# Patient Record
Sex: Female | Born: 1935 | Race: Black or African American | Hispanic: No | Marital: Married | Smoking: Former smoker
Health system: Southern US, Community
[De-identification: ages and names within clinical notes are randomized; demographics above are authoritative.]

## PROBLEM LIST (undated history)

## (undated) DIAGNOSIS — D649 Anemia, unspecified: Secondary | ICD-10-CM

## (undated) DIAGNOSIS — I10 Essential (primary) hypertension: Secondary | ICD-10-CM

## (undated) DIAGNOSIS — E119 Type 2 diabetes mellitus without complications: Secondary | ICD-10-CM

## (undated) DIAGNOSIS — E039 Hypothyroidism, unspecified: Secondary | ICD-10-CM

## (undated) DIAGNOSIS — R002 Palpitations: Secondary | ICD-10-CM

## (undated) DIAGNOSIS — K219 Gastro-esophageal reflux disease without esophagitis: Secondary | ICD-10-CM

## (undated) DIAGNOSIS — K279 Peptic ulcer, site unspecified, unspecified as acute or chronic, without hemorrhage or perforation: Secondary | ICD-10-CM

## (undated) DIAGNOSIS — M109 Gout, unspecified: Secondary | ICD-10-CM

## (undated) HISTORY — PX: BUNIONECTOMY: SHX129

## (undated) HISTORY — PX: THROAT SURGERY: SHX803

## (undated) HISTORY — PX: THYROIDECTOMY: SHX17

## (undated) HISTORY — DX: Palpitations: R00.2

## (undated) HISTORY — DX: Anemia, unspecified: D64.9

## (undated) HISTORY — PX: OTHER SURGICAL HISTORY: SHX169

## (undated) HISTORY — DX: Gastro-esophageal reflux disease without esophagitis: K21.9

## (undated) HISTORY — PX: HEMORRHOID SURGERY: SHX153

## (undated) HISTORY — DX: Gout, unspecified: M10.9

## (undated) HISTORY — DX: Type 2 diabetes mellitus without complications: E11.9

## (undated) HISTORY — PX: UPPER GASTROINTESTINAL ENDOSCOPY: SHX188

## (undated) HISTORY — DX: Peptic ulcer, site unspecified, unspecified as acute or chronic, without hemorrhage or perforation: K27.9

## (undated) HISTORY — DX: Hypothyroidism, unspecified: E03.9

## (undated) HISTORY — PX: OVARIAN CYST REMOVAL: SHX89

---

## 1999-03-17 ENCOUNTER — Emergency Department (HOSPITAL_COMMUNITY): Admission: EM | Admit: 1999-03-17 | Discharge: 1999-03-17 | Payer: Self-pay | Admitting: Emergency Medicine

## 1999-06-15 ENCOUNTER — Emergency Department (HOSPITAL_COMMUNITY): Admission: EM | Admit: 1999-06-15 | Discharge: 1999-06-15 | Payer: Self-pay | Admitting: Emergency Medicine

## 1999-12-26 ENCOUNTER — Other Ambulatory Visit: Admission: RE | Admit: 1999-12-26 | Discharge: 1999-12-26 | Payer: Self-pay | Admitting: *Deleted

## 2002-01-08 ENCOUNTER — Encounter: Payer: Self-pay | Admitting: Orthopedic Surgery

## 2002-01-08 ENCOUNTER — Encounter: Admission: RE | Admit: 2002-01-08 | Discharge: 2002-01-08 | Payer: Self-pay | Admitting: Orthopedic Surgery

## 2004-02-23 ENCOUNTER — Inpatient Hospital Stay (HOSPITAL_COMMUNITY): Admission: EM | Admit: 2004-02-23 | Discharge: 2004-02-27 | Payer: Self-pay | Admitting: Emergency Medicine

## 2005-05-22 ENCOUNTER — Encounter: Admission: RE | Admit: 2005-05-22 | Discharge: 2005-05-22 | Payer: Self-pay | Admitting: Internal Medicine

## 2005-06-21 ENCOUNTER — Encounter
Admission: RE | Admit: 2005-06-21 | Discharge: 2005-07-24 | Payer: Self-pay | Admitting: Physical Medicine and Rehabilitation

## 2006-04-10 ENCOUNTER — Emergency Department (HOSPITAL_COMMUNITY): Admission: EM | Admit: 2006-04-10 | Discharge: 2006-04-10 | Payer: Self-pay | Admitting: Emergency Medicine

## 2007-02-07 ENCOUNTER — Emergency Department (HOSPITAL_COMMUNITY): Admission: EM | Admit: 2007-02-07 | Discharge: 2007-02-07 | Payer: Self-pay | Admitting: *Deleted

## 2007-04-14 ENCOUNTER — Emergency Department (HOSPITAL_COMMUNITY): Admission: EM | Admit: 2007-04-14 | Discharge: 2007-04-14 | Payer: Self-pay | Admitting: Emergency Medicine

## 2007-06-16 ENCOUNTER — Encounter: Admission: RE | Admit: 2007-06-16 | Discharge: 2007-06-16 | Payer: Self-pay | Admitting: Internal Medicine

## 2009-03-25 ENCOUNTER — Observation Stay (HOSPITAL_COMMUNITY): Admission: EM | Admit: 2009-03-25 | Discharge: 2009-03-26 | Payer: Self-pay | Admitting: Emergency Medicine

## 2009-05-10 ENCOUNTER — Encounter: Admission: RE | Admit: 2009-05-10 | Discharge: 2009-06-16 | Payer: Self-pay | Admitting: Internal Medicine

## 2009-11-15 ENCOUNTER — Encounter: Admission: RE | Admit: 2009-11-15 | Discharge: 2009-11-15 | Payer: Self-pay | Admitting: Internal Medicine

## 2009-11-22 ENCOUNTER — Ambulatory Visit: Payer: Self-pay | Admitting: Gynecology

## 2009-11-22 ENCOUNTER — Other Ambulatory Visit: Admission: RE | Admit: 2009-11-22 | Discharge: 2009-11-22 | Payer: Self-pay | Admitting: Gynecology

## 2009-11-29 ENCOUNTER — Ambulatory Visit: Payer: Self-pay | Admitting: Gynecology

## 2010-05-10 ENCOUNTER — Emergency Department (HOSPITAL_COMMUNITY): Admission: EM | Admit: 2010-05-10 | Discharge: 2010-05-11 | Payer: Self-pay | Admitting: Emergency Medicine

## 2010-06-29 ENCOUNTER — Emergency Department (HOSPITAL_COMMUNITY): Admission: EM | Admit: 2010-06-29 | Discharge: 2010-06-29 | Payer: Self-pay | Admitting: Emergency Medicine

## 2011-01-15 LAB — DIFFERENTIAL
Basophils Absolute: 0 10*3/uL (ref 0.0–0.1)
Basophils Relative: 0 % (ref 0–1)
Eosinophils Absolute: 0.1 10*3/uL (ref 0.0–0.7)
Eosinophils Relative: 1 % (ref 0–5)
Lymphocytes Relative: 8 % — ABNORMAL LOW (ref 12–46)
Monocytes Absolute: 0.1 10*3/uL (ref 0.1–1.0)

## 2011-01-15 LAB — CBC
HCT: 41.3 % (ref 36.0–46.0)
Hemoglobin: 14.2 g/dL (ref 12.0–15.0)
MCHC: 34.3 g/dL (ref 30.0–36.0)
MCV: 88.2 fL (ref 78.0–100.0)
Platelets: 220 10*3/uL (ref 150–400)
RDW: 14.5 % (ref 11.5–15.5)

## 2011-01-15 LAB — BASIC METABOLIC PANEL
BUN: 17 mg/dL (ref 6–23)
CO2: 23 mEq/L (ref 19–32)
Glucose, Bld: 100 mg/dL — ABNORMAL HIGH (ref 70–99)
Potassium: 4.3 mEq/L (ref 3.5–5.1)
Sodium: 143 mEq/L (ref 135–145)

## 2011-02-20 NOTE — Discharge Summary (Signed)
NAME:  Julie [name], Julie [name]                 ACCOUNT NO.:  [id number]   MEDICAL RECORD NO.:  [id number]          PATIENT TYPE:  OBV   LOCATION:  1312                         FACILITY:  [facility]   PHYSICIAN:  Massie Maroon, MD        DATE OF BIRTH:  24-Aug-1935   DATE OF ADMISSION:  03/25/2009  DATE OF DISCHARGE:  03/26/2009                               DISCHARGE SUMMARY   DISCHARGE DIAGNOSES:  1. 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  2. Hypertension.  3. Hypothyroidism.  4. Gout/hyperuricemia.  5. Vitamin D deficiency.  6. Asthma.  7. Peripheral edema.  8. Osteoarthritis.  9. Degenerative joint disease.  10.History of abnormal liver function tests likely secondary to Amrix.   ALLERGIES:  ACE INHIBITORS CAUSE TONGUE SWELLING, ANGIOEDEMA.  PROCARDIA  CAUSES NERVOUSNESS.  FISH CAUSES CONGESTION AND SWELLING.  AMRIX CAUSES  CHANGE IN URINE COLOR, LIVER FUNCTION ABNORMALITIES.  ADVAIR CAUSES  BLURRED VISION.   DISCHARGE MEDICATIONS:  1. Hydrochlorothiazide 12.5 mg p.o. daily.  2. Amlodipine 5 mg p.o. daily.  3. Vitamin D 50,000 international units p.o. q.week.  4. Tramadol 50 mg p.o. b.i.d. p.r.n. pain.  5. Indomethacin 50 mg p.o. t.i.d. p.r.n. pain.  6. Ventolin HFA 2 puffs q.6 hours p.r.n. wheezing or shortness of      breath.   HOSPITAL COURSE:  A 76 year old female presented with chief complaint of  tongue swelling starting 30 minutes prior to coming to the ED.  She  notes that she had been taking amlodipine, benazepril starting about 5  days ago.  She had previously been on Exforge.  On recollection she  recalled that her former PCP had told her not to take lisinopril.  However she did not recall this until her tongue started swelling.  She  denied any shellfish or seafood or strawberry eating.  She can not  attribute her tongue swelling to any other medications.  The patient  noticed some slight shortness of breath initially but did not  show  obvious signs of stridor.  She was started on Solu-Medrol.  This was  later increased to 80 mg IV q.6 and Benadryl was also used to help  reduce the tongue swelling.  This morning she appears stable.  Her  tongue swelling is almost receded to normal.  The patient will be placed  on prednisone taper.  She will be discharged home.  She will be  discharged on hydrochlorothiazide as well as amlodipine to control her  blood pressure and she is to check her blood pressure daily.   Past medical history, past surgical history, family history, social  history:  Please see HPI on March 25, 2009.   PHYSICAL EXAMINATION:  Temperature 98, pulse 114, repeat pulse 90, blood  pressure 158/98, repeat blood pressure manually 145/90, pulse ox 96% on  room air.  HEENT:  Anicteric, EOMI, no nystagmus, pupils 1.5-mm and symmetric,  direct __________ reflex intact.  Mucous membranes moist, the swelling  almost down to normal, much reduced in size.  NECK:  No JVD, no stridor,  no bruit, no thyromegaly, no adenopathy.  HEART:  Regular rate and rhythm, S1 - S2.  No murmurs, gallops or rubs.  LUNGS:  Clear to auscultation bilaterally.  ABDOMEN:  Soft, obese, nontender, nondistended, positive bowel sounds.  EXTREMITIES:  No cyanosis, clubbing or edema.  NEURO:  Nonfocal.  SKIN:  No rashes.  LYMPH NODES:  No adenopathy.  PSYCH:  Alert and oriented x3.   LABS:  Sodium 143, potassium 4.3, chloride 113, bicarb 23, BUN 17,  creatinine 1.12.   WBC 8.6, hemoglobin 14.2, platelet count 220.   ASSESSMENT:  1. Angioedema secondary to angiotensin converting enzyme inhibitors.  2. Hypertension.  3. Hypothyroidism.  4. Hyperuricemia.   PLAN:  The patient will be placed on prednisone taper 60 mg p.o. daily  x2 days, then 50 mg p.o. daily x2 days, and then 40 mg p.o. daily x2  days, then 30 mg p.o. daily x2 days, and then 20 mg p.o. daily x2 days,  and then 10 mg p.o. daily x2 days.  No more ACE inhibitors.  The  patient  can be placed on Norvasc 5 mg p.o. daily along with hydrochlorothiazide  12.5 mg p.o. daily for blood pressure control.  The patient should check  her blood pressure daily and she will contact her PCP if it is not  controlled.  She will follow up with her PCP in 2 - 4 weeks.      Massie Maroon, MD  Electronically Signed     JYK/MEDQ  D:  03/26/2009  T:  03/26/2009  Job:  [id number]   cc:   Primary Care Diarra Ceja

## 2011-02-20 NOTE — H&P (Signed)
NAME:  Julie [name], Julie [name] NO.:  [id number]   MEDICAL RECORD NO.:  [id number]          PATIENT TYPE:  EMS   LOCATION:  ED                           FACILITY:  [facility]   PHYSICIAN:  Massie Maroon, MD        DATE OF BIRTH:  1935-02-23   DATE OF ADMISSION:  03/25/2009  DATE OF DISCHARGE:                              HISTORY & PHYSICAL   PRIMARY CARE PHYSICIAN:  Massie Maroon, MD.   CHIEF COMPLAINT:  Tongue swelling.   HISTORY OF PRESENT ILLNESS:  A 76 year old female with a history of  hypertension, hypothyroidism, complains of tongue swelling starting 30  minutes prior to coming to the emergency room.  The patient notes that  she started amlodipine/benazepril earlier this week due to cost  considerations.  She was previously on Exforge.  The patient denies any  shellfish or seafood or strawberry-eating.  She cannot attribute any  other medication changes to her tongue swelling.  The patient noted some  slight shortness of breath but does not show any obvious signs of  stridor.  The patient will be admitted for observation due to tongue  swelling/angioedema.   PAST MEDICAL HISTORY:  1. Hypertension.  2. Hypothyroidism.  3. Vertigo.  4. Gout.  5. Degenerative with disease.  6. Osteoarthritis.  7. Peripheral edema.  8. Asthma.  9. Hyperuricemia.  10.Vitamin D deficiency.  11.Abnormal liver function tests.   PAST SURGICAL HISTORY:  In 1997 left total knee replacement.  Thyroidectomy for cold thyroid nodule. Late 1980s bilateral foot surgery  for bunion removal and hammer toes, left wrist ganglion cyst removal. I  and D of infected for furuncle on her sacrum in the 1990s by Dr. Earlene Plater.  Right ovarian cyst removal at the age of 32.   FAMILY HISTORY:  Father died at age 58 of unknown causes.  Mother died  at age 35 and had diabetes, heart trouble, hypertension and asthma.  Siblings:  She has 1 brother in good health.  One sister with diabetes,  hypertension, and kidney  disease.  The patient is married and has no  children.  Maternal great aunt had diabetes and a cousin with breast  cancer.  Several other family members had heart disease.   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  The patient does not smoke or drink.   ALLERGIES:  PROCARDIA causes nervousness.  FISH causes congestion and  swelling. __________ causes change in urine color ? liver function  abnormalities. ADVAIR causes blurred vision, and BENAZEPRIL/AMLODIPINE  causes tongue swelling.   MEDICATIONS:  Amlodipine/benazepril 5/20 mg 1 p.o. daily, tramadol 50 mg  p.o. b.i.d. for arthritis pain, indomethacin 50 mg p.o. t.i.d. p.r.n.  gout. Vitamin D 50,000 international units weekly. Synthroid 50 mcg p.o.  daily. Omega-3 fish oil 1-3 daily. Celery seed 2-3 cups daily. Benadryl  25 mg 1-2 daily p.r.n. (The patient was previously previous on Exforge  10/160 mg p.o. daily with good blood pressure control.)   PHYSICAL EXAMINATION:  VITAL SIGNS:  Temperature 98, pulse 75, blood  pressure 148/87, pulse ox 96% on room air.  HEENT: Anicteric, marked tongue swelling.  Mucous membranes moist.  Tongue midline.  NECK:  No JVD, no bruit, no thyromegaly, no stridor. Evidence of prior  thyroidectomy scar.  HEART:  Regular rate and rhythm.  S1-S2. No murmurs, gallops or rubs.  LUNGS: Clear to auscultation bilaterally.  ABDOMEN: Soft, nontender, nondistended.  Positive bowel sounds.  EXTREMITIES: No cyanosis, clubbing or edema.  NEUROLOGICAL:  Nonfocal, cranial nerves II-XII intact, reflexes 2+,  symmetric, diffuse with downgoing toes bilaterally. Motor strength 5/5  in all 4 extremities, pinprick intact.   LABS:  WBC 8.6, hemoglobin 14.2, platelet count 220.  Sodium 143,  potassium 4.3, chloride 113, bicarb 23, BUN 17, creatinine 1.12.   ASSESSMENT:  1. Angioedema:  We will use Solu-Medrol 80 mg IV q.8 h. and then      convert over to prednisone  taper.  She does not appear to have any      evidence of stridor or airway compromise.  We will admit her to the      medical floor.  2. Hypertension:  We will discontinue amlodipine/benazepril.  We have      placed into her chart a warning that __________ cause angioedema.      For blood pressure control we will continue with and amlodipine and      consider readmission of Diovan at a later time.  3. Hypothyroidism:  The patient will continue on Synthroid 50 mcg p.o.      daily.  4. Hyperuricemia/osteoarthritis:  We will use tramadol 50 mg p.o.      b.i.d. p.r.n. for pain control.  5. Deep vein thrombosis prophylaxis:  SCDs plus TEDs.      Massie Maroon, MD  Electronically Signed     JYK/MEDQ  D:  03/25/2009  T:  03/25/2009  Job:  [id number]   cc:   Massie Maroon, MD  Fax: [phone]

## 2011-02-23 NOTE — Discharge Summary (Signed)
NAME:  Julie [name], Julie [name] NO.:  [id number]   MEDICAL RECORD NO.:  [id number]                   PATIENT TYPE:  INP   LOCATION:  0346                                 FACILITY:  [facility]   PHYSICIAN:  Elliot Cousin, M.D.                 DATE OF BIRTH:  [birth year]   DATE OF ADMISSION:  02/23/2004  DATE OF DISCHARGE:                                 DISCHARGE SUMMARY   DISCHARGE DIAGNOSES:  1. Gastrointestinal bleed.     a. Esophagogastroduodenoscopy on Feb 24, 2004 per Dr. Virginia Rochester was        unremarkable.     b. Colonoscopy on Feb 24, 2004 per Dr. Virginia Rochester revealed blood in the colon,        mostly in the sigmoid colon, with some small blood clots.  No active        bleeding site seen.  The etiology of the patient's bleeding is really        undetermined at this time.  2. Normocytic anemia.  Hemoglobin on admission 13.1; hemoglobin at the time     of discharge 11.7.   SECONDARY DISCHARGE DIAGNOSES:  1. Hypertension.  2. Hypothyroidism.  3. Gout.  4. Degenerative joint disease primarily of the lower back, right-greater-     than-left hip, and right-greater-than-left shoulder.  5. Status post left total knee replacement in 1997.  6. Status post right ovarian cyst removal at the age of 76 years old.  7. Status post thyroidectomy in the late 1980s.  8. Status post bilateral foot surgery for bunion removal and hammertoes.  9. Status post left wrist cyst excision several years ago.  10.      Status post incision and drainage of an infected furuncle on her     sacrum in the 1990s per Dr. Earlene Plater.  11.      Mild peripheral edema.   DISCHARGE MEDICATIONS:  1. Anusol HC suppository one at bedtime.  2. Niferex 150 mg daily.  3. Norvasc 10 mg daily.  4. Diovan 80 mg daily.  5. Darvocet-N 100 one to two tablets q.6h. as needed for pain.  6. Allopurinol 100 mg b.i.d.  7. Furosemide 20 mg daily.  8. Synthroid 50 mcg daily.  9. AVOID ALL NONSTEROIDAL ANTIINFLAMMATORY  DRUGS.   DISCHARGE DISPOSITION:  The patient was discharged to home on Feb 27, 2004  in improved and stable condition.  She was advised to follow up with her  primary care physician, Dr. Lendell Caprice, in 3-5 days.  Will advise a repeat of  her hemoglobin and hematocrit at the hospital follow-up appointment.  She  will follow up with Dr. Virginia Rochester as needed and per Dr. Pincus Sanes discretion.   PROCEDURES PERFORMED:  EGD and colonoscopy on Feb 24, 2004 per Dr. Virginia Rochester.  The  results as above.   CONSULTATIONS:  Sabino Gasser, M.D.   HISTORY OF PRESENT ILLNESS:  [name] is a 76 year old lady with a past  medical history significant for hypertension and degenerative joint disease  as well as hypothyroidism who presented to the emergency department on Feb 23, 2004 with an episode of rectal bleeding.  The patient takes Aleve  several times a day for arthritic pain in her back and shoulders.  She  denied any abdominal pain, nausea, vomiting, or hematemesis.  When she was  examined by the emergency department physician, Dr. Freida Busman, he noted that the  patient had gross maroon-colored and bright-red-colored stool on his  examining glove.  The patient was therefore admitted for further evaluation  and management.   HOSPITAL COURSE:  GASTROINTESTINAL BLEED.  The patient's hemoglobin on  admission was 13.1, hematocrit was 40.2, and MCV was 85.9.  The patient was  hemodynamically stable with a blood pressure on admission of 142/92.  She  was oxygenating 97% on room air.  The initial management included gentle  volume repletion with normal saline at 50 mL/hour, Protonix 40 mg daily, and  discontinuing all NSAIDs including Aleve.  The patient's PT was within  normal limits at 12.4 and the PTT was within normal limits at 26.  The  patient was typed and screened for 1 unit of packed red blood cells.  The  patient's hemoglobin and hematocrit were followed daily.  Dr. Virginia Rochester, the  gastroenterologist, was consulted for  further evaluation and management.  Dr. Virginia Rochester subsequently performed an EGD and colonoscopy on Feb 24, 2004.  The  EGD was unremarkable.  However, the colonoscopy revealed blood in the colon,  mostly in the sigmoid colon, with some blood clots but no active bleeding  site was seen.  The etiology of the patient's bleeding was indeterminate.  It may or may not have been internal hemorrhoids per Dr. Virginia Rochester.  Following the  colonoscopy, a bleeding scan was considered if the patient's rectal bleeding  worsened or did not improve.  Over the course of the hospitalization the  patient's hemoglobin fell from 13.1 to 12.2 the following day, to 11.5 the  following day, to 10.5 on yesterday.  The patient continued to have small  bowel movements but only scant amount of frank blood or maroon-colored blood  that was visible in her stool.  On today, her hemoglobin is 11.7.  She had  two small bowel movements this morning with only a scant amount of maroon-  colored blood.  The patient currently is hemodynamically stable with a blood  pressure of 143/83.  Given that the bleeding is substantially slowing down  and given that her hemoglobin has remained stable over the past 24 hours,  the patient will be discharged to home.   The patient was advised to avoid all NSAIDs.  She was advised to take  Darvocet as needed for arthritic pain.  She was advised to follow up with  Dr. Lendell Caprice in 3-5 days to repeat an assessment of the hemoglobin and  hematocrit.  She was advised to come back to the emergency department if an  increase in rectal bleeding occurred.  She will follow up with Dr. Virginia Rochester as  needed.                                               Elliot Cousin, M.D.    DF/MEDQ  D:  02/27/2004  T:  02/28/2004  Job:  [id number]   cc:   Janae Bridgeman. Eloise Harman., M.D.  [address]  [place]  [place] [address]  Fax: [phone]   Georgiana Spinner, M.D.  [address] [place]  [place] [address]   Fax: [phone]

## 2011-02-23 NOTE — Op Note (Signed)
NAME:  MODEST, [name] NO.:  [id number]   MEDICAL RECORD NO.:  [id number]                   PATIENT TYPE:  INP   LOCATION:  0346                                 FACILITY:  [facility]   PHYSICIAN:  Georgiana Spinner, M.D.                 DATE OF BIRTH:  [birth year]   DATE OF PROCEDURE:  DATE OF DISCHARGE:                                 OPERATIVE REPORT   PROCEDURE:  Colonoscopy.   ENDOSCOPIST:  Georgiana Spinner, M.D.   INDICATIONS:  Rectal bleeding.   ANESTHESIA:  Demerol 20 mg, Versed 2 mg.   PROCEDURE:  With the patient mildly sedated in the left lateral decubitus  position, the Olympus videoscopic colonoscope was inserted in the rectum and  passed under direct vision to the cecum.  It was noted that in the sigmoid  colon area, there was dilute red blood that ended at approximately 5 cm from  the anal verge and the proximal portion of the colon had absolutely no blood  or blood-staining seen.  The cecum was identified by the ileocecal valve and  appendiceal orifice, both of which were photographed.  From this point, the  colonoscope was slowly withdrawn, taking circumferential views of the  colonic mucosa, stopping at approximately 50 and then subsequently distal to  approximately 20 cm from the anal verge as we washed and suctioned blood  from this area, looking for an active bleeding site, none of which was seen.  There were no diverticula appreciated on this examination.  There were no  AVMs, certainly no polyps or other mass lesions.  The endoscope was then  withdrawn back to the rectum, which appeared normal on direct and showed  hemorrhoids on retroflexed view which were not bleeding.  The endoscope was  straightened and pulled back through the anal canal; external hemorrhoids  were noted, again no active bleeding seen here.   IMPRESSION:  Blood in the colon, mostly in the sigmoid colon, with some  small clots, no active bleeding site seen.  The  etiology of the patient's  bleeding is really undetermined at this time, but would follow clinically.  Consider bleeding scan if the patient should have repeat clinical bleeding.      ___________________________________________                                            Georgiana Spinner, M.D.   GMO/MEDQ  D:  02/24/2004  T:  02/25/2004  Job:  [id number]

## 2011-02-23 NOTE — H&P (Signed)
NAME:  Julie [name], SACCO NO.:  [id number]   MEDICAL RECORD NO.:  [id number]                   PATIENT TYPE:  EMS   LOCATION:  ED                                   FACILITY:  [facility]   PHYSICIAN:  Elliot Cousin, M.D.                 DATE OF BIRTH:  01/30/1935   DATE OF ADMISSION:  02/23/2004  DATE OF DISCHARGE:                                HISTORY & PHYSICAL   PRIMARY CARE PHYSICIAN:  Dr. Dimas Alexandria.   CHIEF COMPLAINT:  Rectal bleeding.   HISTORY OF PRESENT ILLNESS:  Julie [name] is a 76 year old lady with a past  medical history significant for hypertension, hypothyroidism, and  degenerative joint disease who presented to the emergency department this  morning with an episode of rectal bleeding.  The patient states that she got  up this morning at approximately 3 a.m. to use the bathroom.  When she wiped  her rectum she saw blood on the toilet paper.  She then looked into the  toilet water and saw a bowl full of blood mixed in with water.  There was a  scant amount of stool.  The patient had a bowel movement earlier in the day  without any evidence of bright red blood per rectum or maroon-colored  stools.  The patient says this is the first time that she has had rectal  bleeding.  She has not had any recent constipation, abdominal pain, or  diarrhea.  Occasionally she will have some burping, heartburn, and nausea.  She has not had any vomiting.  She has not had any painful urination.  She  has not had any blood in her urine or abnormal uterine bleeding.  She denies  any alcohol use; however, she has been taking more Aleve lately,  approximately three to four pills a day for the past few weeks, primarily  for arthritic pain in her back and shoulders.   When the patient was evaluated by emergency department physician Dr. Freida Busman,  per his rectal exam, the patient had gross maroon-colored stool on his  examining glove.  Her hemoglobin in the emergency  department was 13.1.  The  patient will be admitted for further evaluation and management.   PAST MEDICAL HISTORY:  1. Hypertension.  2. Hypothyroidism.  3. Gout.  4. Degenerative joint disease, primarily of the lower back, right-greater-     than-left hip, and right-greater-than-left shoulder.  5. Status post left total knee replacement in 1997.  6. Status post right ovarian cyst removal at the age of 76 years old.  7. Status post thyroidectomy in the late 1980s.  8. Status post bilateral foot surgery for bunion removal and hammertoes.  9. Status post left wrist cyst excision several years ago.  10.      Status post I&D of an infected furuncle on her sacrum in the 1990s     per Dr.  Davis.  11.      Mild peripheral edema.   MEDICATIONS:  1. Norvasc 10 mg daily.  2. Diovan 80 mg daily.  3. Darvocet one to two tablets q.6h. p.r.n. pain.  4. Aleve one to two tablets q.4-6h. as needed for pain.  5. Allopurinol 100 mg b.i.d.  6. Furosemide 20 mg daily.  7. Synthroid 50 mcg daily.   ALLERGIES:  No known drug allergies.   SOCIAL HISTORY:  The patient is married and lives in [place].  She has no  children.  She is retired.  She does not smoke, drink alcohol, or use  illicit drugs.  She can read and write.   FAMILY HISTORY:  Her father died at 29 years of age, cause unknown.  Her  mother died at 65 years of age, cause unknown; however, her mother did have  diabetes mellitus.   REVIEW OF SYSTEMS:  As above in the history of present illness.  In  addition, the patient denies any headache, weight loss, blackout, visual  changes, difficulty swallowing, chest pain, shortness of breath, abdominal  pain.   PHYSICAL EXAMINATION:  VITAL SIGNS:  Temperature 97.5, blood pressure  146/92, pulse 87, respiratory rate 18, oxygen saturation 97%.  GENERAL:  The patient is a pleasant 76 year old obese African-American woman  who is sitting up in bed in no acute distress.  HEENT:  Head is  normocephalic, nontraumatic.  Pupils are equal, round,  reactive to light.  Extraocular movements are intact.  Conjunctivae are  clear, sclerae are white.  Tympanic membranes are clear bilaterally.  Nasal  mucosa is moist, no drainage.  Oropharynx reveals good dentition.  Moist  mucous membranes.  No posterior exudates or erythema.  NECK:  Supple, mildly obese, no thyromegaly, no bruit, no JVD.  There is a  well-healed horizontal scar.  LUNGS:  Clear to auscultation bilaterally.  HEART:  S1, S2 with no murmurs, rubs, or gallops.  ABDOMEN:  Obese, positive bowel sounds, soft, nontender, nondistended, no  hepatosplenomegaly, no masses palpated.  RECTAL:  Per the emergency department physician, Dr. Freida Busman, the patient had  gross maroon-colored blood on the examining glove.  GENITOURINARY:  Deferred.  EXTREMITIES:  No pretibial edema.  No pedal edema.  Pedal pulses bilaterally  are 2+.  The patient has a good range of motion of all of her extremities.  MUSCULOSKELETAL:  Mild tenderness of the lumbosacral muscles bilaterally.  Mild tenderness over the right greater than the left shoulder joints.  Well-  healed scar on the left knee.   ADMISSION LABORATORY DATA:  WBC 8.0, hemoglobin 13.1, hematocrit 40.2, MCV  85.9, platelets 259.  Urinalysis negative.  PT 12.4, INR 0.9, PTT 26.  Sodium 141, potassium 3.9, chloride 109, CO2 24, glucose 107, BUN 19,  creatinine 1.4, calcium 8.9, total protein 7.2, albumin 3.8, AST 20, ALT 12,  alkaline phosphatase 116, total bilirubin 0.5.   ASSESSMENT:  Rectal bleeding.  The patient has had only one episode of  rectal bleeding.  She is currently hemodynamically stable and her hemoglobin  and hematocrit are within normal limits.  The patient will be admitted for  observation for further rectal bleeding.  If rectal bleeding recurs and/or  if she becomes hemodynamically unstable will obtain a GI consult.  The patient has been using more Aleve/NSAIDs lately.   This could be the possible  reason for the rectal bleeding.  Will also need to rule out colon cancer  given the patient's age.   PLAN:  1. The patient has been typed and screened x1 unit.  2. The patient will be admitted for observation.  3. Will monitor bowel movements for further rectal bleeding.  4. Will treat with gentle IV fluids.  Continue antihypertensive for now but     will hold if blood pressure begins to drop.  5. Protonix 40 mg daily.  6. Discontinue the Aleve for now.  7. Will check a baseline EKG.                                               Elliot Cousin, M.D.    DF/MEDQ  D:  02/23/2004  T:  02/23/2004  Job:  [id number]   cc:   Janae Bridgeman. Eloise Harman., M.D.  [address]  [place]  [place] [address]  Fax: [phone]

## 2011-02-23 NOTE — Op Note (Signed)
NAME:  Julie [name], Julie [name] NO.:  [id number]   MEDICAL RECORD NO.:  [id number]                   PATIENT TYPE:  INP   LOCATION:  0346                                 FACILITY:  [facility]   PHYSICIAN:  Georgiana Spinner, M.D.                 DATE OF BIRTH:  Sep 23, 1935   DATE OF PROCEDURE:  DATE OF DISCHARGE:                                 OPERATIVE REPORT   PROCEDURE:  Upper endoscopy.   ENDOSCOPIST:  Georgiana Spinner, M.D.   INDICATIONS:  Rectal bleeding.   ANESTHESIA:  Demerol 40, Versed 4 mg.   DESCRIPTION OF PROCEDURE:  With the patient mildly sedated in the left  lateral decubitus position, the Olympus videoscopic endoscope was inserted  into the mouth and passed under direct vision through the esophagus which  appeared normal.  There was a hiatal hernia noted.  We entered into the  stomach, fundus, body antrum, duodenal bulb, and second portion of the  duodenum were unremarkable.   From this point, the endoscope was slowly withdrawn taking circumferential  views of the duodenal mucosa; visualized until the endoscope was then pulled  back into the stomach and placed in retroflexion to view the stomach from  below.  The endoscope was then straightened and withdrawn taking  circumferential views of the remaining gastric and esophageal mucosa.  The  patient's vital signs and pulse oximetry remained stable.  The patient  tolerated the procedure well without apparent complications.   FINDINGS:  Unremarkable examination.   PLAN:  Proceed to colonoscopy.      ___________________________________________                                            Georgiana Spinner, M.D.   GMO/MEDQ  D:  02/24/2004  T:  02/25/2004  Job:  [id number]

## 2011-09-11 ENCOUNTER — Emergency Department (HOSPITAL_COMMUNITY)
Admission: EM | Admit: 2011-09-11 | Discharge: 2011-09-11 | Disposition: A | Discharge status: Home / Self Care | Payer: Medicare Other | Attending: Emergency Medicine | Admitting: Emergency Medicine

## 2011-09-11 ENCOUNTER — Emergency Department (HOSPITAL_COMMUNITY): Payer: Medicare Other

## 2011-09-11 ENCOUNTER — Encounter: Payer: Self-pay | Admitting: Emergency Medicine

## 2011-09-11 DIAGNOSIS — R059 Cough, unspecified: Secondary | ICD-10-CM | POA: Insufficient documentation

## 2011-09-11 DIAGNOSIS — R0602 Shortness of breath: Secondary | ICD-10-CM | POA: Insufficient documentation

## 2011-09-11 DIAGNOSIS — H1130 Conjunctival hemorrhage, unspecified eye: Secondary | ICD-10-CM | POA: Insufficient documentation

## 2011-09-11 DIAGNOSIS — J40 Bronchitis, not specified as acute or chronic: Secondary | ICD-10-CM

## 2011-09-11 DIAGNOSIS — R05 Cough: Secondary | ICD-10-CM

## 2011-09-11 DIAGNOSIS — R42 Dizziness and giddiness: Secondary | ICD-10-CM | POA: Insufficient documentation

## 2011-09-11 HISTORY — DX: Essential (primary) hypertension: I10

## 2011-09-11 LAB — URINALYSIS, ROUTINE W REFLEX MICROSCOPIC
Bilirubin Urine: NEGATIVE
Glucose, UA: NEGATIVE mg/dL
Hgb urine dipstick: NEGATIVE
Ketones, ur: NEGATIVE mg/dL
Leukocytes, UA: NEGATIVE
Nitrite: NEGATIVE
Protein, ur: NEGATIVE mg/dL
Specific Gravity, Urine: 1.014 (ref 1.005–1.030)
Urobilinogen, UA: 0.2 mg/dL (ref 0.0–1.0)
pH: 5.5 (ref 5.0–8.0)

## 2011-09-11 LAB — POCT I-STAT, CHEM 8
Calcium, Ion: 1.15 mmol/L (ref 1.12–1.32)
Chloride: 109 mEq/L (ref 96–112)
Glucose, Bld: 92 mg/dL (ref 70–99)
HCT: 35 % — ABNORMAL LOW (ref 36.0–46.0)
TCO2: 26 mmol/L (ref 0–100)

## 2011-09-11 MED ORDER — SODIUM CHLORIDE 0.9 % IV BOLUS (SEPSIS): 500.0000 mL | Freq: Once | INTRAVENOUS | Status: DC

## 2011-09-11 MED ORDER — AMOXICILLIN 250 MG PO CAPS: 250.0000 mg | ORAL_CAPSULE | Freq: Two times a day (BID) | ORAL | Status: AC

## 2011-09-11 NOTE — ED Provider Notes (Signed)
History     CSN: [id number] Arrival date & time: 09/11/2011  6:38 AM   First MD Initiated Contact with Patient 09/11/11 [phone]      Chief Complaint  Patient presents with  . Dizziness  . URI    (Consider location/radiation/quality/duration/timing/severity/associated sxs/prior treatment) HPI Pt presented to the ER with c/o cold and dizziness, vertigo, states started on Tuesday after grandkids visit, both kids were coughing in her face, pt started to fee sick since then. Pt denies chills, fever, but states she is coughing a lot.  Patient denies chest pain.  Patient denies headache.  Has had a long history of episodes of vertigo.  Past Medical History  Diagnosis Date  . Hypertension   . Asthma     Past Surgical History  Procedure Date  . Ovarina cyst removal     ovarian cyst removal  . Foot surgery     History reviewed. No pertinent family history.  History  Substance Use Topics  . Smoking status: Former Games developer  . Smokeless tobacco: Not on file  . Alcohol Use: No    OB History    Grav Para Term Preterm Abortions TAB SAB Ect Mult Living                  Review of Systems Of systems negative except as noted in history of present illness Allergies  Aspirin  Home Medications  No current outpatient prescriptions on file.  BP 135/92  Pulse 91  Temp 98.9 F (37.2 C)  Resp 20  SpO2 94%  Physical Exam  Nursing note and vitals reviewed. Constitutional: She is oriented to person, place, and time. She appears well-developed and well-nourished. No distress.  HENT:  Head: Normocephalic and atraumatic.  Eyes: Pupils are equal, round, and reactive to light. Right conjunctiva has a hemorrhage.    Neck: Normal range of motion.  Cardiovascular: Normal rate and intact distal pulses.   Pulmonary/Chest: Effort normal. No respiratory distress. She has no wheezes. She has no rales.  Abdominal: Normal appearance. She exhibits no distension.  Musculoskeletal: Normal range of  motion.  Neurological: She is alert and oriented to person, place, and time. No cranial nerve deficit.  Skin: Skin is warm and dry. No rash noted.  Psychiatric: She has a normal mood and affect. Her behavior is normal.    ED Course  Procedures (including critical care time)   Labs Reviewed  I-STAT, CHEM 8   Dg Chest 2 View  09/11/2011  *RADIOLOGY REPORT*  Clinical Data: Cough, vertigo, shortness of breath  CHEST - 2 VIEW  Comparison: Chest x-ray of 04/10/2006  Findings: The lungs are clear.  Mediastinal contours appear normal. The heart is within upper limits of normal.  No bony abnormality is seen with degenerative change throughout the thoracic spine. Surgical clips are present in the lower neck possibly due to prior thyroid surgery.  IMPRESSION:  Stable chest x-ray.  No active lung disease.  Heart upper normal.  Original Report Authenticated By: Juline Patch, M.D.     1. Cough   2. Bronchitis       MDM          Nelia Shi, MD 09/11/11 [phone]

## 2011-09-11 NOTE — ED Notes (Signed)
Pt in radiology 

## 2011-09-11 NOTE — ED Notes (Signed)
Pt presented to the ER with c/o cold and dizziness, vertigo, states started on Tuesday after grandkids visit, both kids were coughing in her face, pt started to fee sick since then.  Pt denies chills, fever, but states she is coughing a lot, (right eye with few burst blood vessels)

## 2012-04-15 ENCOUNTER — Encounter (HOSPITAL_COMMUNITY): Payer: Self-pay | Admitting: Emergency Medicine

## 2012-04-15 ENCOUNTER — Emergency Department (HOSPITAL_COMMUNITY)
Admission: EM | Admit: 2012-04-15 | Discharge: 2012-04-15 | Disposition: A | Discharge status: Home / Self Care | Payer: Medicare Other | Attending: Emergency Medicine | Admitting: Emergency Medicine

## 2012-04-15 DIAGNOSIS — J45909 Unspecified asthma, uncomplicated: Secondary | ICD-10-CM | POA: Insufficient documentation

## 2012-04-15 DIAGNOSIS — I10 Essential (primary) hypertension: Secondary | ICD-10-CM | POA: Insufficient documentation

## 2012-04-15 LAB — POCT I-STAT, CHEM 8
BUN: 20 mg/dL (ref 6–23)
Calcium, Ion: 1.18 mmol/L (ref 1.13–1.30)
Creatinine, Ser: 1.3 mg/dL — ABNORMAL HIGH (ref 0.50–1.10)
Hemoglobin: 13.3 g/dL (ref 12.0–15.0)
TCO2: 25 mmol/L (ref 0–100)

## 2012-04-15 NOTE — ED Provider Notes (Signed)
History     CSN: [id number]  Arrival date & time 04/15/12  1105   First MD Initiated Contact with Patient 04/15/12 1113      Chief Complaint  Patient presents with  . Hypertension    (Consider location/radiation/quality/duration/timing/severity/associated sxs/prior treatment) Patient is a 77 y.o. female presenting with hypertension. The history is provided by the patient.  Hypertension   patient here after having a hypertensive episode just prior to arrival which she treated with by taking an extra dose of her Norvasc. No associated chest pain, dyspnea, diaphoresis. No vomiting or confusion. She feels back to her baseline at this time. She has had trouble with blood pressure control recently  Past Medical History  Diagnosis Date  . Hypertension   . Asthma     Past Surgical History  Procedure Date  . Ovarina cyst removal     ovarian cyst removal  . Foot surgery     No family history on file.  History  Substance Use Topics  . Smoking status: Former Games developer  . Smokeless tobacco: Not on file  . Alcohol Use: No    OB History    Grav Para Term Preterm Abortions TAB SAB Ect Mult Living                  Review of Systems  All other systems reviewed and are negative.    Allergies  Aspirin  Home Medications   Current Outpatient Rx  Name Route Sig Dispense Refill  . AMLODIPINE BESYLATE 10 MG PO TABS Oral Take 10 mg by mouth daily.    . ASPIRIN 81 MG PO TABS Oral Take 81 mg by mouth daily.    Marland Kitchen COLCRYS PO Oral Take 0.6 mg by mouth daily.      Marland Kitchen HYDROCHLOROTHIAZIDE 12.5 MG PO TABS Oral Take 12.5 mg by mouth every morning.      . INDOMETHACIN 50 MG PO CAPS Oral Take 50 mg by mouth daily. gout     . LEVOTHYROXINE SODIUM 50 MCG PO TABS Oral Take 50 mcg by mouth every evening.     Marland Kitchen MECLIZINE HCL 25 MG PO TABS Oral Take 25 mg by mouth every 6 (six) hours as needed. vertigo    . TRAMADOL HCL 50 MG PO TABS Oral Take 50 mg by mouth 2 (two) times daily as needed. Maximum  dose= 8 tablets per day pain       BP 130/73  Pulse 89  Temp 99.1 F (37.3 C) (Oral)  Resp 14  SpO2 97%  Physical Exam  Nursing note and vitals reviewed. Constitutional: She is oriented to person, place, and time. She appears well-developed and well-nourished.  Non-toxic appearance. No distress.  HENT:  Head: Normocephalic and atraumatic.  Eyes: Conjunctivae, EOM and lids are normal. Pupils are equal, round, and reactive to light.  Neck: Normal range of motion. Neck supple. No tracheal deviation present. No mass present.  Cardiovascular: Normal rate, regular rhythm and normal heart sounds.  Exam reveals no gallop.   No murmur heard. Pulmonary/Chest: Effort normal and breath sounds normal. No stridor. No respiratory distress. She has no decreased breath sounds. She has no wheezes. She has no rhonchi. She has no rales.  Abdominal: Soft. Normal appearance and bowel sounds are normal. She exhibits no distension. There is no tenderness. There is no rebound and no CVA tenderness.  Musculoskeletal: Normal range of motion. She exhibits no edema and no tenderness.  Neurological: She is alert and oriented to person, place,  and time. She has normal strength. No cranial nerve deficit or sensory deficit. GCS eye subscore is 4. GCS verbal subscore is 5. GCS motor subscore is 6.  Skin: Skin is warm and dry. No abrasion and no rash noted.  Psychiatric: She has a normal mood and affect. Her speech is normal and behavior is normal.    ED Course  Procedures (including critical care time)  Labs Reviewed - No data to display No results found.   No diagnosis found.    MDM  Blood pressure stable here. She will followup with her Dr.        Toy Baker, MD 04/15/12 1257

## 2012-04-15 NOTE — ED Notes (Signed)
Pt here for c/o HTN bp 154/91 this am denies chest pain sob sweating and n/v took amlodipine 10 mg @1000  some dizziness

## 2013-03-18 ENCOUNTER — Encounter (HOSPITAL_COMMUNITY): Payer: Self-pay

## 2013-03-18 ENCOUNTER — Emergency Department (HOSPITAL_COMMUNITY)
Admission: EM | Admit: 2013-03-18 | Discharge: 2013-03-18 | Disposition: A | Discharge status: Home / Self Care | Payer: Medicare Other | Attending: Emergency Medicine | Admitting: Emergency Medicine

## 2013-03-18 DIAGNOSIS — J45909 Unspecified asthma, uncomplicated: Secondary | ICD-10-CM | POA: Insufficient documentation

## 2013-03-18 DIAGNOSIS — I10 Essential (primary) hypertension: Secondary | ICD-10-CM | POA: Insufficient documentation

## 2013-03-18 DIAGNOSIS — Z7982 Long term (current) use of aspirin: Secondary | ICD-10-CM | POA: Insufficient documentation

## 2013-03-18 DIAGNOSIS — Z79899 Other long term (current) drug therapy: Secondary | ICD-10-CM | POA: Insufficient documentation

## 2013-03-18 DIAGNOSIS — R42 Dizziness and giddiness: Secondary | ICD-10-CM | POA: Insufficient documentation

## 2013-03-18 DIAGNOSIS — Z87891 Personal history of nicotine dependence: Secondary | ICD-10-CM | POA: Insufficient documentation

## 2013-03-18 MED ORDER — LORAZEPAM 1 MG PO TABS
ORAL_TABLET | ORAL | Status: AC
  Administered 2013-03-18: 1 mg
  Filled 2013-03-18: qty 1

## 2013-03-18 MED ORDER — ONDANSETRON 8 MG PO TBDP
ORAL_TABLET | ORAL | Status: AC
  Administered 2013-03-18: 8 mg
  Filled 2013-03-18: qty 1

## 2013-03-18 NOTE — ED Notes (Addendum)
Per EMS, pt with vertigo starting 1 hour ago.  Nausea no vomiting. Pt states she took her med for it (meclizine) when occurred.  Marland Kitchen  Pt from home.  Vitals 154/79, ht 90, resp 18, 99%, CBG 120.

## 2013-03-18 NOTE — ED Notes (Signed)
[id number]<AV> Expected date:<BR> Expected time:<BR> Means of arrival:<BR> Comments:<BR> EMS/78 yo female with vertigo

## 2013-03-18 NOTE — ED Notes (Signed)
Patient stated that she is unable to get in a gown right now due to her feeling sick on the stomach. Will re-check

## 2013-03-30 NOTE — ED Provider Notes (Signed)
   History    CSN: [id number] Arrival date & time 03/18/13  0012  None    Chief Complaint  Patient presents with  . Dizziness   (Consider location/radiation/quality/duration/timing/severity/associated sxs/prior Treatment) HPI Comments: Down time chart  Past Medical History  Diagnosis Date  . Hypertension   . Asthma    Past Surgical History  Procedure Laterality Date  . Ovarina cyst removal      ovarian cyst removal  . Foot surgery     History reviewed. No pertinent family history. History  Substance Use Topics  . Smoking status: Former Games developer  . Smokeless tobacco: Not on file  . Alcohol Use: No   OB History   Grav Para Term Preterm Abortions TAB SAB Ect Mult Living                 Review of Systems  Allergies  Aspirin  Home Medications   Current Outpatient Rx  Name  Route  Sig  Dispense  Refill  . amLODipine (NORVASC) 10 MG tablet   Oral   Take 10 mg by mouth daily.         Marland Kitchen aspirin 81 MG tablet   Oral   Take 81 mg by mouth daily.         . indomethacin (INDOCIN) 50 MG capsule   Oral   Take 50 mg by mouth daily. gout          . levothyroxine (SYNTHROID, LEVOTHROID) 50 MCG tablet   Oral   Take 50 mcg by mouth every evening.          . meclizine (ANTIVERT) 25 MG tablet   Oral   Take 25 mg by mouth every 6 (six) hours as needed. vertigo         . traMADol (ULTRAM) 50 MG tablet   Oral   Take 50 mg by mouth 2 (two) times daily as needed. Maximum dose= 8 tablets per day pain           BP 122/61  Pulse 86  Temp(Src) 97.3 F (36.3 C) (Oral)  Resp 18  SpO2 94% Physical Exam  ED Course  Procedures (including critical care time) Labs Reviewed - No data to display No results found. No diagnosis found.  MDM  Down time chart  Arman Filter, NP 03/30/13 2015

## 2013-04-01 NOTE — ED Provider Notes (Signed)
Medical screening examination/treatment/procedure(s) were performed by non-physician practitioner and as supervising physician I was immediately available for consultation/collaboration.   Sudais Banghart, MD 04/01/13 2340 

## 2015-02-24 DIAGNOSIS — I10 Essential (primary) hypertension: Secondary | ICD-10-CM | POA: Diagnosis not present

## 2015-02-24 DIAGNOSIS — E119 Type 2 diabetes mellitus without complications: Secondary | ICD-10-CM | POA: Diagnosis not present

## 2015-03-03 DIAGNOSIS — M5136 Other intervertebral disc degeneration, lumbar region: Secondary | ICD-10-CM | POA: Diagnosis not present

## 2015-03-03 DIAGNOSIS — M25551 Pain in right hip: Secondary | ICD-10-CM | POA: Diagnosis not present

## 2015-03-03 DIAGNOSIS — E559 Vitamin D deficiency, unspecified: Secondary | ICD-10-CM | POA: Diagnosis not present

## 2015-03-03 DIAGNOSIS — I10 Essential (primary) hypertension: Secondary | ICD-10-CM | POA: Diagnosis not present

## 2015-03-03 DIAGNOSIS — Z0000 Encounter for general adult medical examination without abnormal findings: Secondary | ICD-10-CM | POA: Diagnosis not present

## 2015-03-03 DIAGNOSIS — M1611 Unilateral primary osteoarthritis, right hip: Secondary | ICD-10-CM | POA: Diagnosis not present

## 2015-03-03 DIAGNOSIS — E039 Hypothyroidism, unspecified: Secondary | ICD-10-CM | POA: Diagnosis not present

## 2015-03-29 DIAGNOSIS — Z79899 Other long term (current) drug therapy: Secondary | ICD-10-CM | POA: Diagnosis not present

## 2015-03-29 DIAGNOSIS — M545 Low back pain: Secondary | ICD-10-CM | POA: Diagnosis not present

## 2015-03-29 DIAGNOSIS — M488X6 Other specified spondylopathies, lumbar region: Secondary | ICD-10-CM | POA: Diagnosis not present

## 2015-03-29 DIAGNOSIS — G894 Chronic pain syndrome: Secondary | ICD-10-CM | POA: Diagnosis not present

## 2015-03-29 DIAGNOSIS — M47817 Spondylosis without myelopathy or radiculopathy, lumbosacral region: Secondary | ICD-10-CM | POA: Diagnosis not present

## 2015-03-29 DIAGNOSIS — E669 Obesity, unspecified: Secondary | ICD-10-CM | POA: Diagnosis not present

## 2015-03-29 DIAGNOSIS — M5137 Other intervertebral disc degeneration, lumbosacral region: Secondary | ICD-10-CM | POA: Diagnosis not present

## 2015-04-27 DIAGNOSIS — M1990 Unspecified osteoarthritis, unspecified site: Secondary | ICD-10-CM | POA: Diagnosis not present

## 2015-04-28 DIAGNOSIS — M79609 Pain in unspecified limb: Secondary | ICD-10-CM | POA: Diagnosis not present

## 2015-04-28 DIAGNOSIS — M545 Low back pain: Secondary | ICD-10-CM | POA: Diagnosis not present

## 2015-05-02 DIAGNOSIS — M5137 Other intervertebral disc degeneration, lumbosacral region: Secondary | ICD-10-CM | POA: Diagnosis not present

## 2015-05-30 DIAGNOSIS — M5137 Other intervertebral disc degeneration, lumbosacral region: Secondary | ICD-10-CM | POA: Diagnosis not present

## 2015-06-29 DIAGNOSIS — G894 Chronic pain syndrome: Secondary | ICD-10-CM | POA: Diagnosis not present

## 2015-06-29 DIAGNOSIS — E669 Obesity, unspecified: Secondary | ICD-10-CM | POA: Diagnosis not present

## 2015-06-29 DIAGNOSIS — M488X6 Other specified spondylopathies, lumbar region: Secondary | ICD-10-CM | POA: Diagnosis not present

## 2015-06-29 DIAGNOSIS — M47817 Spondylosis without myelopathy or radiculopathy, lumbosacral region: Secondary | ICD-10-CM | POA: Diagnosis not present

## 2015-06-29 DIAGNOSIS — M5137 Other intervertebral disc degeneration, lumbosacral region: Secondary | ICD-10-CM | POA: Diagnosis not present

## 2015-08-23 DIAGNOSIS — E039 Hypothyroidism, unspecified: Secondary | ICD-10-CM | POA: Diagnosis not present

## 2015-08-23 DIAGNOSIS — M109 Gout, unspecified: Secondary | ICD-10-CM | POA: Diagnosis not present

## 2015-08-23 DIAGNOSIS — I10 Essential (primary) hypertension: Secondary | ICD-10-CM | POA: Diagnosis not present

## 2015-08-23 DIAGNOSIS — E559 Vitamin D deficiency, unspecified: Secondary | ICD-10-CM | POA: Diagnosis not present

## 2015-08-30 DIAGNOSIS — E119 Type 2 diabetes mellitus without complications: Secondary | ICD-10-CM | POA: Diagnosis not present

## 2015-08-30 DIAGNOSIS — E559 Vitamin D deficiency, unspecified: Secondary | ICD-10-CM | POA: Diagnosis not present

## 2015-08-30 DIAGNOSIS — Z23 Encounter for immunization: Secondary | ICD-10-CM | POA: Diagnosis not present

## 2015-08-30 DIAGNOSIS — I10 Essential (primary) hypertension: Secondary | ICD-10-CM | POA: Diagnosis not present

## 2015-08-30 DIAGNOSIS — E039 Hypothyroidism, unspecified: Secondary | ICD-10-CM | POA: Diagnosis not present

## 2015-10-13 DIAGNOSIS — M5137 Other intervertebral disc degeneration, lumbosacral region: Secondary | ICD-10-CM | POA: Diagnosis not present

## 2015-11-02 DIAGNOSIS — H43819 Vitreous degeneration, unspecified eye: Secondary | ICD-10-CM | POA: Diagnosis not present

## 2015-11-02 DIAGNOSIS — E119 Type 2 diabetes mellitus without complications: Secondary | ICD-10-CM | POA: Diagnosis not present

## 2015-11-02 DIAGNOSIS — H2513 Age-related nuclear cataract, bilateral: Secondary | ICD-10-CM | POA: Diagnosis not present

## 2015-11-23 ENCOUNTER — Other Ambulatory Visit: Payer: Self-pay | Admitting: Pain Medicine

## 2015-11-23 DIAGNOSIS — M5137 Other intervertebral disc degeneration, lumbosacral region: Secondary | ICD-10-CM | POA: Diagnosis not present

## 2015-11-23 DIAGNOSIS — L259 Unspecified contact dermatitis, unspecified cause: Secondary | ICD-10-CM | POA: Diagnosis not present

## 2015-11-23 DIAGNOSIS — M545 Low back pain: Secondary | ICD-10-CM

## 2015-12-02 ENCOUNTER — Ambulatory Visit
Admission: RE | Admit: 2015-12-02 | Discharge: 2015-12-02 | Disposition: A | Discharge status: Home / Self Care | Payer: Self-pay | Source: Ambulatory Visit | Attending: Pain Medicine | Admitting: Pain Medicine

## 2015-12-02 ENCOUNTER — Other Ambulatory Visit (INDEPENDENT_AMBULATORY_CARE_PROVIDER_SITE_OTHER): Payer: Self-pay | Admitting: Internal Medicine

## 2015-12-02 DIAGNOSIS — M4806 Spinal stenosis, lumbar region: Secondary | ICD-10-CM | POA: Diagnosis not present

## 2015-12-02 DIAGNOSIS — M545 Low back pain: Secondary | ICD-10-CM

## 2015-12-21 DIAGNOSIS — M5137 Other intervertebral disc degeneration, lumbosacral region: Secondary | ICD-10-CM | POA: Diagnosis not present

## 2016-01-11 DIAGNOSIS — R6889 Other general symptoms and signs: Secondary | ICD-10-CM | POA: Diagnosis not present

## 2016-02-02 DIAGNOSIS — M25551 Pain in right hip: Secondary | ICD-10-CM | POA: Diagnosis not present

## 2016-02-02 DIAGNOSIS — M1611 Unilateral primary osteoarthritis, right hip: Secondary | ICD-10-CM | POA: Diagnosis not present

## 2016-02-02 DIAGNOSIS — G894 Chronic pain syndrome: Secondary | ICD-10-CM | POA: Diagnosis not present

## 2016-02-10 DIAGNOSIS — M1611 Unilateral primary osteoarthritis, right hip: Secondary | ICD-10-CM | POA: Diagnosis not present

## 2016-02-23 DIAGNOSIS — E559 Vitamin D deficiency, unspecified: Secondary | ICD-10-CM | POA: Diagnosis not present

## 2016-02-23 DIAGNOSIS — E119 Type 2 diabetes mellitus without complications: Secondary | ICD-10-CM | POA: Diagnosis not present

## 2016-02-23 DIAGNOSIS — E039 Hypothyroidism, unspecified: Secondary | ICD-10-CM | POA: Diagnosis not present

## 2016-02-23 DIAGNOSIS — I10 Essential (primary) hypertension: Secondary | ICD-10-CM | POA: Diagnosis not present

## 2016-02-23 DIAGNOSIS — M109 Gout, unspecified: Secondary | ICD-10-CM | POA: Diagnosis not present

## 2016-03-01 DIAGNOSIS — I10 Essential (primary) hypertension: Secondary | ICD-10-CM | POA: Diagnosis not present

## 2016-03-01 DIAGNOSIS — H6121 Impacted cerumen, right ear: Secondary | ICD-10-CM | POA: Diagnosis not present

## 2016-03-01 DIAGNOSIS — E039 Hypothyroidism, unspecified: Secondary | ICD-10-CM | POA: Diagnosis not present

## 2016-03-01 DIAGNOSIS — E119 Type 2 diabetes mellitus without complications: Secondary | ICD-10-CM | POA: Diagnosis not present

## 2016-03-06 DIAGNOSIS — M1611 Unilateral primary osteoarthritis, right hip: Secondary | ICD-10-CM | POA: Diagnosis not present

## 2016-04-13 DIAGNOSIS — M1611 Unilateral primary osteoarthritis, right hip: Secondary | ICD-10-CM | POA: Diagnosis not present

## 2016-04-17 DIAGNOSIS — M25561 Pain in right knee: Secondary | ICD-10-CM | POA: Diagnosis not present

## 2016-04-17 DIAGNOSIS — M25551 Pain in right hip: Secondary | ICD-10-CM | POA: Diagnosis not present

## 2016-04-24 DIAGNOSIS — M79651 Pain in right thigh: Secondary | ICD-10-CM | POA: Diagnosis not present

## 2016-05-03 DIAGNOSIS — M25551 Pain in right hip: Secondary | ICD-10-CM | POA: Diagnosis not present

## 2016-05-30 DIAGNOSIS — M79604 Pain in right leg: Secondary | ICD-10-CM | POA: Diagnosis not present

## 2016-06-05 DIAGNOSIS — M25551 Pain in right hip: Secondary | ICD-10-CM | POA: Diagnosis not present

## 2016-06-13 DIAGNOSIS — M25551 Pain in right hip: Secondary | ICD-10-CM | POA: Diagnosis not present

## 2016-07-05 DIAGNOSIS — M25551 Pain in right hip: Secondary | ICD-10-CM | POA: Diagnosis not present

## 2016-08-13 ENCOUNTER — Emergency Department (HOSPITAL_COMMUNITY)
Admission: EM | Admit: 2016-08-13 | Discharge: 2016-08-13 | Disposition: A | Discharge status: Home / Self Care | Payer: Medicare Other | Attending: Emergency Medicine | Admitting: Emergency Medicine

## 2016-08-13 ENCOUNTER — Encounter (HOSPITAL_COMMUNITY): Payer: Self-pay

## 2016-08-13 DIAGNOSIS — J45909 Unspecified asthma, uncomplicated: Secondary | ICD-10-CM | POA: Insufficient documentation

## 2016-08-13 DIAGNOSIS — Z87891 Personal history of nicotine dependence: Secondary | ICD-10-CM | POA: Diagnosis not present

## 2016-08-13 DIAGNOSIS — R42 Dizziness and giddiness: Secondary | ICD-10-CM

## 2016-08-13 DIAGNOSIS — Z7982 Long term (current) use of aspirin: Secondary | ICD-10-CM | POA: Diagnosis not present

## 2016-08-13 DIAGNOSIS — I10 Essential (primary) hypertension: Secondary | ICD-10-CM | POA: Diagnosis not present

## 2016-08-13 DIAGNOSIS — Z79899 Other long term (current) drug therapy: Secondary | ICD-10-CM | POA: Insufficient documentation

## 2016-08-13 DIAGNOSIS — R531 Weakness: Secondary | ICD-10-CM | POA: Diagnosis not present

## 2016-08-13 DIAGNOSIS — R51 Headache: Secondary | ICD-10-CM | POA: Diagnosis not present

## 2016-08-13 LAB — CBC WITH DIFFERENTIAL/PLATELET
BASOS ABS: 0 10*3/uL (ref 0.0–0.1)
BASOS PCT: 0 %
EOS ABS: 0.1 10*3/uL (ref 0.0–0.7)
EOS PCT: 1 %
HCT: 36.2 % (ref 36.0–46.0)
Hemoglobin: 12.1 g/dL (ref 12.0–15.0)
Lymphocytes Relative: 12 %
Lymphs Abs: 0.9 10*3/uL (ref 0.7–4.0)
MCH: 28.5 pg (ref 26.0–34.0)
MCHC: 33.4 g/dL (ref 30.0–36.0)
MCV: 85.4 fL (ref 78.0–100.0)
MONO ABS: 0.3 10*3/uL (ref 0.1–1.0)
Monocytes Relative: 5 %
Neutro Abs: 5.9 10*3/uL (ref 1.7–7.7)
Neutrophils Relative %: 82 %
PLATELETS: 244 10*3/uL (ref 150–400)
RBC: 4.24 MIL/uL (ref 3.87–5.11)
RDW: 15.9 % — AB (ref 11.5–15.5)
WBC: 7.3 10*3/uL (ref 4.0–10.5)

## 2016-08-13 LAB — URINALYSIS, ROUTINE W REFLEX MICROSCOPIC
Bilirubin Urine: NEGATIVE
Glucose, UA: NEGATIVE mg/dL
Hgb urine dipstick: NEGATIVE
KETONES UR: NEGATIVE mg/dL
LEUKOCYTES UA: NEGATIVE
NITRITE: NEGATIVE
PH: 7.5 (ref 5.0–8.0)
Protein, ur: NEGATIVE mg/dL
Specific Gravity, Urine: 1.011 (ref 1.005–1.030)

## 2016-08-13 LAB — COMPREHENSIVE METABOLIC PANEL
ALBUMIN: 4.1 g/dL (ref 3.5–5.0)
ALT: 13 U/L — ABNORMAL LOW (ref 14–54)
AST: 28 U/L (ref 15–41)
Alkaline Phosphatase: 93 U/L (ref 38–126)
Anion gap: 7 (ref 5–15)
BUN: 19 mg/dL (ref 6–20)
CHLORIDE: 110 mmol/L (ref 101–111)
CO2: 23 mmol/L (ref 22–32)
Calcium: 9.2 mg/dL (ref 8.9–10.3)
Creatinine, Ser: 1.17 mg/dL — ABNORMAL HIGH (ref 0.44–1.00)
GFR calc Af Amer: 50 mL/min — ABNORMAL LOW (ref >60)
GFR, EST NON AFRICAN AMERICAN: 43 mL/min — AB (ref >60)
GLUCOSE: 118 mg/dL — AB (ref 65–99)
POTASSIUM: 4 mmol/L (ref 3.5–5.1)
SODIUM: 140 mmol/L (ref 135–145)
Total Bilirubin: 0.7 mg/dL (ref 0.3–1.2)
Total Protein: 7.6 g/dL (ref 6.5–8.1)

## 2016-08-13 LAB — I-STAT TROPONIN, ED: TROPONIN I, POC: 0 ng/mL (ref 0.00–0.08)

## 2016-08-13 MED ORDER — SODIUM CHLORIDE 0.9 % IV BOLUS (SEPSIS)
1000.0000 mL | Freq: Once | INTRAVENOUS | Status: AC
  Administered 2016-08-13: 1000 mL via INTRAVENOUS

## 2016-08-13 MED ORDER — MECLIZINE HCL 25 MG PO TABS
50.0000 mg | ORAL_TABLET | Freq: Once | ORAL | Status: AC
  Administered 2016-08-13: 50 mg via ORAL
  Filled 2016-08-13: qty 2

## 2016-08-13 MED ORDER — MECLIZINE HCL 25 MG PO TABS: 25.0000 mg | ORAL_TABLET | Freq: Three times a day (TID) | ORAL | 0 refills | Status: DC | PRN

## 2016-08-13 MED ORDER — DIAZEPAM 5 MG/ML IJ SOLN
2.5000 mg | Freq: Once | INTRAMUSCULAR | Status: AC
  Administered 2016-08-13: 2.5 mg via INTRAVENOUS
  Filled 2016-08-13: qty 2

## 2016-08-13 NOTE — ED Triage Notes (Signed)
Per EMS, pt from home.  Pt woke up at 2:30 this morning. Pt has vertigo chronic.  Pt takes ativan for symptoms. Took 1mg  this morning. Pt took another dose about an hour ago, 2mg .  N/V.  Vitals: 140/70, hr 86, resp 18, 98% ra, cbg 110

## 2016-08-13 NOTE — ED Notes (Signed)
Nurse is going to start an IV and collect the labs

## 2016-08-13 NOTE — ED Notes (Signed)
PA at bedside.

## 2016-08-13 NOTE — Progress Notes (Addendum)
Pt confirms pcp is [facility] updated  Pt assisted to get siderail down to go to restroom Ambulated with walker with assist of female family member  Pt seen by ED PA/NP after returning to stretcher

## 2016-08-13 NOTE — ED Provider Notes (Signed)
[facility]-EMERGENCY DEPT Provider Note   CSN: [id number] Arrival date & time: 08/13/16  1111     History   Chief Complaint Chief Complaint  Patient presents with  . Dizziness    HPI Julie [name] is a 81 y.o. female.  HPI Julie [name] is a 81 y.o. female with history of hypertension, asthma, vertigo, presents to emergency department with complaint of worsening vertigo symptoms. Patient states that she takes Ativan 1-3 tablets daily for vertigo prevention. She states she does not have vertigo every day. She states that she last had really bad vertigo approximately 3 years ago. She states this time symptoms started in the mild to night when she woke up to go to the bathroom. She reports sensation of everything spinning around. She denies any earache or ringing in her ears. She denies any headache. She denies any numbness or weakness in extremities otherwise. She states symptoms are very similar to prior vertigo. She took one Ativan at 2 AM and again 2 this morning. She states no relief of vertigo with Ativan. Denies any nausea or vomiting. She states she is very hungry. She states dizziness is worse when she moves. Unable to walk.  Past Medical History:  Diagnosis Date  . Asthma   . Hypertension     There are no active problems to display for this patient.   Past Surgical History:  Procedure Laterality Date  . foot surgery    . ovarina cyst removal     ovarian cyst removal    OB History    No data available       Home Medications    Prior to Admission medications   Medication Sig Start Date End Date Taking? Authorizing Provider  amLODipine (NORVASC) 10 MG tablet Take 10 mg by mouth daily.    Historical Provider, MD  aspirin 81 MG tablet Take 81 mg by mouth daily.    Historical Provider, MD  indomethacin (INDOCIN) 50 MG capsule Take 50 mg by mouth daily. gout     Historical Provider, MD  levothyroxine (SYNTHROID, LEVOTHROID) 50 MCG tablet Take 50 mcg by mouth every evening.      Historical Provider, MD  meclizine (ANTIVERT) 25 MG tablet Take 25 mg by mouth every 6 (six) hours as needed. vertigo    Historical Provider, MD  traMADol (ULTRAM) 50 MG tablet Take 50 mg by mouth 2 (two) times daily as needed. Maximum dose= 8 tablets per day pain     Historical Provider, MD    Family History History reviewed. No pertinent family history.  Social History Social History  Substance Use Topics  . Smoking status: Former Games developer  . Smokeless tobacco: Never Used  . Alcohol use No     Allergies   Aspirin   Review of Systems Review of Systems  Constitutional: Negative for chills and fever.  Respiratory: Negative for cough, chest tightness and shortness of breath.   Cardiovascular: Negative for chest pain, palpitations and leg swelling.  Gastrointestinal: Negative for abdominal pain, diarrhea, nausea and vomiting.  Genitourinary: Negative for dysuria, flank pain and pelvic pain.  Musculoskeletal: Negative for arthralgias, myalgias, neck pain and neck stiffness.  Skin: Negative for rash.  Neurological: Positive for dizziness and light-headedness. Negative for weakness and headaches.  All other systems reviewed and are negative.    Physical Exam Updated Vital Signs BP 130/65   Pulse 81   Temp 98.1 F (36.7 C)   Resp 16   SpO2 96%   Physical Exam  Constitutional: She is oriented to person, place, and time. She appears well-developed and well-nourished. No distress.  HENT:  Head: Normocephalic.  Eyes: Conjunctivae and EOM are normal. Pupils are equal, round, and reactive to light.  Neck: Neck supple.  Cardiovascular: Normal rate, regular rhythm and normal heart sounds.   Pulmonary/Chest: Effort normal and breath sounds normal. No respiratory distress. She has no wheezes. She has no rales.  Abdominal: Soft. Bowel sounds are normal. She exhibits no distension. There is no tenderness. There is no rebound.  Musculoskeletal: She exhibits no edema.    Neurological: She is alert and oriented to person, place, and time. No cranial nerve deficit. Coordination normal.  5/5 and equal upper and lower extremity strength bilaterally. Equal grip strength bilaterally. Normal finger to nose and heel to shin. No pronator drift.   Skin: Skin is warm and dry.  Psychiatric: She has a normal mood and affect. Her behavior is normal.  Nursing note and vitals reviewed.    ED Treatments / Results  Labs (all labs ordered are listed, but only abnormal results are displayed) Labs Reviewed  CBC WITH DIFFERENTIAL/PLATELET - Abnormal; Notable for the following:       Result Value   RDW 15.9 (*)    All other components within normal limits  COMPREHENSIVE METABOLIC PANEL - Abnormal; Notable for the following:    Glucose, Bld 118 (*)    Creatinine, Ser 1.17 (*)    ALT 13 (*)    GFR calc non Af Amer 43 (*)    GFR calc Af Amer 50 (*)    All other components within normal limits  URINALYSIS, ROUTINE W REFLEX MICROSCOPIC (NOT [facility])  I-STAT TROPOININ, ED    EKG  EKG Interpretation  Date/Time:  Monday August 13 2016 13:15:13 EST Ventricular Rate:  87 PR Interval:    QRS Duration: 83 QT Interval:  362 QTC Calculation: 436 R Axis:   -19 Text Interpretation:  Sinus rhythm Ventricular premature complex Borderline left axis deviation No significant change since last tracing Confirmed by LITTLE MD, RACHEL ([id number]) on 08/13/2016 3:41:11 PM       Radiology No results found.  Procedures Procedures (including critical care time)  Medications Ordered in ED Medications  diazepam (VALIUM) injection 2.5 mg (2.5 mg Intravenous Given 08/13/16 1343)  sodium chloride 0.9 % bolus 1,000 mL (1,000 mLs Intravenous New Bag/Given 08/13/16 1336)     Initial Impression / Assessment and Plan / ED Course  I have reviewed the triage vital signs and the nursing notes.  Pertinent labs & imaging results that were available during my care of the patient were reviewed by  me and considered in my medical decision making (see chart for details).  Clinical Course     Patient seen and examined. Patient with recurrent vertigo. He reports sensation of room spinning. No headache, doubt intracranial bleed. Will try IV Valium for her symptoms. We'll check labs and urinalysis. Patient is very dizzy during my exam, unable to move her head or stand up without vertigo symptoms.  3:48 PM Pt received fluids, meclizine 50mg , valium 2.5mg  IV. She feels much better and was able to ambulate to the bathroom with her walker with no assistance. Discussed with Dr. Clarene DukeLittle who has seen her as well. I do not think she needs any further imagining given improvement of symptoms, and typical to prior symptoms. Will dc home with meclizine and pcp follow up   Vitals:   08/13/16 1126 08/13/16 1359  BP: 130/65  152/80  Pulse: 81 84  Resp: 16 16  Temp: 98.1 F (36.7 C)   SpO2: 96% 96%     Final Clinical Impressions(s) / ED Diagnoses   Final diagnoses:  Vertigo    New Prescriptions Discharge Medication List as of 08/13/2016  3:58 PM    START taking these medications   Details  !! meclizine (ANTIVERT) 25 MG tablet Take 1 tablet (25 mg total) by mouth 3 (three) times daily as needed for dizziness., Starting Mon 08/13/2016, Print     !! - Potential duplicate medications found. Please discuss with provider.       Jaynie Crumbleatyana Quisha Mabie, PA-C 08/14/16 1541    Laurence Spatesachel Morgan Little, MD 08/18/16 [phone]

## 2016-08-13 NOTE — ED Notes (Signed)
Bed: [facility] Expected date:  Expected time:  Means of arrival:  Comments: EMS-vertigo 

## 2016-08-13 NOTE — ED Notes (Signed)
Attempted to draw labs patient gone to TCU for ekg

## 2016-08-13 NOTE — ED Notes (Signed)
Pt was able to ambulate to the restroom without assistance.  

## 2016-08-13 NOTE — Discharge Instructions (Signed)
Take meclizine as prescribed. Follow up with family doctor if not improving. Return if worsening.

## 2016-08-16 DIAGNOSIS — E039 Hypothyroidism, unspecified: Secondary | ICD-10-CM | POA: Diagnosis not present

## 2016-08-16 DIAGNOSIS — E119 Type 2 diabetes mellitus without complications: Secondary | ICD-10-CM | POA: Diagnosis not present

## 2016-08-16 DIAGNOSIS — E559 Vitamin D deficiency, unspecified: Secondary | ICD-10-CM | POA: Diagnosis not present

## 2016-08-16 DIAGNOSIS — I10 Essential (primary) hypertension: Secondary | ICD-10-CM | POA: Diagnosis not present

## 2016-09-05 DIAGNOSIS — E039 Hypothyroidism, unspecified: Secondary | ICD-10-CM | POA: Diagnosis not present

## 2016-09-05 DIAGNOSIS — E119 Type 2 diabetes mellitus without complications: Secondary | ICD-10-CM | POA: Diagnosis not present

## 2016-09-05 DIAGNOSIS — I10 Essential (primary) hypertension: Secondary | ICD-10-CM | POA: Diagnosis not present

## 2016-09-05 DIAGNOSIS — Z23 Encounter for immunization: Secondary | ICD-10-CM | POA: Diagnosis not present

## 2016-09-13 ENCOUNTER — Other Ambulatory Visit: Payer: Self-pay | Admitting: Internal Medicine

## 2016-09-13 DIAGNOSIS — Z0000 Encounter for general adult medical examination without abnormal findings: Secondary | ICD-10-CM | POA: Diagnosis not present

## 2016-09-13 DIAGNOSIS — I10 Essential (primary) hypertension: Secondary | ICD-10-CM | POA: Diagnosis not present

## 2016-09-13 DIAGNOSIS — E559 Vitamin D deficiency, unspecified: Secondary | ICD-10-CM | POA: Diagnosis not present

## 2016-09-13 DIAGNOSIS — E039 Hypothyroidism, unspecified: Secondary | ICD-10-CM | POA: Diagnosis not present

## 2016-09-13 DIAGNOSIS — E119 Type 2 diabetes mellitus without complications: Secondary | ICD-10-CM | POA: Diagnosis not present

## 2016-09-13 DIAGNOSIS — R634 Abnormal weight loss: Secondary | ICD-10-CM

## 2016-09-14 DIAGNOSIS — H2513 Age-related nuclear cataract, bilateral: Secondary | ICD-10-CM | POA: Diagnosis not present

## 2016-09-14 DIAGNOSIS — H43819 Vitreous degeneration, unspecified eye: Secondary | ICD-10-CM | POA: Diagnosis not present

## 2016-09-14 DIAGNOSIS — E119 Type 2 diabetes mellitus without complications: Secondary | ICD-10-CM | POA: Diagnosis not present

## 2016-09-21 ENCOUNTER — Ambulatory Visit
Admission: RE | Admit: 2016-09-21 | Discharge: 2016-09-21 | Disposition: A | Discharge status: Home / Self Care | Payer: Medicare Other | Source: Ambulatory Visit | Attending: Internal Medicine | Admitting: Internal Medicine

## 2016-09-21 ENCOUNTER — Encounter (INDEPENDENT_AMBULATORY_CARE_PROVIDER_SITE_OTHER): Payer: Self-pay

## 2016-09-21 DIAGNOSIS — R634 Abnormal weight loss: Secondary | ICD-10-CM

## 2016-09-21 DIAGNOSIS — K5732 Diverticulitis of large intestine without perforation or abscess without bleeding: Secondary | ICD-10-CM | POA: Diagnosis not present

## 2016-09-21 DIAGNOSIS — R918 Other nonspecific abnormal finding of lung field: Secondary | ICD-10-CM | POA: Diagnosis not present

## 2016-09-21 MED ORDER — IOPAMIDOL (ISOVUE-300) INJECTION 61%
70.0000 mL | Freq: Once | INTRAVENOUS | Status: AC | PRN
  Administered 2016-09-21: 70 mL via INTRAVENOUS

## 2016-10-04 DIAGNOSIS — R197 Diarrhea, unspecified: Secondary | ICD-10-CM | POA: Diagnosis not present

## 2016-10-15 DIAGNOSIS — H25812 Combined forms of age-related cataract, left eye: Secondary | ICD-10-CM | POA: Diagnosis not present

## 2016-10-15 DIAGNOSIS — H25811 Combined forms of age-related cataract, right eye: Secondary | ICD-10-CM | POA: Diagnosis not present

## 2016-11-30 DIAGNOSIS — E119 Type 2 diabetes mellitus without complications: Secondary | ICD-10-CM | POA: Diagnosis not present

## 2016-11-30 DIAGNOSIS — E559 Vitamin D deficiency, unspecified: Secondary | ICD-10-CM | POA: Diagnosis not present

## 2016-11-30 DIAGNOSIS — M109 Gout, unspecified: Secondary | ICD-10-CM | POA: Diagnosis not present

## 2016-11-30 DIAGNOSIS — I10 Essential (primary) hypertension: Secondary | ICD-10-CM | POA: Diagnosis not present

## 2016-11-30 DIAGNOSIS — E039 Hypothyroidism, unspecified: Secondary | ICD-10-CM | POA: Diagnosis not present

## 2016-12-05 DIAGNOSIS — H2512 Age-related nuclear cataract, left eye: Secondary | ICD-10-CM | POA: Diagnosis not present

## 2016-12-05 DIAGNOSIS — H25812 Combined forms of age-related cataract, left eye: Secondary | ICD-10-CM | POA: Diagnosis not present

## 2016-12-12 DIAGNOSIS — M859 Disorder of bone density and structure, unspecified: Secondary | ICD-10-CM | POA: Diagnosis not present

## 2016-12-13 DIAGNOSIS — M25551 Pain in right hip: Secondary | ICD-10-CM | POA: Diagnosis not present

## 2016-12-13 DIAGNOSIS — E118 Type 2 diabetes mellitus with unspecified complications: Secondary | ICD-10-CM | POA: Diagnosis not present

## 2016-12-13 DIAGNOSIS — Z0000 Encounter for general adult medical examination without abnormal findings: Secondary | ICD-10-CM | POA: Diagnosis not present

## 2016-12-13 DIAGNOSIS — I10 Essential (primary) hypertension: Secondary | ICD-10-CM | POA: Diagnosis not present

## 2017-01-01 DIAGNOSIS — M545 Low back pain: Secondary | ICD-10-CM | POA: Diagnosis not present

## 2017-01-01 DIAGNOSIS — M7070 Other bursitis of hip, unspecified hip: Secondary | ICD-10-CM | POA: Diagnosis not present

## 2017-01-01 DIAGNOSIS — M5416 Radiculopathy, lumbar region: Secondary | ICD-10-CM | POA: Diagnosis not present

## 2017-01-01 DIAGNOSIS — M25551 Pain in right hip: Secondary | ICD-10-CM | POA: Diagnosis not present

## 2017-01-09 DIAGNOSIS — Z0000 Encounter for general adult medical examination without abnormal findings: Secondary | ICD-10-CM | POA: Diagnosis not present

## 2017-01-09 DIAGNOSIS — E119 Type 2 diabetes mellitus without complications: Secondary | ICD-10-CM | POA: Diagnosis not present

## 2017-01-09 DIAGNOSIS — M25561 Pain in right knee: Secondary | ICD-10-CM | POA: Diagnosis not present

## 2017-01-09 DIAGNOSIS — I10 Essential (primary) hypertension: Secondary | ICD-10-CM | POA: Diagnosis not present

## 2017-01-09 DIAGNOSIS — G8929 Other chronic pain: Secondary | ICD-10-CM | POA: Diagnosis not present

## 2017-01-24 DIAGNOSIS — E119 Type 2 diabetes mellitus without complications: Secondary | ICD-10-CM | POA: Diagnosis not present

## 2017-01-24 DIAGNOSIS — H538 Other visual disturbances: Secondary | ICD-10-CM | POA: Diagnosis not present

## 2017-01-24 DIAGNOSIS — H269 Unspecified cataract: Secondary | ICD-10-CM | POA: Diagnosis not present

## 2017-02-05 DIAGNOSIS — M25561 Pain in right knee: Secondary | ICD-10-CM | POA: Diagnosis not present

## 2017-02-09 DIAGNOSIS — M545 Low back pain: Secondary | ICD-10-CM | POA: Diagnosis not present

## 2017-02-11 DIAGNOSIS — M25561 Pain in right knee: Secondary | ICD-10-CM | POA: Diagnosis not present

## 2017-03-05 DIAGNOSIS — M4316 Spondylolisthesis, lumbar region: Secondary | ICD-10-CM | POA: Diagnosis not present

## 2017-03-05 DIAGNOSIS — M48062 Spinal stenosis, lumbar region with neurogenic claudication: Secondary | ICD-10-CM | POA: Diagnosis not present

## 2017-03-05 DIAGNOSIS — M4726 Other spondylosis with radiculopathy, lumbar region: Secondary | ICD-10-CM | POA: Diagnosis not present

## 2017-05-09 DIAGNOSIS — E559 Vitamin D deficiency, unspecified: Secondary | ICD-10-CM | POA: Diagnosis not present

## 2017-05-09 DIAGNOSIS — Z5181 Encounter for therapeutic drug level monitoring: Secondary | ICD-10-CM | POA: Diagnosis not present

## 2017-05-09 DIAGNOSIS — I10 Essential (primary) hypertension: Secondary | ICD-10-CM | POA: Diagnosis not present

## 2017-05-09 DIAGNOSIS — E039 Hypothyroidism, unspecified: Secondary | ICD-10-CM | POA: Diagnosis not present

## 2017-05-09 DIAGNOSIS — E119 Type 2 diabetes mellitus without complications: Secondary | ICD-10-CM | POA: Diagnosis not present

## 2017-05-09 DIAGNOSIS — Z79899 Other long term (current) drug therapy: Secondary | ICD-10-CM | POA: Diagnosis not present

## 2017-05-15 DIAGNOSIS — I10 Essential (primary) hypertension: Secondary | ICD-10-CM | POA: Diagnosis not present

## 2017-05-15 DIAGNOSIS — E039 Hypothyroidism, unspecified: Secondary | ICD-10-CM | POA: Diagnosis not present

## 2017-05-15 DIAGNOSIS — E119 Type 2 diabetes mellitus without complications: Secondary | ICD-10-CM | POA: Diagnosis not present

## 2017-05-15 DIAGNOSIS — E559 Vitamin D deficiency, unspecified: Secondary | ICD-10-CM | POA: Diagnosis not present

## 2017-06-03 DIAGNOSIS — M25561 Pain in right knee: Secondary | ICD-10-CM | POA: Diagnosis not present

## 2017-06-27 DIAGNOSIS — M25561 Pain in right knee: Secondary | ICD-10-CM | POA: Diagnosis not present

## 2017-06-27 DIAGNOSIS — M25551 Pain in right hip: Secondary | ICD-10-CM | POA: Diagnosis not present

## 2017-07-22 DIAGNOSIS — M25551 Pain in right hip: Secondary | ICD-10-CM | POA: Diagnosis not present

## 2017-07-29 DIAGNOSIS — M1711 Unilateral primary osteoarthritis, right knee: Secondary | ICD-10-CM | POA: Diagnosis not present

## 2017-08-05 DIAGNOSIS — M1711 Unilateral primary osteoarthritis, right knee: Secondary | ICD-10-CM | POA: Diagnosis not present

## 2017-08-12 DIAGNOSIS — M1711 Unilateral primary osteoarthritis, right knee: Secondary | ICD-10-CM | POA: Diagnosis not present

## 2017-08-28 DIAGNOSIS — M545 Low back pain: Secondary | ICD-10-CM | POA: Diagnosis not present

## 2017-08-28 DIAGNOSIS — M48061 Spinal stenosis, lumbar region without neurogenic claudication: Secondary | ICD-10-CM | POA: Diagnosis not present

## 2017-09-05 DIAGNOSIS — M48061 Spinal stenosis, lumbar region without neurogenic claudication: Secondary | ICD-10-CM | POA: Diagnosis not present

## 2017-09-05 DIAGNOSIS — M545 Low back pain: Secondary | ICD-10-CM | POA: Diagnosis not present

## 2017-10-09 DIAGNOSIS — M5126 Other intervertebral disc displacement, lumbar region: Secondary | ICD-10-CM | POA: Diagnosis not present

## 2017-10-09 DIAGNOSIS — M48061 Spinal stenosis, lumbar region without neurogenic claudication: Secondary | ICD-10-CM | POA: Diagnosis not present

## 2017-10-09 DIAGNOSIS — M545 Low back pain: Secondary | ICD-10-CM | POA: Diagnosis not present

## 2017-10-21 DIAGNOSIS — M545 Low back pain: Secondary | ICD-10-CM | POA: Diagnosis not present

## 2017-10-21 DIAGNOSIS — M48061 Spinal stenosis, lumbar region without neurogenic claudication: Secondary | ICD-10-CM | POA: Diagnosis not present

## 2017-10-21 DIAGNOSIS — M5126 Other intervertebral disc displacement, lumbar region: Secondary | ICD-10-CM | POA: Diagnosis not present

## 2017-11-20 DIAGNOSIS — M545 Low back pain: Secondary | ICD-10-CM | POA: Diagnosis not present

## 2017-11-20 DIAGNOSIS — M5126 Other intervertebral disc displacement, lumbar region: Secondary | ICD-10-CM | POA: Diagnosis not present

## 2017-11-20 DIAGNOSIS — M48061 Spinal stenosis, lumbar region without neurogenic claudication: Secondary | ICD-10-CM | POA: Diagnosis not present

## 2017-11-20 DIAGNOSIS — M1711 Unilateral primary osteoarthritis, right knee: Secondary | ICD-10-CM | POA: Diagnosis not present

## 2017-12-03 DIAGNOSIS — M1711 Unilateral primary osteoarthritis, right knee: Secondary | ICD-10-CM | POA: Diagnosis not present

## 2017-12-07 DIAGNOSIS — M25561 Pain in right knee: Secondary | ICD-10-CM | POA: Diagnosis not present

## 2017-12-10 DIAGNOSIS — I10 Essential (primary) hypertension: Secondary | ICD-10-CM | POA: Diagnosis not present

## 2017-12-12 DIAGNOSIS — M25561 Pain in right knee: Secondary | ICD-10-CM | POA: Diagnosis not present

## 2017-12-16 DIAGNOSIS — Z79899 Other long term (current) drug therapy: Secondary | ICD-10-CM | POA: Diagnosis not present

## 2017-12-16 DIAGNOSIS — Z5181 Encounter for therapeutic drug level monitoring: Secondary | ICD-10-CM | POA: Diagnosis not present

## 2017-12-16 DIAGNOSIS — G8929 Other chronic pain: Secondary | ICD-10-CM | POA: Diagnosis not present

## 2017-12-16 DIAGNOSIS — E119 Type 2 diabetes mellitus without complications: Secondary | ICD-10-CM | POA: Diagnosis not present

## 2017-12-16 DIAGNOSIS — R2681 Unsteadiness on feet: Secondary | ICD-10-CM | POA: Diagnosis not present

## 2017-12-16 DIAGNOSIS — E559 Vitamin D deficiency, unspecified: Secondary | ICD-10-CM | POA: Diagnosis not present

## 2017-12-16 DIAGNOSIS — M25561 Pain in right knee: Secondary | ICD-10-CM | POA: Diagnosis not present

## 2017-12-16 DIAGNOSIS — Z0000 Encounter for general adult medical examination without abnormal findings: Secondary | ICD-10-CM | POA: Diagnosis not present

## 2017-12-20 DIAGNOSIS — E559 Vitamin D deficiency, unspecified: Secondary | ICD-10-CM | POA: Diagnosis not present

## 2017-12-20 DIAGNOSIS — Z7952 Long term (current) use of systemic steroids: Secondary | ICD-10-CM | POA: Diagnosis not present

## 2017-12-20 DIAGNOSIS — Z96652 Presence of left artificial knee joint: Secondary | ICD-10-CM | POA: Diagnosis not present

## 2017-12-20 DIAGNOSIS — Z9181 History of falling: Secondary | ICD-10-CM | POA: Diagnosis not present

## 2017-12-20 DIAGNOSIS — G8929 Other chronic pain: Secondary | ICD-10-CM | POA: Diagnosis not present

## 2017-12-20 DIAGNOSIS — M545 Low back pain: Secondary | ICD-10-CM | POA: Diagnosis not present

## 2017-12-20 DIAGNOSIS — Z79891 Long term (current) use of opiate analgesic: Secondary | ICD-10-CM | POA: Diagnosis not present

## 2017-12-20 DIAGNOSIS — E039 Hypothyroidism, unspecified: Secondary | ICD-10-CM | POA: Diagnosis not present

## 2017-12-20 DIAGNOSIS — J45909 Unspecified asthma, uncomplicated: Secondary | ICD-10-CM | POA: Diagnosis not present

## 2017-12-20 DIAGNOSIS — Z7984 Long term (current) use of oral hypoglycemic drugs: Secondary | ICD-10-CM | POA: Diagnosis not present

## 2017-12-20 DIAGNOSIS — M150 Primary generalized (osteo)arthritis: Secondary | ICD-10-CM | POA: Diagnosis not present

## 2017-12-20 DIAGNOSIS — E119 Type 2 diabetes mellitus without complications: Secondary | ICD-10-CM | POA: Diagnosis not present

## 2017-12-20 DIAGNOSIS — I10 Essential (primary) hypertension: Secondary | ICD-10-CM | POA: Diagnosis not present

## 2017-12-20 DIAGNOSIS — M109 Gout, unspecified: Secondary | ICD-10-CM | POA: Diagnosis not present

## 2017-12-26 DIAGNOSIS — Z96652 Presence of left artificial knee joint: Secondary | ICD-10-CM | POA: Diagnosis not present

## 2017-12-26 DIAGNOSIS — E039 Hypothyroidism, unspecified: Secondary | ICD-10-CM | POA: Diagnosis not present

## 2017-12-26 DIAGNOSIS — E119 Type 2 diabetes mellitus without complications: Secondary | ICD-10-CM | POA: Diagnosis not present

## 2017-12-26 DIAGNOSIS — E559 Vitamin D deficiency, unspecified: Secondary | ICD-10-CM | POA: Diagnosis not present

## 2017-12-26 DIAGNOSIS — I10 Essential (primary) hypertension: Secondary | ICD-10-CM | POA: Diagnosis not present

## 2017-12-26 DIAGNOSIS — G8929 Other chronic pain: Secondary | ICD-10-CM | POA: Diagnosis not present

## 2017-12-26 DIAGNOSIS — Z7952 Long term (current) use of systemic steroids: Secondary | ICD-10-CM | POA: Diagnosis not present

## 2017-12-26 DIAGNOSIS — Z79891 Long term (current) use of opiate analgesic: Secondary | ICD-10-CM | POA: Diagnosis not present

## 2017-12-26 DIAGNOSIS — J45909 Unspecified asthma, uncomplicated: Secondary | ICD-10-CM | POA: Diagnosis not present

## 2017-12-26 DIAGNOSIS — M109 Gout, unspecified: Secondary | ICD-10-CM | POA: Diagnosis not present

## 2017-12-26 DIAGNOSIS — M150 Primary generalized (osteo)arthritis: Secondary | ICD-10-CM | POA: Diagnosis not present

## 2017-12-26 DIAGNOSIS — M545 Low back pain: Secondary | ICD-10-CM | POA: Diagnosis not present

## 2017-12-26 DIAGNOSIS — Z7984 Long term (current) use of oral hypoglycemic drugs: Secondary | ICD-10-CM | POA: Diagnosis not present

## 2017-12-27 DIAGNOSIS — Z7952 Long term (current) use of systemic steroids: Secondary | ICD-10-CM | POA: Diagnosis not present

## 2017-12-27 DIAGNOSIS — M109 Gout, unspecified: Secondary | ICD-10-CM | POA: Diagnosis not present

## 2017-12-27 DIAGNOSIS — Z79891 Long term (current) use of opiate analgesic: Secondary | ICD-10-CM | POA: Diagnosis not present

## 2017-12-27 DIAGNOSIS — E559 Vitamin D deficiency, unspecified: Secondary | ICD-10-CM | POA: Diagnosis not present

## 2017-12-27 DIAGNOSIS — E119 Type 2 diabetes mellitus without complications: Secondary | ICD-10-CM | POA: Diagnosis not present

## 2017-12-27 DIAGNOSIS — Z7984 Long term (current) use of oral hypoglycemic drugs: Secondary | ICD-10-CM | POA: Diagnosis not present

## 2017-12-27 DIAGNOSIS — G8929 Other chronic pain: Secondary | ICD-10-CM | POA: Diagnosis not present

## 2017-12-27 DIAGNOSIS — I10 Essential (primary) hypertension: Secondary | ICD-10-CM | POA: Diagnosis not present

## 2017-12-27 DIAGNOSIS — M545 Low back pain: Secondary | ICD-10-CM | POA: Diagnosis not present

## 2017-12-27 DIAGNOSIS — Z96652 Presence of left artificial knee joint: Secondary | ICD-10-CM | POA: Diagnosis not present

## 2017-12-27 DIAGNOSIS — J45909 Unspecified asthma, uncomplicated: Secondary | ICD-10-CM | POA: Diagnosis not present

## 2017-12-27 DIAGNOSIS — E039 Hypothyroidism, unspecified: Secondary | ICD-10-CM | POA: Diagnosis not present

## 2017-12-27 DIAGNOSIS — M150 Primary generalized (osteo)arthritis: Secondary | ICD-10-CM | POA: Diagnosis not present

## 2017-12-31 DIAGNOSIS — I10 Essential (primary) hypertension: Secondary | ICD-10-CM | POA: Diagnosis not present

## 2017-12-31 DIAGNOSIS — J45909 Unspecified asthma, uncomplicated: Secondary | ICD-10-CM | POA: Diagnosis not present

## 2017-12-31 DIAGNOSIS — Z79891 Long term (current) use of opiate analgesic: Secondary | ICD-10-CM | POA: Diagnosis not present

## 2017-12-31 DIAGNOSIS — E559 Vitamin D deficiency, unspecified: Secondary | ICD-10-CM | POA: Diagnosis not present

## 2017-12-31 DIAGNOSIS — M109 Gout, unspecified: Secondary | ICD-10-CM | POA: Diagnosis not present

## 2017-12-31 DIAGNOSIS — Z7984 Long term (current) use of oral hypoglycemic drugs: Secondary | ICD-10-CM | POA: Diagnosis not present

## 2017-12-31 DIAGNOSIS — M150 Primary generalized (osteo)arthritis: Secondary | ICD-10-CM | POA: Diagnosis not present

## 2017-12-31 DIAGNOSIS — G8929 Other chronic pain: Secondary | ICD-10-CM | POA: Diagnosis not present

## 2017-12-31 DIAGNOSIS — E119 Type 2 diabetes mellitus without complications: Secondary | ICD-10-CM | POA: Diagnosis not present

## 2017-12-31 DIAGNOSIS — Z96652 Presence of left artificial knee joint: Secondary | ICD-10-CM | POA: Diagnosis not present

## 2017-12-31 DIAGNOSIS — M545 Low back pain: Secondary | ICD-10-CM | POA: Diagnosis not present

## 2017-12-31 DIAGNOSIS — Z9181 History of falling: Secondary | ICD-10-CM | POA: Diagnosis not present

## 2017-12-31 DIAGNOSIS — E039 Hypothyroidism, unspecified: Secondary | ICD-10-CM | POA: Diagnosis not present

## 2017-12-31 DIAGNOSIS — Z7952 Long term (current) use of systemic steroids: Secondary | ICD-10-CM | POA: Diagnosis not present

## 2018-01-01 DIAGNOSIS — G8929 Other chronic pain: Secondary | ICD-10-CM | POA: Diagnosis not present

## 2018-01-01 DIAGNOSIS — E119 Type 2 diabetes mellitus without complications: Secondary | ICD-10-CM | POA: Diagnosis not present

## 2018-01-01 DIAGNOSIS — I10 Essential (primary) hypertension: Secondary | ICD-10-CM | POA: Diagnosis not present

## 2018-01-01 DIAGNOSIS — J45909 Unspecified asthma, uncomplicated: Secondary | ICD-10-CM | POA: Diagnosis not present

## 2018-01-01 DIAGNOSIS — Z79891 Long term (current) use of opiate analgesic: Secondary | ICD-10-CM | POA: Diagnosis not present

## 2018-01-01 DIAGNOSIS — E039 Hypothyroidism, unspecified: Secondary | ICD-10-CM | POA: Diagnosis not present

## 2018-01-01 DIAGNOSIS — Z7952 Long term (current) use of systemic steroids: Secondary | ICD-10-CM | POA: Diagnosis not present

## 2018-01-01 DIAGNOSIS — M109 Gout, unspecified: Secondary | ICD-10-CM | POA: Diagnosis not present

## 2018-01-01 DIAGNOSIS — M545 Low back pain: Secondary | ICD-10-CM | POA: Diagnosis not present

## 2018-01-01 DIAGNOSIS — Z7984 Long term (current) use of oral hypoglycemic drugs: Secondary | ICD-10-CM | POA: Diagnosis not present

## 2018-01-01 DIAGNOSIS — Z96652 Presence of left artificial knee joint: Secondary | ICD-10-CM | POA: Diagnosis not present

## 2018-01-01 DIAGNOSIS — M150 Primary generalized (osteo)arthritis: Secondary | ICD-10-CM | POA: Diagnosis not present

## 2018-01-01 DIAGNOSIS — Z9181 History of falling: Secondary | ICD-10-CM | POA: Diagnosis not present

## 2018-01-01 DIAGNOSIS — E559 Vitamin D deficiency, unspecified: Secondary | ICD-10-CM | POA: Diagnosis not present

## 2018-01-02 DIAGNOSIS — Z7952 Long term (current) use of systemic steroids: Secondary | ICD-10-CM | POA: Diagnosis not present

## 2018-01-02 DIAGNOSIS — J45909 Unspecified asthma, uncomplicated: Secondary | ICD-10-CM | POA: Diagnosis not present

## 2018-01-02 DIAGNOSIS — Z9181 History of falling: Secondary | ICD-10-CM | POA: Diagnosis not present

## 2018-01-02 DIAGNOSIS — M545 Low back pain: Secondary | ICD-10-CM | POA: Diagnosis not present

## 2018-01-02 DIAGNOSIS — Z79891 Long term (current) use of opiate analgesic: Secondary | ICD-10-CM | POA: Diagnosis not present

## 2018-01-02 DIAGNOSIS — E119 Type 2 diabetes mellitus without complications: Secondary | ICD-10-CM | POA: Diagnosis not present

## 2018-01-02 DIAGNOSIS — Z96652 Presence of left artificial knee joint: Secondary | ICD-10-CM | POA: Diagnosis not present

## 2018-01-02 DIAGNOSIS — M109 Gout, unspecified: Secondary | ICD-10-CM | POA: Diagnosis not present

## 2018-01-02 DIAGNOSIS — E559 Vitamin D deficiency, unspecified: Secondary | ICD-10-CM | POA: Diagnosis not present

## 2018-01-02 DIAGNOSIS — Z7984 Long term (current) use of oral hypoglycemic drugs: Secondary | ICD-10-CM | POA: Diagnosis not present

## 2018-01-02 DIAGNOSIS — G8929 Other chronic pain: Secondary | ICD-10-CM | POA: Diagnosis not present

## 2018-01-02 DIAGNOSIS — E039 Hypothyroidism, unspecified: Secondary | ICD-10-CM | POA: Diagnosis not present

## 2018-01-02 DIAGNOSIS — M150 Primary generalized (osteo)arthritis: Secondary | ICD-10-CM | POA: Diagnosis not present

## 2018-01-02 DIAGNOSIS — I10 Essential (primary) hypertension: Secondary | ICD-10-CM | POA: Diagnosis not present

## 2018-01-03 DIAGNOSIS — Z79891 Long term (current) use of opiate analgesic: Secondary | ICD-10-CM | POA: Diagnosis not present

## 2018-01-03 DIAGNOSIS — Z96652 Presence of left artificial knee joint: Secondary | ICD-10-CM | POA: Diagnosis not present

## 2018-01-03 DIAGNOSIS — E119 Type 2 diabetes mellitus without complications: Secondary | ICD-10-CM | POA: Diagnosis not present

## 2018-01-03 DIAGNOSIS — Z7952 Long term (current) use of systemic steroids: Secondary | ICD-10-CM | POA: Diagnosis not present

## 2018-01-03 DIAGNOSIS — Z7984 Long term (current) use of oral hypoglycemic drugs: Secondary | ICD-10-CM | POA: Diagnosis not present

## 2018-01-03 DIAGNOSIS — J45909 Unspecified asthma, uncomplicated: Secondary | ICD-10-CM | POA: Diagnosis not present

## 2018-01-03 DIAGNOSIS — I10 Essential (primary) hypertension: Secondary | ICD-10-CM | POA: Diagnosis not present

## 2018-01-03 DIAGNOSIS — M109 Gout, unspecified: Secondary | ICD-10-CM | POA: Diagnosis not present

## 2018-01-03 DIAGNOSIS — M545 Low back pain: Secondary | ICD-10-CM | POA: Diagnosis not present

## 2018-01-03 DIAGNOSIS — M150 Primary generalized (osteo)arthritis: Secondary | ICD-10-CM | POA: Diagnosis not present

## 2018-01-03 DIAGNOSIS — E559 Vitamin D deficiency, unspecified: Secondary | ICD-10-CM | POA: Diagnosis not present

## 2018-01-03 DIAGNOSIS — Z9181 History of falling: Secondary | ICD-10-CM | POA: Diagnosis not present

## 2018-01-03 DIAGNOSIS — E039 Hypothyroidism, unspecified: Secondary | ICD-10-CM | POA: Diagnosis not present

## 2018-01-03 DIAGNOSIS — G8929 Other chronic pain: Secondary | ICD-10-CM | POA: Diagnosis not present

## 2018-01-07 DIAGNOSIS — M545 Low back pain: Secondary | ICD-10-CM | POA: Diagnosis not present

## 2018-01-07 DIAGNOSIS — E559 Vitamin D deficiency, unspecified: Secondary | ICD-10-CM | POA: Diagnosis not present

## 2018-01-07 DIAGNOSIS — E039 Hypothyroidism, unspecified: Secondary | ICD-10-CM | POA: Diagnosis not present

## 2018-01-07 DIAGNOSIS — Z96652 Presence of left artificial knee joint: Secondary | ICD-10-CM | POA: Diagnosis not present

## 2018-01-07 DIAGNOSIS — M150 Primary generalized (osteo)arthritis: Secondary | ICD-10-CM | POA: Diagnosis not present

## 2018-01-07 DIAGNOSIS — Z7952 Long term (current) use of systemic steroids: Secondary | ICD-10-CM | POA: Diagnosis not present

## 2018-01-07 DIAGNOSIS — E119 Type 2 diabetes mellitus without complications: Secondary | ICD-10-CM | POA: Diagnosis not present

## 2018-01-07 DIAGNOSIS — Z7984 Long term (current) use of oral hypoglycemic drugs: Secondary | ICD-10-CM | POA: Diagnosis not present

## 2018-01-07 DIAGNOSIS — Z79891 Long term (current) use of opiate analgesic: Secondary | ICD-10-CM | POA: Diagnosis not present

## 2018-01-07 DIAGNOSIS — M109 Gout, unspecified: Secondary | ICD-10-CM | POA: Diagnosis not present

## 2018-01-07 DIAGNOSIS — Z9181 History of falling: Secondary | ICD-10-CM | POA: Diagnosis not present

## 2018-01-07 DIAGNOSIS — I10 Essential (primary) hypertension: Secondary | ICD-10-CM | POA: Diagnosis not present

## 2018-01-07 DIAGNOSIS — J45909 Unspecified asthma, uncomplicated: Secondary | ICD-10-CM | POA: Diagnosis not present

## 2018-01-07 DIAGNOSIS — G8929 Other chronic pain: Secondary | ICD-10-CM | POA: Diagnosis not present

## 2018-01-09 DIAGNOSIS — M150 Primary generalized (osteo)arthritis: Secondary | ICD-10-CM | POA: Diagnosis not present

## 2018-01-09 DIAGNOSIS — G8929 Other chronic pain: Secondary | ICD-10-CM | POA: Diagnosis not present

## 2018-01-09 DIAGNOSIS — Z79891 Long term (current) use of opiate analgesic: Secondary | ICD-10-CM | POA: Diagnosis not present

## 2018-01-09 DIAGNOSIS — E559 Vitamin D deficiency, unspecified: Secondary | ICD-10-CM | POA: Diagnosis not present

## 2018-01-09 DIAGNOSIS — I10 Essential (primary) hypertension: Secondary | ICD-10-CM | POA: Diagnosis not present

## 2018-01-09 DIAGNOSIS — J45909 Unspecified asthma, uncomplicated: Secondary | ICD-10-CM | POA: Diagnosis not present

## 2018-01-09 DIAGNOSIS — Z9181 History of falling: Secondary | ICD-10-CM | POA: Diagnosis not present

## 2018-01-09 DIAGNOSIS — E119 Type 2 diabetes mellitus without complications: Secondary | ICD-10-CM | POA: Diagnosis not present

## 2018-01-09 DIAGNOSIS — E039 Hypothyroidism, unspecified: Secondary | ICD-10-CM | POA: Diagnosis not present

## 2018-01-09 DIAGNOSIS — M545 Low back pain: Secondary | ICD-10-CM | POA: Diagnosis not present

## 2018-01-09 DIAGNOSIS — Z7952 Long term (current) use of systemic steroids: Secondary | ICD-10-CM | POA: Diagnosis not present

## 2018-01-09 DIAGNOSIS — Z96652 Presence of left artificial knee joint: Secondary | ICD-10-CM | POA: Diagnosis not present

## 2018-01-09 DIAGNOSIS — M109 Gout, unspecified: Secondary | ICD-10-CM | POA: Diagnosis not present

## 2018-01-09 DIAGNOSIS — Z7984 Long term (current) use of oral hypoglycemic drugs: Secondary | ICD-10-CM | POA: Diagnosis not present

## 2018-01-10 DIAGNOSIS — Z9181 History of falling: Secondary | ICD-10-CM | POA: Diagnosis not present

## 2018-01-10 DIAGNOSIS — E119 Type 2 diabetes mellitus without complications: Secondary | ICD-10-CM | POA: Diagnosis not present

## 2018-01-10 DIAGNOSIS — M150 Primary generalized (osteo)arthritis: Secondary | ICD-10-CM | POA: Diagnosis not present

## 2018-01-10 DIAGNOSIS — I10 Essential (primary) hypertension: Secondary | ICD-10-CM | POA: Diagnosis not present

## 2018-01-10 DIAGNOSIS — Z79891 Long term (current) use of opiate analgesic: Secondary | ICD-10-CM | POA: Diagnosis not present

## 2018-01-10 DIAGNOSIS — Z96652 Presence of left artificial knee joint: Secondary | ICD-10-CM | POA: Diagnosis not present

## 2018-01-10 DIAGNOSIS — E039 Hypothyroidism, unspecified: Secondary | ICD-10-CM | POA: Diagnosis not present

## 2018-01-10 DIAGNOSIS — M545 Low back pain: Secondary | ICD-10-CM | POA: Diagnosis not present

## 2018-01-10 DIAGNOSIS — Z7984 Long term (current) use of oral hypoglycemic drugs: Secondary | ICD-10-CM | POA: Diagnosis not present

## 2018-01-10 DIAGNOSIS — E559 Vitamin D deficiency, unspecified: Secondary | ICD-10-CM | POA: Diagnosis not present

## 2018-01-10 DIAGNOSIS — Z7952 Long term (current) use of systemic steroids: Secondary | ICD-10-CM | POA: Diagnosis not present

## 2018-01-10 DIAGNOSIS — J45909 Unspecified asthma, uncomplicated: Secondary | ICD-10-CM | POA: Diagnosis not present

## 2018-01-10 DIAGNOSIS — M109 Gout, unspecified: Secondary | ICD-10-CM | POA: Diagnosis not present

## 2018-01-10 DIAGNOSIS — G8929 Other chronic pain: Secondary | ICD-10-CM | POA: Diagnosis not present

## 2018-01-13 DIAGNOSIS — Z7984 Long term (current) use of oral hypoglycemic drugs: Secondary | ICD-10-CM | POA: Diagnosis not present

## 2018-01-13 DIAGNOSIS — E119 Type 2 diabetes mellitus without complications: Secondary | ICD-10-CM | POA: Diagnosis not present

## 2018-01-13 DIAGNOSIS — M150 Primary generalized (osteo)arthritis: Secondary | ICD-10-CM | POA: Diagnosis not present

## 2018-01-13 DIAGNOSIS — Z79891 Long term (current) use of opiate analgesic: Secondary | ICD-10-CM | POA: Diagnosis not present

## 2018-01-13 DIAGNOSIS — Z7952 Long term (current) use of systemic steroids: Secondary | ICD-10-CM | POA: Diagnosis not present

## 2018-01-13 DIAGNOSIS — E559 Vitamin D deficiency, unspecified: Secondary | ICD-10-CM | POA: Diagnosis not present

## 2018-01-13 DIAGNOSIS — G8929 Other chronic pain: Secondary | ICD-10-CM | POA: Diagnosis not present

## 2018-01-13 DIAGNOSIS — I10 Essential (primary) hypertension: Secondary | ICD-10-CM | POA: Diagnosis not present

## 2018-01-13 DIAGNOSIS — J45909 Unspecified asthma, uncomplicated: Secondary | ICD-10-CM | POA: Diagnosis not present

## 2018-01-13 DIAGNOSIS — Z96652 Presence of left artificial knee joint: Secondary | ICD-10-CM | POA: Diagnosis not present

## 2018-01-13 DIAGNOSIS — E039 Hypothyroidism, unspecified: Secondary | ICD-10-CM | POA: Diagnosis not present

## 2018-01-13 DIAGNOSIS — Z9181 History of falling: Secondary | ICD-10-CM | POA: Diagnosis not present

## 2018-01-13 DIAGNOSIS — M545 Low back pain: Secondary | ICD-10-CM | POA: Diagnosis not present

## 2018-01-13 DIAGNOSIS — M109 Gout, unspecified: Secondary | ICD-10-CM | POA: Diagnosis not present

## 2018-01-14 DIAGNOSIS — E559 Vitamin D deficiency, unspecified: Secondary | ICD-10-CM | POA: Diagnosis not present

## 2018-01-14 DIAGNOSIS — Z7952 Long term (current) use of systemic steroids: Secondary | ICD-10-CM | POA: Diagnosis not present

## 2018-01-14 DIAGNOSIS — Z79891 Long term (current) use of opiate analgesic: Secondary | ICD-10-CM | POA: Diagnosis not present

## 2018-01-14 DIAGNOSIS — Z7984 Long term (current) use of oral hypoglycemic drugs: Secondary | ICD-10-CM | POA: Diagnosis not present

## 2018-01-14 DIAGNOSIS — M150 Primary generalized (osteo)arthritis: Secondary | ICD-10-CM | POA: Diagnosis not present

## 2018-01-14 DIAGNOSIS — I10 Essential (primary) hypertension: Secondary | ICD-10-CM | POA: Diagnosis not present

## 2018-01-14 DIAGNOSIS — Z9181 History of falling: Secondary | ICD-10-CM | POA: Diagnosis not present

## 2018-01-14 DIAGNOSIS — Z96652 Presence of left artificial knee joint: Secondary | ICD-10-CM | POA: Diagnosis not present

## 2018-01-14 DIAGNOSIS — G8929 Other chronic pain: Secondary | ICD-10-CM | POA: Diagnosis not present

## 2018-01-14 DIAGNOSIS — M545 Low back pain: Secondary | ICD-10-CM | POA: Diagnosis not present

## 2018-01-14 DIAGNOSIS — E119 Type 2 diabetes mellitus without complications: Secondary | ICD-10-CM | POA: Diagnosis not present

## 2018-01-14 DIAGNOSIS — E039 Hypothyroidism, unspecified: Secondary | ICD-10-CM | POA: Diagnosis not present

## 2018-01-14 DIAGNOSIS — J45909 Unspecified asthma, uncomplicated: Secondary | ICD-10-CM | POA: Diagnosis not present

## 2018-01-14 DIAGNOSIS — M109 Gout, unspecified: Secondary | ICD-10-CM | POA: Diagnosis not present

## 2018-01-16 DIAGNOSIS — M109 Gout, unspecified: Secondary | ICD-10-CM | POA: Diagnosis not present

## 2018-01-16 DIAGNOSIS — E559 Vitamin D deficiency, unspecified: Secondary | ICD-10-CM | POA: Diagnosis not present

## 2018-01-16 DIAGNOSIS — Z96652 Presence of left artificial knee joint: Secondary | ICD-10-CM | POA: Diagnosis not present

## 2018-01-16 DIAGNOSIS — M545 Low back pain: Secondary | ICD-10-CM | POA: Diagnosis not present

## 2018-01-16 DIAGNOSIS — Z7984 Long term (current) use of oral hypoglycemic drugs: Secondary | ICD-10-CM | POA: Diagnosis not present

## 2018-01-16 DIAGNOSIS — Z7952 Long term (current) use of systemic steroids: Secondary | ICD-10-CM | POA: Diagnosis not present

## 2018-01-16 DIAGNOSIS — G8929 Other chronic pain: Secondary | ICD-10-CM | POA: Diagnosis not present

## 2018-01-16 DIAGNOSIS — M150 Primary generalized (osteo)arthritis: Secondary | ICD-10-CM | POA: Diagnosis not present

## 2018-01-16 DIAGNOSIS — Z79891 Long term (current) use of opiate analgesic: Secondary | ICD-10-CM | POA: Diagnosis not present

## 2018-01-16 DIAGNOSIS — E039 Hypothyroidism, unspecified: Secondary | ICD-10-CM | POA: Diagnosis not present

## 2018-01-16 DIAGNOSIS — I10 Essential (primary) hypertension: Secondary | ICD-10-CM | POA: Diagnosis not present

## 2018-01-16 DIAGNOSIS — E119 Type 2 diabetes mellitus without complications: Secondary | ICD-10-CM | POA: Diagnosis not present

## 2018-01-16 DIAGNOSIS — J45909 Unspecified asthma, uncomplicated: Secondary | ICD-10-CM | POA: Diagnosis not present

## 2018-01-16 DIAGNOSIS — Z9181 History of falling: Secondary | ICD-10-CM | POA: Diagnosis not present

## 2018-01-17 DIAGNOSIS — M545 Low back pain: Secondary | ICD-10-CM | POA: Diagnosis not present

## 2018-01-17 DIAGNOSIS — E559 Vitamin D deficiency, unspecified: Secondary | ICD-10-CM | POA: Diagnosis not present

## 2018-01-17 DIAGNOSIS — Z9181 History of falling: Secondary | ICD-10-CM | POA: Diagnosis not present

## 2018-01-17 DIAGNOSIS — J45909 Unspecified asthma, uncomplicated: Secondary | ICD-10-CM | POA: Diagnosis not present

## 2018-01-17 DIAGNOSIS — I10 Essential (primary) hypertension: Secondary | ICD-10-CM | POA: Diagnosis not present

## 2018-01-17 DIAGNOSIS — G8929 Other chronic pain: Secondary | ICD-10-CM | POA: Diagnosis not present

## 2018-01-17 DIAGNOSIS — E039 Hypothyroidism, unspecified: Secondary | ICD-10-CM | POA: Diagnosis not present

## 2018-01-17 DIAGNOSIS — E119 Type 2 diabetes mellitus without complications: Secondary | ICD-10-CM | POA: Diagnosis not present

## 2018-01-17 DIAGNOSIS — Z96652 Presence of left artificial knee joint: Secondary | ICD-10-CM | POA: Diagnosis not present

## 2018-01-17 DIAGNOSIS — M150 Primary generalized (osteo)arthritis: Secondary | ICD-10-CM | POA: Diagnosis not present

## 2018-01-17 DIAGNOSIS — Z7952 Long term (current) use of systemic steroids: Secondary | ICD-10-CM | POA: Diagnosis not present

## 2018-01-17 DIAGNOSIS — M109 Gout, unspecified: Secondary | ICD-10-CM | POA: Diagnosis not present

## 2018-01-17 DIAGNOSIS — Z7984 Long term (current) use of oral hypoglycemic drugs: Secondary | ICD-10-CM | POA: Diagnosis not present

## 2018-01-17 DIAGNOSIS — Z79891 Long term (current) use of opiate analgesic: Secondary | ICD-10-CM | POA: Diagnosis not present

## 2018-01-23 DIAGNOSIS — H538 Other visual disturbances: Secondary | ICD-10-CM | POA: Diagnosis not present

## 2018-01-23 DIAGNOSIS — E119 Type 2 diabetes mellitus without complications: Secondary | ICD-10-CM | POA: Diagnosis not present

## 2018-01-27 DIAGNOSIS — I10 Essential (primary) hypertension: Secondary | ICD-10-CM | POA: Diagnosis not present

## 2018-01-27 DIAGNOSIS — M48062 Spinal stenosis, lumbar region with neurogenic claudication: Secondary | ICD-10-CM | POA: Diagnosis not present

## 2018-01-27 DIAGNOSIS — M4316 Spondylolisthesis, lumbar region: Secondary | ICD-10-CM | POA: Diagnosis not present

## 2018-01-30 ENCOUNTER — Other Ambulatory Visit: Payer: Self-pay | Admitting: Neurosurgery

## 2018-01-30 DIAGNOSIS — M4316 Spondylolisthesis, lumbar region: Secondary | ICD-10-CM

## 2018-02-08 ENCOUNTER — Other Ambulatory Visit: Payer: Medicare Other

## 2018-02-08 ENCOUNTER — Ambulatory Visit
Admission: RE | Admit: 2018-02-08 | Discharge: 2018-02-08 | Disposition: A | Discharge status: Home / Self Care | Payer: Medicare Other | Source: Ambulatory Visit | Attending: Neurosurgery | Admitting: Neurosurgery

## 2018-02-08 DIAGNOSIS — M4316 Spondylolisthesis, lumbar region: Secondary | ICD-10-CM

## 2018-02-08 DIAGNOSIS — M48061 Spinal stenosis, lumbar region without neurogenic claudication: Secondary | ICD-10-CM | POA: Diagnosis not present

## 2018-02-26 DIAGNOSIS — M109 Gout, unspecified: Secondary | ICD-10-CM | POA: Diagnosis not present

## 2018-02-26 DIAGNOSIS — M4800 Spinal stenosis, site unspecified: Secondary | ICD-10-CM | POA: Diagnosis not present

## 2018-02-26 DIAGNOSIS — M25439 Effusion, unspecified wrist: Secondary | ICD-10-CM | POA: Diagnosis not present

## 2018-02-26 DIAGNOSIS — M5416 Radiculopathy, lumbar region: Secondary | ICD-10-CM | POA: Diagnosis not present

## 2018-02-26 DIAGNOSIS — M545 Low back pain: Secondary | ICD-10-CM | POA: Diagnosis not present

## 2018-03-10 DIAGNOSIS — M4316 Spondylolisthesis, lumbar region: Secondary | ICD-10-CM | POA: Diagnosis not present

## 2018-03-14 DIAGNOSIS — M109 Gout, unspecified: Secondary | ICD-10-CM | POA: Diagnosis not present

## 2018-03-14 DIAGNOSIS — M1990 Unspecified osteoarthritis, unspecified site: Secondary | ICD-10-CM | POA: Diagnosis not present

## 2018-03-14 DIAGNOSIS — M25439 Effusion, unspecified wrist: Secondary | ICD-10-CM | POA: Diagnosis not present

## 2018-03-14 DIAGNOSIS — M5416 Radiculopathy, lumbar region: Secondary | ICD-10-CM | POA: Diagnosis not present

## 2018-03-14 DIAGNOSIS — M545 Low back pain: Secondary | ICD-10-CM | POA: Diagnosis not present

## 2018-04-15 DIAGNOSIS — M79641 Pain in right hand: Secondary | ICD-10-CM | POA: Diagnosis not present

## 2018-04-15 DIAGNOSIS — M109 Gout, unspecified: Secondary | ICD-10-CM | POA: Diagnosis not present

## 2018-04-15 DIAGNOSIS — M25561 Pain in right knee: Secondary | ICD-10-CM | POA: Diagnosis not present

## 2018-04-15 DIAGNOSIS — M5416 Radiculopathy, lumbar region: Secondary | ICD-10-CM | POA: Diagnosis not present

## 2018-04-15 DIAGNOSIS — M25569 Pain in unspecified knee: Secondary | ICD-10-CM | POA: Diagnosis not present

## 2018-04-15 DIAGNOSIS — M1990 Unspecified osteoarthritis, unspecified site: Secondary | ICD-10-CM | POA: Diagnosis not present

## 2018-04-15 DIAGNOSIS — M79642 Pain in left hand: Secondary | ICD-10-CM | POA: Diagnosis not present

## 2018-04-15 DIAGNOSIS — M19042 Primary osteoarthritis, left hand: Secondary | ICD-10-CM | POA: Diagnosis not present

## 2018-04-15 DIAGNOSIS — M1711 Unilateral primary osteoarthritis, right knee: Secondary | ICD-10-CM | POA: Diagnosis not present

## 2018-04-15 DIAGNOSIS — M545 Low back pain: Secondary | ICD-10-CM | POA: Diagnosis not present

## 2018-04-15 DIAGNOSIS — M1712 Unilateral primary osteoarthritis, left knee: Secondary | ICD-10-CM | POA: Diagnosis not present

## 2018-04-15 DIAGNOSIS — M19041 Primary osteoarthritis, right hand: Secondary | ICD-10-CM | POA: Diagnosis not present

## 2018-05-06 DIAGNOSIS — Z961 Presence of intraocular lens: Secondary | ICD-10-CM | POA: Diagnosis not present

## 2018-05-06 DIAGNOSIS — E119 Type 2 diabetes mellitus without complications: Secondary | ICD-10-CM | POA: Diagnosis not present

## 2018-05-06 DIAGNOSIS — H353111 Nonexudative age-related macular degeneration, right eye, early dry stage: Secondary | ICD-10-CM | POA: Diagnosis not present

## 2018-05-06 DIAGNOSIS — H35372 Puckering of macula, left eye: Secondary | ICD-10-CM | POA: Diagnosis not present

## 2018-05-06 DIAGNOSIS — H25811 Combined forms of age-related cataract, right eye: Secondary | ICD-10-CM | POA: Diagnosis not present

## 2018-05-30 DIAGNOSIS — H2511 Age-related nuclear cataract, right eye: Secondary | ICD-10-CM | POA: Diagnosis not present

## 2018-06-04 DIAGNOSIS — H2511 Age-related nuclear cataract, right eye: Secondary | ICD-10-CM | POA: Diagnosis not present

## 2018-06-04 DIAGNOSIS — H25811 Combined forms of age-related cataract, right eye: Secondary | ICD-10-CM | POA: Diagnosis not present

## 2018-06-11 DIAGNOSIS — Z79899 Other long term (current) drug therapy: Secondary | ICD-10-CM | POA: Diagnosis not present

## 2018-06-11 DIAGNOSIS — E119 Type 2 diabetes mellitus without complications: Secondary | ICD-10-CM | POA: Diagnosis not present

## 2018-06-11 DIAGNOSIS — M25561 Pain in right knee: Secondary | ICD-10-CM | POA: Diagnosis not present

## 2018-06-11 DIAGNOSIS — E559 Vitamin D deficiency, unspecified: Secondary | ICD-10-CM | POA: Diagnosis not present

## 2018-06-25 DIAGNOSIS — M1990 Unspecified osteoarthritis, unspecified site: Secondary | ICD-10-CM | POA: Diagnosis not present

## 2018-06-25 DIAGNOSIS — E119 Type 2 diabetes mellitus without complications: Secondary | ICD-10-CM | POA: Diagnosis not present

## 2018-06-25 DIAGNOSIS — I10 Essential (primary) hypertension: Secondary | ICD-10-CM | POA: Diagnosis not present

## 2018-06-25 DIAGNOSIS — E7800 Pure hypercholesterolemia, unspecified: Secondary | ICD-10-CM | POA: Diagnosis not present

## 2018-06-25 DIAGNOSIS — Z0000 Encounter for general adult medical examination without abnormal findings: Secondary | ICD-10-CM | POA: Diagnosis not present

## 2018-07-03 DIAGNOSIS — Z961 Presence of intraocular lens: Secondary | ICD-10-CM | POA: Diagnosis not present

## 2018-08-01 ENCOUNTER — Other Ambulatory Visit: Payer: Self-pay

## 2018-08-01 NOTE — Patient Outreach (Signed)
[facility]) Care Management  08/01/2018  Julie [name] 1934/11/18 [id number]   Medication Adherence call to Julie [name] spoke with patient she did not want to engage or provide any information patient is due on Metformin ER 500 mg under [facility].   Lillia Abed CPhT Pharmacy Technician [facility] Care Management Direct Dial [phone]  Fax [phone] [email or web address]

## 2018-11-03 DIAGNOSIS — Z79899 Other long term (current) drug therapy: Secondary | ICD-10-CM | POA: Diagnosis not present

## 2018-11-03 DIAGNOSIS — M1990 Unspecified osteoarthritis, unspecified site: Secondary | ICD-10-CM | POA: Diagnosis not present

## 2018-11-17 DIAGNOSIS — M25561 Pain in right knee: Secondary | ICD-10-CM | POA: Diagnosis not present

## 2018-11-21 IMAGING — CT CT CHEST W/ CM
2 of 5 series · 12 of 36 positions shown, 18 images · IV contrast (READICAT/WATER & 70CC ISOVUE 300)
Comparison: None.

CLINICAL DATA: Weight loss. History of thyroidectomy and ovarian
cyst removal. Nausea.

EXAM:
CT CHEST WITH CONTRAST
CT ABDOMEN AND PELVIS WITH CONTRAST
TECHNIQUE: Multidetector CT imaging of the chest was performed during
intravenous contrast administration. Multidetector CT imaging of the
abdomen and pelvis was performed following the standard protocol
before and during bolus administration of intravenous contrast.
CONTRAST:  70mL QJI5QG-RMM IOPAMIDOL (QJI5QG-RMM) INJECTION 61%

[Series 601: coronal body · coronal · 1.13mm/px · 1 of 140 slices shown, 2 images]
[im 47/140  soft-tissue]
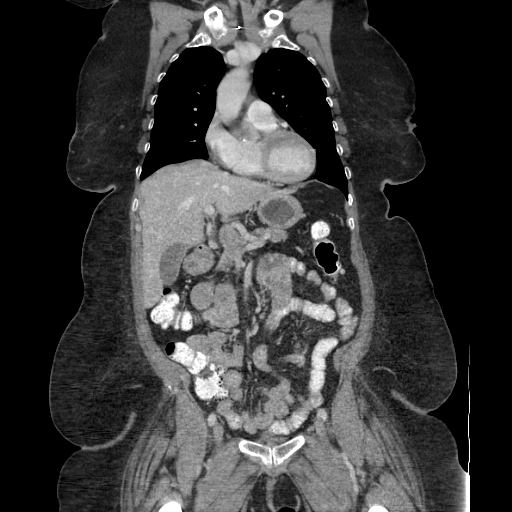
[im 47/140  bone]
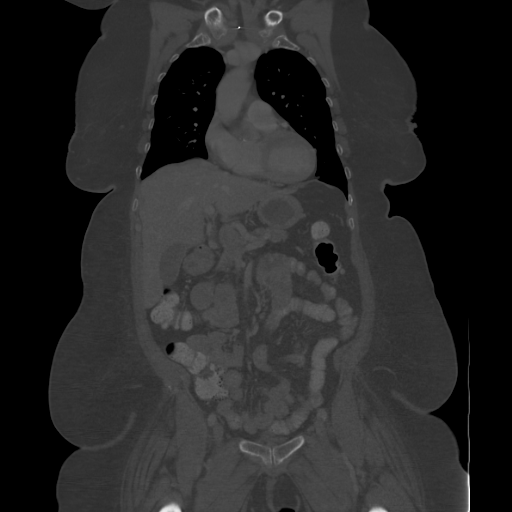

[Series 602: sagittal body · sagittal · 1.13mm/px · 11 of 200 slices shown, 16 images]
[im 16/200  soft-tissue]
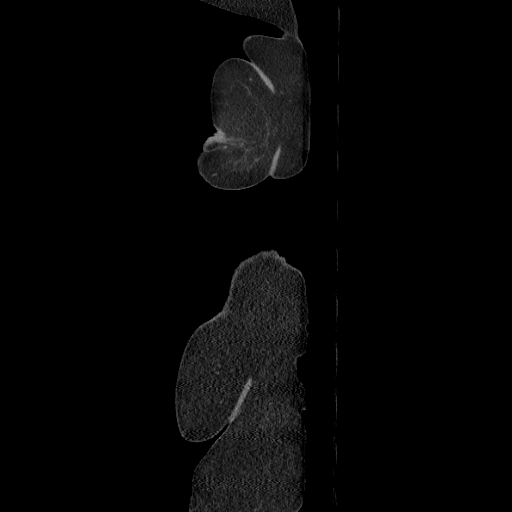
[im 16/200  lung]
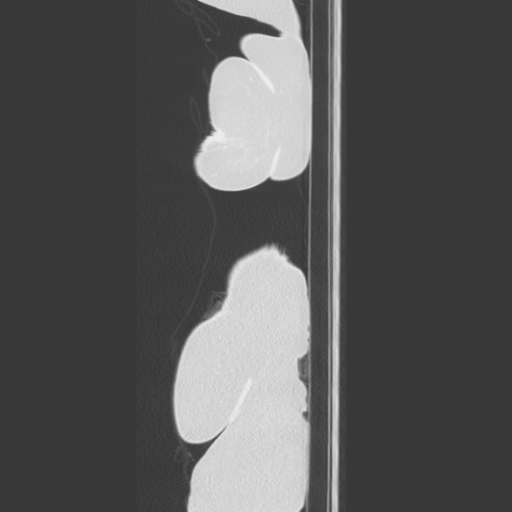
[im 16/200  bone]
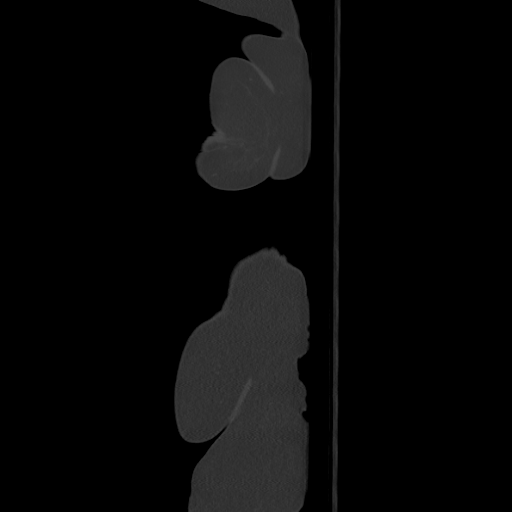
[im 31/200  soft-tissue]
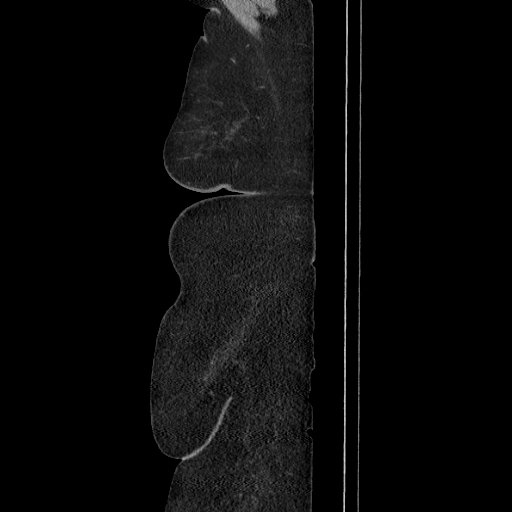
[im 31/200  lung]
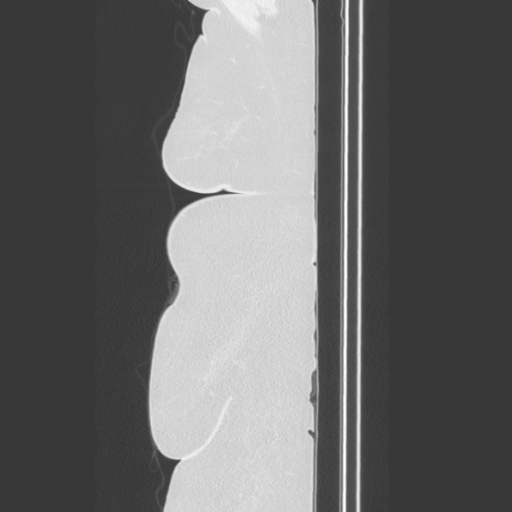
[im 46/200  lung]
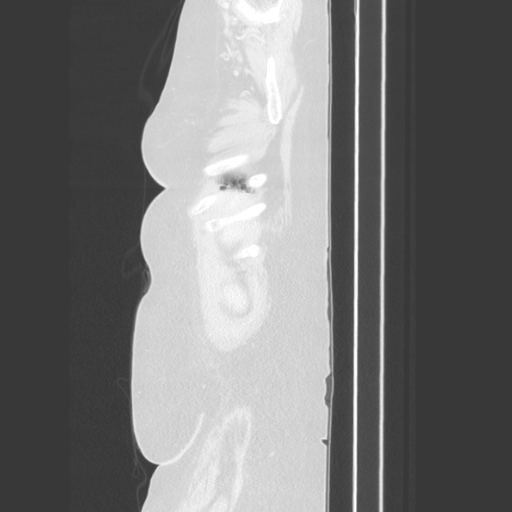
[im 62/200  soft-tissue]
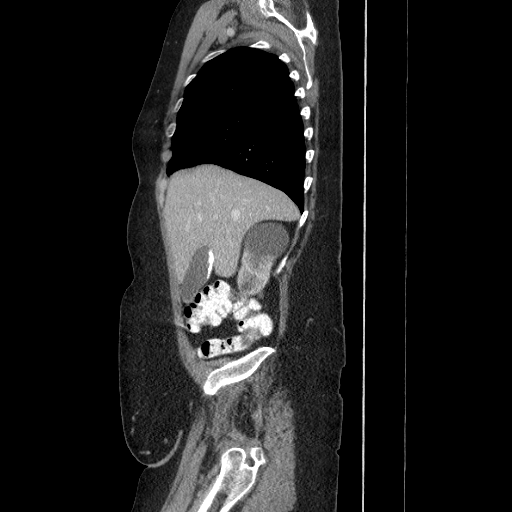
[im 62/200  lung]
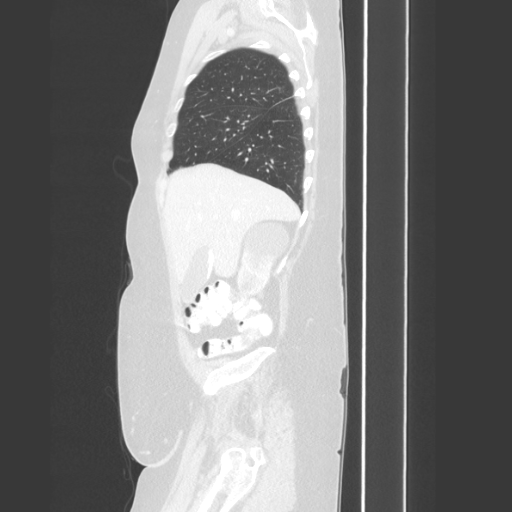
[im 77/200  soft-tissue]
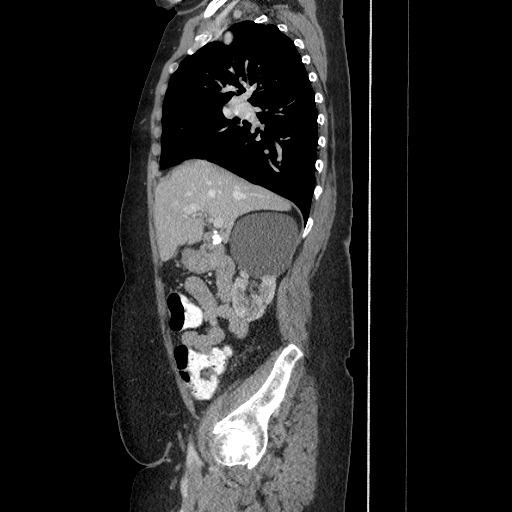
[im 92/200  soft-tissue]
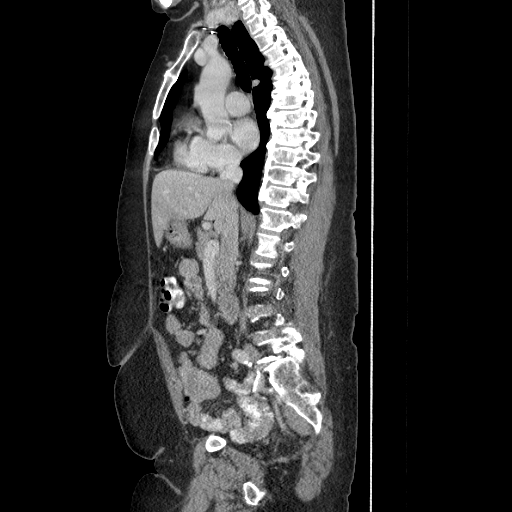
[im 108/200  soft-tissue]
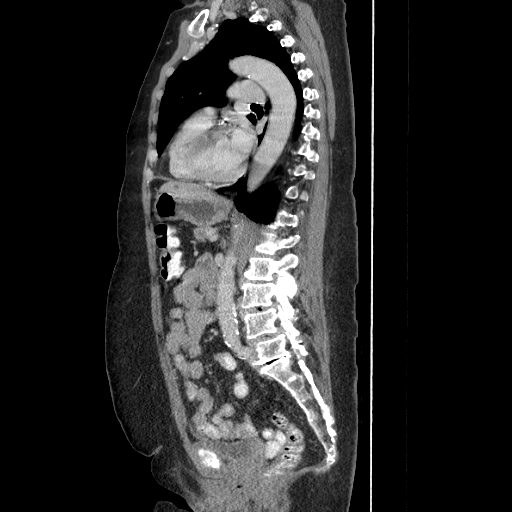
[im 123/200  soft-tissue]
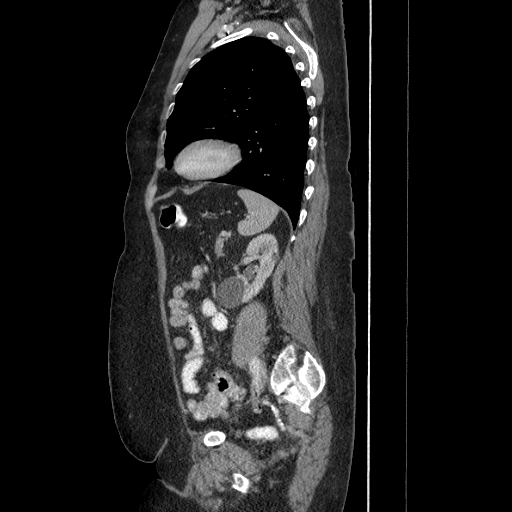
[im 154/200  soft-tissue]
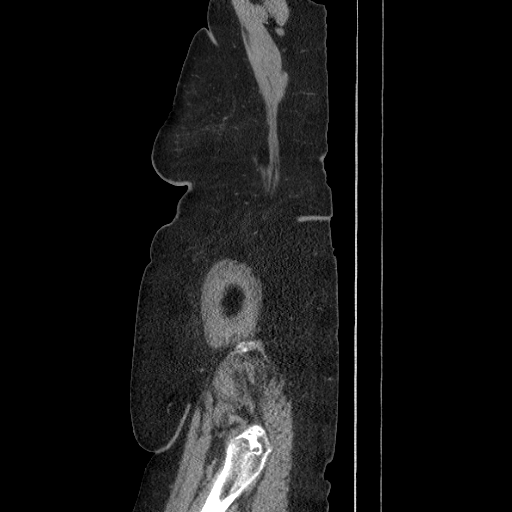
[im 169/200  soft-tissue]
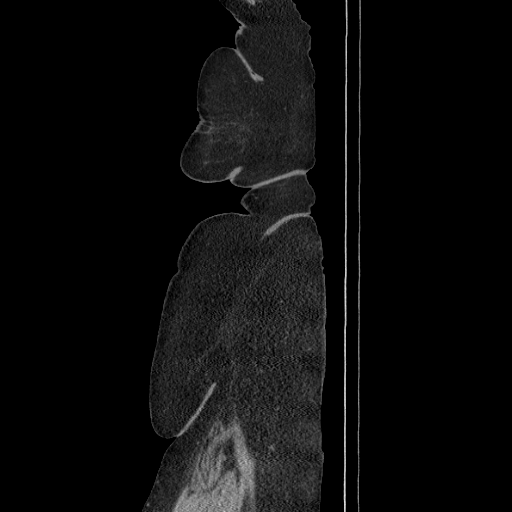
[im 169/200  bone]
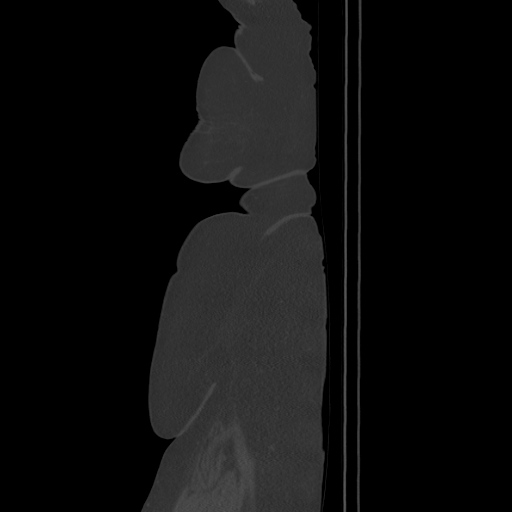
[im 184/200  soft-tissue]
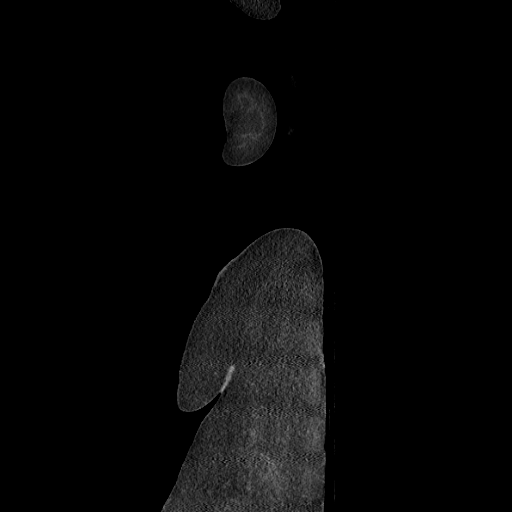

[12 of 36 positions shown; findings below may reference images not displayed]

FINDINGS: CT CHEST FINDINGS

Cardiovascular: The heart is normal in size. No pericardial
effusion. There is calcific atherosclerotic disease of the coronary
arteries.

Mediastinum/Nodes: No enlarged mediastinal, hilar, or axillary lymph
nodes. The trachea, and esophagus demonstrate no significant
findings. There has been a prior left thyroidectomy. There is an
ill-defined hypoattenuated lesion within the inferior portion of the
right thyroid gland measuring approximately 2.1 cm.

Lungs/Pleura: Lungs are clear. No pleural effusion or pneumothorax.

Musculoskeletal: No chest wall mass or suspicious bone lesions
identified.

CT ABDOMEN AND PELVIS FINDINGS

Hepatobiliary: Several less than 5 mm hypoattenuated lesions
throughout the liver, too small to be accurately characterize by CT.
Layering sludge and gallstones within the abnormally distended
gallbladder. No evidence of biliary dilation.

Pancreas: Unremarkable. No pancreatic ductal dilatation or
surrounding inflammatory changes.

Spleen: Normal in size without focal abnormality.

Adrenals/Urinary Tract: The adrenal glands are normal. There are
numerous bilateral renal cysts, including partially exophytic 7.3 cm
cyst off of the superior pole of the right kidney.

Stomach/Bowel: Stomach is within normal limits. No evidence of bowel
wall thickening, distention, or inflammatory changes. Scattered
colonic diverticular without evidence of diverticulitis.

Vascular/Lymphatic: Aortic atherosclerosis. No enlarged abdominal or
pelvic lymph nodes.

Reproductive: Status post hysterectomy. No adnexal masses.

Other: No abdominal wall hernia or abnormality. No abdominopelvic
ascites.

Musculoskeletal: Multilevel osteoarthritic changes of the
lumbosacral spine. L4-L5 eccentric to the right broad-based
posterior disc protrusion.
IMPRESSION: Numerous less than 5 mm hypoattenuated lesions throughout the liver,
too small to be actually characterize by CT.

Benign-appearing bilateral renal cysts, including 7.3 cm exophytic
right superior pole renal cyst.

Scattered colonic diverticulitis without evidence of diverticulosis.

2.1 cm right thyroid gland hypoattenuated lesion. Further evaluation
with thyroid ultrasound may be considered if found clinically
necessary.

Calcific atherosclerotic disease of the coronary arteries and aorta.

Multilevel osteoarthritic changes of the lumbosacral spine,
particularly prominent at L3-L4 and L4-L5.

## 2018-12-17 DIAGNOSIS — I10 Essential (primary) hypertension: Secondary | ICD-10-CM | POA: Diagnosis not present

## 2018-12-17 DIAGNOSIS — D509 Iron deficiency anemia, unspecified: Secondary | ICD-10-CM | POA: Diagnosis not present

## 2018-12-17 DIAGNOSIS — E7800 Pure hypercholesterolemia, unspecified: Secondary | ICD-10-CM | POA: Diagnosis not present

## 2018-12-17 DIAGNOSIS — E119 Type 2 diabetes mellitus without complications: Secondary | ICD-10-CM | POA: Diagnosis not present

## 2018-12-17 DIAGNOSIS — M109 Gout, unspecified: Secondary | ICD-10-CM | POA: Diagnosis not present

## 2018-12-24 DIAGNOSIS — I10 Essential (primary) hypertension: Secondary | ICD-10-CM | POA: Diagnosis not present

## 2018-12-24 DIAGNOSIS — Z23 Encounter for immunization: Secondary | ICD-10-CM | POA: Diagnosis not present

## 2018-12-24 DIAGNOSIS — E118 Type 2 diabetes mellitus with unspecified complications: Secondary | ICD-10-CM | POA: Diagnosis not present

## 2018-12-24 DIAGNOSIS — E7800 Pure hypercholesterolemia, unspecified: Secondary | ICD-10-CM | POA: Diagnosis not present

## 2019-01-30 DIAGNOSIS — M1990 Unspecified osteoarthritis, unspecified site: Secondary | ICD-10-CM | POA: Diagnosis not present

## 2019-01-30 DIAGNOSIS — M5136 Other intervertebral disc degeneration, lumbar region: Secondary | ICD-10-CM | POA: Diagnosis not present

## 2019-01-30 DIAGNOSIS — M109 Gout, unspecified: Secondary | ICD-10-CM | POA: Diagnosis not present

## 2019-01-30 DIAGNOSIS — M25561 Pain in right knee: Secondary | ICD-10-CM | POA: Diagnosis not present

## 2019-01-30 DIAGNOSIS — M545 Low back pain: Secondary | ICD-10-CM | POA: Diagnosis not present

## 2019-01-30 DIAGNOSIS — M5416 Radiculopathy, lumbar region: Secondary | ICD-10-CM | POA: Diagnosis not present

## 2019-07-30 DIAGNOSIS — E119 Type 2 diabetes mellitus without complications: Secondary | ICD-10-CM | POA: Diagnosis not present

## 2019-07-30 DIAGNOSIS — Z961 Presence of intraocular lens: Secondary | ICD-10-CM | POA: Diagnosis not present

## 2020-01-15 DIAGNOSIS — I10 Essential (primary) hypertension: Secondary | ICD-10-CM | POA: Diagnosis not present

## 2020-01-15 DIAGNOSIS — E559 Vitamin D deficiency, unspecified: Secondary | ICD-10-CM | POA: Diagnosis not present

## 2020-01-15 DIAGNOSIS — E118 Type 2 diabetes mellitus with unspecified complications: Secondary | ICD-10-CM | POA: Diagnosis not present

## 2020-01-15 DIAGNOSIS — Z0000 Encounter for general adult medical examination without abnormal findings: Secondary | ICD-10-CM | POA: Diagnosis not present

## 2020-01-15 DIAGNOSIS — E039 Hypothyroidism, unspecified: Secondary | ICD-10-CM | POA: Diagnosis not present

## 2020-01-15 DIAGNOSIS — M1990 Unspecified osteoarthritis, unspecified site: Secondary | ICD-10-CM | POA: Diagnosis not present

## 2020-01-15 DIAGNOSIS — M109 Gout, unspecified: Secondary | ICD-10-CM | POA: Diagnosis not present

## 2020-06-22 DIAGNOSIS — M25561 Pain in right knee: Secondary | ICD-10-CM | POA: Diagnosis not present

## 2020-06-22 DIAGNOSIS — M109 Gout, unspecified: Secondary | ICD-10-CM | POA: Diagnosis not present

## 2020-06-22 DIAGNOSIS — M1711 Unilateral primary osteoarthritis, right knee: Secondary | ICD-10-CM | POA: Diagnosis not present

## 2020-12-21 DIAGNOSIS — Z0000 Encounter for general adult medical examination without abnormal findings: Secondary | ICD-10-CM | POA: Diagnosis not present

## 2020-12-21 DIAGNOSIS — E039 Hypothyroidism, unspecified: Secondary | ICD-10-CM | POA: Diagnosis not present

## 2020-12-21 DIAGNOSIS — R2681 Unsteadiness on feet: Secondary | ICD-10-CM | POA: Diagnosis not present

## 2020-12-21 DIAGNOSIS — D649 Anemia, unspecified: Secondary | ICD-10-CM | POA: Diagnosis not present

## 2020-12-21 DIAGNOSIS — E118 Type 2 diabetes mellitus with unspecified complications: Secondary | ICD-10-CM | POA: Diagnosis not present

## 2020-12-21 DIAGNOSIS — E119 Type 2 diabetes mellitus without complications: Secondary | ICD-10-CM | POA: Diagnosis not present

## 2020-12-21 DIAGNOSIS — M5136 Other intervertebral disc degeneration, lumbar region: Secondary | ICD-10-CM | POA: Diagnosis not present

## 2020-12-21 DIAGNOSIS — M109 Gout, unspecified: Secondary | ICD-10-CM | POA: Diagnosis not present

## 2020-12-21 DIAGNOSIS — I129 Hypertensive chronic kidney disease with stage 1 through stage 4 chronic kidney disease, or unspecified chronic kidney disease: Secondary | ICD-10-CM | POA: Diagnosis not present

## 2020-12-21 DIAGNOSIS — Z79899 Other long term (current) drug therapy: Secondary | ICD-10-CM | POA: Diagnosis not present

## 2020-12-21 DIAGNOSIS — I10 Essential (primary) hypertension: Secondary | ICD-10-CM | POA: Diagnosis not present

## 2020-12-21 DIAGNOSIS — E559 Vitamin D deficiency, unspecified: Secondary | ICD-10-CM | POA: Diagnosis not present

## 2020-12-26 DIAGNOSIS — H524 Presbyopia: Secondary | ICD-10-CM | POA: Diagnosis not present

## 2020-12-26 DIAGNOSIS — H5211 Myopia, right eye: Secondary | ICD-10-CM | POA: Diagnosis not present

## 2020-12-26 DIAGNOSIS — E119 Type 2 diabetes mellitus without complications: Secondary | ICD-10-CM | POA: Diagnosis not present

## 2020-12-26 DIAGNOSIS — Z961 Presence of intraocular lens: Secondary | ICD-10-CM | POA: Diagnosis not present

## 2020-12-26 DIAGNOSIS — H52201 Unspecified astigmatism, right eye: Secondary | ICD-10-CM | POA: Diagnosis not present

## 2021-02-02 ENCOUNTER — Encounter: Payer: Self-pay | Admitting: Neurology

## 2021-04-27 ENCOUNTER — Ambulatory Visit (INDEPENDENT_AMBULATORY_CARE_PROVIDER_SITE_OTHER): Payer: Medicare Other | Admitting: Neurology

## 2021-04-27 ENCOUNTER — Encounter: Payer: Self-pay | Admitting: Neurology

## 2021-04-27 ENCOUNTER — Other Ambulatory Visit: Payer: Self-pay

## 2021-04-27 VITALS — BP 149/84 | HR 83 | Resp 18 | Ht 65.0 in | Wt 165.0 lb

## 2021-04-27 DIAGNOSIS — G5603 Carpal tunnel syndrome, bilateral upper limbs: Secondary | ICD-10-CM | POA: Diagnosis not present

## 2021-04-27 MED ORDER — GABAPENTIN 100 MG PO CAPS: ORAL_CAPSULE | ORAL | 1 refills | Status: DC

## 2021-04-27 NOTE — Patient Instructions (Signed)
Nerve testing of the arms.  Do not apply lotion or oil to your skin on the day of testing  Referral [facility] for bilateral wrist braces  Start gabapentin 100mg  at bedtime x 1 week, then increase to 100mg  twice daily    ELECTROMYOGRAM AND NERVE CONDUCTION STUDIES (EMG/NCS) INSTRUCTIONS  How to Prepare The neurologist conducting the EMG will need to know if you have certain medical conditions. Tell the neurologist and other EMG lab personnel if you: Have a pacemaker or any other electrical medical device Take blood-thinning medications Have hemophilia, a blood-clotting disorder that causes prolonged bleeding Bathing Take a shower or bath shortly before your exam in order to remove oils from your skin. Don't apply lotions or creams before the exam.  What to Expect You'll likely be asked to change into a hospital gown for the procedure and lie down on an examination table. The following explanations can help you understand what will happen during the exam.  Electrodes. The neurologist or a technician places surface electrodes at various locations on your skin depending on where you're experiencing symptoms. Or the neurologist may insert needle electrodes at different sites depending on your symptoms.  Sensations. The electrodes will at times transmit a tiny electrical current that you may feel as a twinge or spasm. The needle electrode may cause discomfort or pain that usually ends shortly after the needle is removed. If you are concerned about discomfort or pain, you may want to talk to the neurologist about taking a short break during the exam.  Instructions. During the needle EMG, the neurologist will assess whether there is any spontaneous electrical activity when the muscle is at rest - activity that isn't present in healthy muscle tissue - and the degree of activity when you slightly contract the muscle.  He or she will give you instructions on resting and contracting a muscle at appropriate  times. Depending on what muscles and nerves the neurologist is examining, he or she may ask you to change positions during the exam.  After your EMG You may experience some temporary, minor bruising where the needle electrode was inserted into your muscle. This bruising should fade within several days. If it persists, contact your primary care doctor.

## 2021-04-27 NOTE — Progress Notes (Signed)
Julie [name] - Initial Visit   Date: 04/27/21  Julie [name] MRN: [id number] DOB: Apr 15, 1935   Dear Dr. Thomasena Edis:  Thank you for your kind referral of Julie [name] for consultation of hand numbness. Although her history is well known to you, please allow [place] to reiterate it for the purpose of our medical record. The patient was accompanied to the clinic by self.    History of Present Illness: Julie [name] is a 86 y.o. right-handed female with well-controlled diabetes mellitus type 2, hypertension, hypothyroidism, and asthma presenting for evaluation of bilateral hand numbness.   For at least the past 9 years, she has been experiencing numbness and tingling in the hands, worse on the left.  Symptoms were intermittent at first and gradually become constant.  Over the past 3-4 months, the numbness in her hands has become constant and at night time she wakes up with her has falling asleep.  She difficulty with holding objects and frequently will drop items.  She also has stiffness in the hands, sometimes making it difficult to open her hands or make a fist completely. She has some numbness in the left foot, denies any numbness in the right foot.  She walks with a walker.  She lives in independent living.  She does not drive.  She manages her own finances and medications. 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 She does not have children.  Out-side paper records, electronic medical record, and images have been reviewed where available and summarized as:  Labs 12/21/2020:  HbA1c 5.9, TSH 0.77   Past Medical History:  Diagnosis Date   Asthma    Hypertension     Past Surgical History:  Procedure Laterality Date   foot surgery     ovarina cyst removal     ovarian cyst removal     Medications:  Outpatient Encounter Medications as of 04/27/2021  Medication Sig    amLODipine (NORVASC) 10 MG tablet Take 10 mg by mouth daily.   aspirin 81 MG tablet Take 81 mg by mouth daily.   carvedilol (COREG) 3.125 MG tablet Take 3.125 mg by mouth 2 (two) times daily.   indomethacin (INDOCIN) 50 MG capsule Take 50 mg by mouth daily as needed for mild pain. gout    ketorolac (ACULAR) 0.4 % SOLN Place 1 drop into the left eye daily.   l-methylfolate-B6-B12 (METANX) 3-35-2 MG TABS tablet Take 1 tablet by mouth daily.   levothyroxine (SYNTHROID, LEVOTHROID) 50 MCG tablet Take 50 mcg by mouth once a week.    LORazepam (ATIVAN) 1 MG tablet Take 1 mg by mouth 3 (three) times daily as needed (Dizziness).   meclizine (ANTIVERT) 25 MG tablet Take 25 mg by mouth every 6 (six) hours as needed. vertigo   meclizine (ANTIVERT) 25 MG tablet Take 1 tablet (25 mg total) by mouth 3 (three) times daily as needed for dizziness.   meloxicam (MOBIC) 15 MG tablet Take 15 mg by mouth daily.   metFORMIN (GLUCOPHAGE) 500 MG tablet Take 500 mg by mouth daily.   Omega-3 Fatty Acids (FISH OIL BURP-LESS) 1000 MG CAPS Take 1,000 mg by mouth daily.   traMADol (ULTRAM) 50 MG tablet Take 50 mg by mouth 2 (two) times daily as needed for moderate pain. Maximum dose= 8 tablets per day pain    TURMERIC PO turmeric   No facility-administered encounter medications on file  as of 04/27/2021.    Allergies:  Allergies  Allergen Reactions   Aspirin     Bleeding    Nsaids     Bleeding    Family History: History reviewed. No pertinent family history.  Social History: Social History   Tobacco Use   Smoking status: Former   Smokeless tobacco: Never  Substance Use Topics   Alcohol use: No   Drug use: No   Social History   Social History Narrative   Not on file    Vital Signs:  BP (!) 149/84   Pulse 83   Resp 18   Ht 5\' 5"  (1.651 m)   Wt 165 lb (74.8 kg)   SpO2 98%   BMI 27.46 kg/m   Neurological Exam: MENTAL STATUS including orientation to time, place, person, recent and remote memory,  attention span and concentration, language, and fund of knowledge is normal.  Speech is not dysarthric.  CRANIAL NERVES: II:  No visual field defects.  III-IV-VI: Pupils equal round and reactive to light.  Normal conjugate, extra-ocular eye movements in all directions of gaze.  No nystagmus.  No ptosis.   V:  Normal facial sensation.    VII:  Normal facial symmetry and movements.   VIII:  Normal hearing and vestibular function.   IX-X:  Normal palatal movement.   XI:  Normal shoulder shrug and head rotation.   XII:  Normal tongue strength and range of motion, no deviation or fasciculation.  MOTOR:  Severe ABP atrophy on the left >> right.  No fasciculations or abnormal movements.  No pronator drift.   Upper Extremity:  Right  Left  Deltoid  5/5   5/5   Biceps  5/5   5/5   Triceps  5/5   5/5   Infraspinatus 5/5  5/5  Medial pectoralis 5/5  5/5  Wrist extensors  5/5   5/5   Wrist flexors  5/5   5/5   Finger extensors  5/5   5/5   Finger flexors  5/5   5/5   Dorsal interossei  5/5   5/5   Abductor pollicis  4/5   2/5   Tone (Ashworth scale)  0  0   Lower Extremity:  Right  Left  Hip flexors  5/5   5/5   Hip extensors  5/5   5/5   Adductor 5/5  5/5  Abductor 5/5  5/5  Knee flexors  5/5   5/5   Knee extensors  5/5   5/5   Dorsiflexors  5/5   5/5   Plantarflexors  5/5   5/5   Toe extensors  5/5   5/5   Toe flexors  5/5   5/5   Tone (Ashworth scale)  0  0   MSRs:  Right        Left                  brachioradialis 2+  2+  biceps 2+  2+  triceps 2+  2+  patellar 2+  2+  ankle jerk 0  0  Hoffman no  no  plantar response down  down   SENSORY:  Reduced sensation to all modalities over the median distribution bilaterally, worse on the left.  Vibration is mildly reduced at the ankles.  COORDINATION/GAIT: Normal finger-to- nose-finger.  Intact rapid alternating movements bilaterally. Gait is wide-based, assisted with a walker  IMPRESSION: Bilateral hand weakness and  paresthesias, with severe left ABP atrophy, concerning for  carpal tunnel syndrome. Less likely neuropathy given that she is relatively asymptomatic in the feet and diabetes is very well controlled.  - NCS/EMG of the arms to help localize symptoms  - Referral to [facility] for bilateral wrist braces  - Start gabapentin 100mg  at bedtime x 1 week, then increase to 100mg  twice daily  Further recommendations pending results.    Thank you for allowing me to participate in patient's care.  If I can answer any additional questions, I would be pleased to do so.    Sincerely,    Chaney Maclaren K. , DO

## 2021-04-28 DIAGNOSIS — G5603 Carpal tunnel syndrome, bilateral upper limbs: Secondary | ICD-10-CM | POA: Diagnosis not present

## 2021-05-31 ENCOUNTER — Other Ambulatory Visit: Payer: Self-pay

## 2021-05-31 ENCOUNTER — Ambulatory Visit (INDEPENDENT_AMBULATORY_CARE_PROVIDER_SITE_OTHER): Payer: Medicare Other | Admitting: Neurology

## 2021-05-31 DIAGNOSIS — G5603 Carpal tunnel syndrome, bilateral upper limbs: Secondary | ICD-10-CM | POA: Diagnosis not present

## 2021-05-31 NOTE — Procedures (Signed)
[facility]  [address] [place], [address]  [place], [place] [address] Tel: [phone] Fax:  [phone] Test Date:  05/31/2021  Patient: Julie [name] DOB: 01/09/1935 Physician: Nita Sickle, DO  Sex: Female Height: 5\' 5"  Ref Phys: , DO  ID#: Nita Sickle   Technician:    Patient Complaints: This is a 86 year old female referred for evaluation of bilateral hand weakness and numbness.  NCV & EMG Findings: Extensive electrodiagnostic testing of the right upper extremity and additional studies of the left shows:  Bilateral median sensory responses are absent.  Bilateral ulnar sensory responses are within normal limits. Bilateral median motor responses are absent.  Bilateral ulnar motor responses are within normal limits. Despite maximal activation, there is no motor unit recruitment seen in the abductor pollicis brevis muscles bilaterally, where there is marked atrophy.  Impression: Bilateral median neuropathy at or distal to the wrist, consistent with a clinical diagnosis of carpal tunnel syndrome.  Overall, these findings are very severe in degree electrically.   ___________________________ 97, DO    Nerve Conduction Studies Anti Sensory Summary Table   Stim Site NR Peak (ms) Norm Peak (ms) P-T Amp (V) Norm P-T Amp  Left Median Anti Sensory (2nd Digit)  34C  Wrist NR  <3.8  >10  Right Median Anti Sensory (2nd Digit)  34C  Wrist NR  <3.8  >10  Left Ulnar Anti Sensory (5th Digit)  34C  Wrist    3.1 <3.2 14.3 >5  Right Ulnar Anti Sensory (5th Digit)  34C  Wrist    2.7 <3.2 17.5 >5   Motor Summary Table   Stim Site NR Onset (ms) Norm Onset (ms) O-P Amp (mV) Norm O-P Amp Site1 Site2 Delta-0 (ms) Dist (cm) Vel (m/s) Norm Vel (m/s)  Left Median Motor (Abd Poll Brev)  34C  Wrist NR  <4.0  >5 Elbow Wrist  0.0  >50  Elbow NR            Right Median Motor (Abd Poll Brev)  34C  Wrist NR  <4.0  >5 Elbow Wrist  0.0  >50  Elbow NR            Left  Ulnar Motor (Abd Dig Minimi)  34C  Wrist    2.2 <3.1 8.0 >7 B Elbow Wrist 3.7 20.0 54 >50  B Elbow    5.9  6.8  A Elbow B Elbow 1.7 10.0 59 >50  A Elbow    7.6  6.4         Right Ulnar Motor (Abd Dig Minimi)  34C  Wrist    2.5 <3.1 9.4 >7 B Elbow Wrist 3.8 22.0 58 >50  B Elbow    6.3  8.4  A Elbow B Elbow 1.6 10.0 62 >50  A Elbow    7.9  7.6          EMG   Side Muscle Ins Act Fibs Psw Fasc Number Recrt Dur Dur. Amp Amp. Poly Poly. Comment  Right 1stDorInt Nml Nml Nml Nml Nml Nml Nml Nml Nml Nml Nml Nml N/A  Right Abd Poll Brev Nml Nml Nml Nml NE None - - - - - - ATR  Right PronatorTeres Nml Nml Nml Nml Nml Nml Nml Nml Nml Nml Nml Nml N/A  Right Biceps Nml Nml Nml Nml Nml Nml Nml Nml Nml Nml Nml Nml N/A  Right Triceps Nml Nml Nml Nml Nml Nml Nml Nml Nml Nml Nml Nml N/A  Right Deltoid  Nml Nml Nml Nml Nml Nml Nml Nml Nml Nml Nml Nml N/A  Left 1stDorInt Nml Nml Nml Nml Nml Nml Nml Nml Nml Nml Nml Nml N/A  Left Abd Poll Brev Nml Nml Nml Nml NE None - - - - - - ATR  Left PronatorTeres Nml Nml Nml Nml Nml Nml Nml Nml Nml Nml Nml Nml N/A      Waveforms:

## 2021-06-23 DIAGNOSIS — N1831 Chronic kidney disease, stage 3a: Secondary | ICD-10-CM | POA: Diagnosis not present

## 2021-06-23 DIAGNOSIS — E039 Hypothyroidism, unspecified: Secondary | ICD-10-CM | POA: Diagnosis not present

## 2021-06-23 DIAGNOSIS — I129 Hypertensive chronic kidney disease with stage 1 through stage 4 chronic kidney disease, or unspecified chronic kidney disease: Secondary | ICD-10-CM | POA: Diagnosis not present

## 2021-06-23 DIAGNOSIS — Z23 Encounter for immunization: Secondary | ICD-10-CM | POA: Diagnosis not present

## 2021-06-23 DIAGNOSIS — E119 Type 2 diabetes mellitus without complications: Secondary | ICD-10-CM | POA: Diagnosis not present

## 2021-06-23 DIAGNOSIS — D649 Anemia, unspecified: Secondary | ICD-10-CM | POA: Diagnosis not present

## 2021-06-23 DIAGNOSIS — G5603 Carpal tunnel syndrome, bilateral upper limbs: Secondary | ICD-10-CM | POA: Diagnosis not present

## 2021-06-23 DIAGNOSIS — Z0000 Encounter for general adult medical examination without abnormal findings: Secondary | ICD-10-CM | POA: Diagnosis not present

## 2021-06-23 DIAGNOSIS — M109 Gout, unspecified: Secondary | ICD-10-CM | POA: Diagnosis not present

## 2021-08-07 DIAGNOSIS — E119 Type 2 diabetes mellitus without complications: Secondary | ICD-10-CM | POA: Diagnosis not present

## 2021-08-07 DIAGNOSIS — E7800 Pure hypercholesterolemia, unspecified: Secondary | ICD-10-CM | POA: Diagnosis not present

## 2021-08-07 DIAGNOSIS — E039 Hypothyroidism, unspecified: Secondary | ICD-10-CM | POA: Diagnosis not present

## 2021-12-15 ENCOUNTER — Observation Stay (HOSPITAL_COMMUNITY)
Admission: EM | Admit: 2021-12-15 | Discharge: 2021-12-17 | Disposition: A | Discharge status: Home / Self Care | Payer: Medicare Other | Attending: Internal Medicine | Admitting: Internal Medicine

## 2021-12-15 ENCOUNTER — Encounter (HOSPITAL_COMMUNITY): Payer: Self-pay | Admitting: Oncology

## 2021-12-15 ENCOUNTER — Other Ambulatory Visit: Payer: Self-pay

## 2021-12-15 DIAGNOSIS — Z20822 Contact with and (suspected) exposure to covid-19: Secondary | ICD-10-CM | POA: Insufficient documentation

## 2021-12-15 DIAGNOSIS — Z87891 Personal history of nicotine dependence: Secondary | ICD-10-CM | POA: Insufficient documentation

## 2021-12-15 DIAGNOSIS — K5730 Diverticulosis of large intestine without perforation or abscess without bleeding: Secondary | ICD-10-CM | POA: Insufficient documentation

## 2021-12-15 DIAGNOSIS — Z79899 Other long term (current) drug therapy: Secondary | ICD-10-CM | POA: Diagnosis not present

## 2021-12-15 DIAGNOSIS — Z743 Need for continuous supervision: Secondary | ICD-10-CM | POA: Diagnosis not present

## 2021-12-15 DIAGNOSIS — D649 Anemia, unspecified: Secondary | ICD-10-CM | POA: Diagnosis not present

## 2021-12-15 DIAGNOSIS — Z7984 Long term (current) use of oral hypoglycemic drugs: Secondary | ICD-10-CM | POA: Diagnosis not present

## 2021-12-15 DIAGNOSIS — J45909 Unspecified asthma, uncomplicated: Secondary | ICD-10-CM | POA: Insufficient documentation

## 2021-12-15 DIAGNOSIS — K648 Other hemorrhoids: Secondary | ICD-10-CM | POA: Insufficient documentation

## 2021-12-15 DIAGNOSIS — E039 Hypothyroidism, unspecified: Secondary | ICD-10-CM | POA: Diagnosis not present

## 2021-12-15 DIAGNOSIS — M1990 Unspecified osteoarthritis, unspecified site: Secondary | ICD-10-CM | POA: Diagnosis not present

## 2021-12-15 DIAGNOSIS — M109 Gout, unspecified: Secondary | ICD-10-CM | POA: Diagnosis not present

## 2021-12-15 DIAGNOSIS — R7889 Finding of other specified substances, not normally found in blood: Secondary | ICD-10-CM | POA: Diagnosis not present

## 2021-12-15 DIAGNOSIS — E1122 Type 2 diabetes mellitus with diabetic chronic kidney disease: Secondary | ICD-10-CM | POA: Insufficient documentation

## 2021-12-15 DIAGNOSIS — R195 Other fecal abnormalities: Secondary | ICD-10-CM | POA: Insufficient documentation

## 2021-12-15 DIAGNOSIS — I10 Essential (primary) hypertension: Secondary | ICD-10-CM

## 2021-12-15 DIAGNOSIS — E119 Type 2 diabetes mellitus without complications: Secondary | ICD-10-CM | POA: Diagnosis not present

## 2021-12-15 DIAGNOSIS — I129 Hypertensive chronic kidney disease with stage 1 through stage 4 chronic kidney disease, or unspecified chronic kidney disease: Secondary | ICD-10-CM | POA: Insufficient documentation

## 2021-12-15 DIAGNOSIS — Z794 Long term (current) use of insulin: Secondary | ICD-10-CM

## 2021-12-15 DIAGNOSIS — N1831 Chronic kidney disease, stage 3a: Secondary | ICD-10-CM | POA: Diagnosis not present

## 2021-12-15 DIAGNOSIS — M1711 Unilateral primary osteoarthritis, right knee: Secondary | ICD-10-CM | POA: Diagnosis not present

## 2021-12-15 DIAGNOSIS — K259 Gastric ulcer, unspecified as acute or chronic, without hemorrhage or perforation: Secondary | ICD-10-CM | POA: Diagnosis not present

## 2021-12-15 DIAGNOSIS — N1832 Chronic kidney disease, stage 3b: Secondary | ICD-10-CM

## 2021-12-15 DIAGNOSIS — M5136 Other intervertebral disc degeneration, lumbar region: Secondary | ICD-10-CM | POA: Diagnosis not present

## 2021-12-15 LAB — IRON AND TIBC
Iron: 11 ug/dL — ABNORMAL LOW (ref 28–170)
Saturation Ratios: 3 % — ABNORMAL LOW (ref 10.4–31.8)
TIBC: 350 ug/dL (ref 250–450)
UIBC: 339 ug/dL

## 2021-12-15 LAB — CBC WITH DIFFERENTIAL/PLATELET
Abs Immature Granulocytes: 0.02 10*3/uL (ref 0.00–0.07)
Basophils Absolute: 0.1 10*3/uL (ref 0.0–0.1)
Basophils Relative: 1 %
Eosinophils Absolute: 0 10*3/uL (ref 0.0–0.5)
Eosinophils Relative: 1 %
HCT: 22.8 % — ABNORMAL LOW (ref 36.0–46.0)
Hemoglobin: 6.9 g/dL — CL (ref 12.0–15.0)
Immature Granulocytes: 0 %
Lymphocytes Relative: 16 %
Lymphs Abs: 1.1 10*3/uL (ref 0.7–4.0)
MCH: 22.7 pg — ABNORMAL LOW (ref 26.0–34.0)
MCHC: 30.3 g/dL (ref 30.0–36.0)
MCV: 75 fL — ABNORMAL LOW (ref 80.0–100.0)
Monocytes Absolute: 0.4 10*3/uL (ref 0.1–1.0)
Monocytes Relative: 6 %
Neutro Abs: 4.9 10*3/uL (ref 1.7–7.7)
Neutrophils Relative %: 76 %
Platelets: 306 10*3/uL (ref 150–400)
RBC: 3.04 MIL/uL — ABNORMAL LOW (ref 3.87–5.11)
RDW: 19.9 % — ABNORMAL HIGH (ref 11.5–15.5)
WBC: 6.5 10*3/uL (ref 4.0–10.5)
nRBC: 0 % (ref 0.0–0.2)

## 2021-12-15 LAB — COMPREHENSIVE METABOLIC PANEL
ALT: 13 U/L (ref 0–44)
AST: 25 U/L (ref 15–41)
Albumin: 3.7 g/dL (ref 3.5–5.0)
Alkaline Phosphatase: 85 U/L (ref 38–126)
Anion gap: 6 (ref 5–15)
BUN: 26 mg/dL — ABNORMAL HIGH (ref 8–23)
CO2: 23 mmol/L (ref 22–32)
Calcium: 9 mg/dL (ref 8.9–10.3)
Chloride: 110 mmol/L (ref 98–111)
Creatinine, Ser: 1.29 mg/dL — ABNORMAL HIGH (ref 0.44–1.00)
GFR, Estimated: 41 mL/min — ABNORMAL LOW (ref >60)
Glucose, Bld: 109 mg/dL — ABNORMAL HIGH (ref 70–99)
Potassium: 4.8 mmol/L (ref 3.5–5.1)
Sodium: 139 mmol/L (ref 135–145)
Total Bilirubin: 0.3 mg/dL (ref 0.3–1.2)
Total Protein: 7.2 g/dL (ref 6.5–8.1)

## 2021-12-15 LAB — FERRITIN: Ferritin: 5 ng/mL — ABNORMAL LOW (ref 11–307)

## 2021-12-15 LAB — RETICULOCYTES
Immature Retic Fract: 22 % — ABNORMAL HIGH (ref 2.3–15.9)
RBC.: 2.99 MIL/uL — ABNORMAL LOW (ref 3.87–5.11)
Retic Count, Absolute: 62.2 10*3/uL (ref 19.0–186.0)
Retic Ct Pct: 2.1 % (ref 0.4–3.1)

## 2021-12-15 LAB — VITAMIN B12: Vitamin B-12: 5554 pg/mL — ABNORMAL HIGH (ref 180–914)

## 2021-12-15 LAB — RESP PANEL BY RT-PCR (FLU A&B, COVID) ARPGX2
Influenza A by PCR: NEGATIVE
Influenza B by PCR: NEGATIVE
SARS Coronavirus 2 by RT PCR: NEGATIVE

## 2021-12-15 LAB — PREPARE RBC (CROSSMATCH)

## 2021-12-15 LAB — FOLATE: Folate: 87.8 ng/mL (ref >5.9)

## 2021-12-15 MED ORDER — SODIUM CHLORIDE 0.9% IV SOLUTION
Freq: Once | INTRAVENOUS | Status: DC
Start: 2021-12-15 — End: 2021-12-18

## 2021-12-15 MED ORDER — TRAMADOL HCL 50 MG PO TABS: 100.0000 mg | ORAL_TABLET | Freq: Two times a day (BID) | ORAL | Status: DC | PRN

## 2021-12-15 MED ORDER — ACETAMINOPHEN 650 MG RE SUPP: 650.0000 mg | Freq: Four times a day (QID) | RECTAL | Status: DC | PRN

## 2021-12-15 MED ORDER — ONDANSETRON HCL 4 MG/2ML IJ SOLN: 4.0000 mg | Freq: Four times a day (QID) | INTRAMUSCULAR | Status: DC | PRN

## 2021-12-15 MED ORDER — HYDRALAZINE HCL 20 MG/ML IJ SOLN
5.0000 mg | Freq: Four times a day (QID) | INTRAMUSCULAR | Status: DC | PRN
  Administered 2021-12-17: 5 mg via INTRAVENOUS
  Filled 2021-12-15: qty 1

## 2021-12-15 MED ORDER — LORAZEPAM 1 MG PO TABS: 1.0000 mg | ORAL_TABLET | Freq: Three times a day (TID) | ORAL | Status: DC | PRN

## 2021-12-15 MED ORDER — AMLODIPINE BESYLATE 10 MG PO TABS
10.0000 mg | ORAL_TABLET | Freq: Every day | ORAL | Status: DC
  Administered 2021-12-16: 09:00:00 10 mg via ORAL
  Filled 2021-12-15: qty 1

## 2021-12-15 MED ORDER — LEVOTHYROXINE SODIUM 50 MCG PO TABS
50.0000 ug | ORAL_TABLET | ORAL | Status: DC
  Filled 2021-12-15: qty 1

## 2021-12-15 MED ORDER — SODIUM CHLORIDE 0.9 % IV SOLN
10.0000 mL/h | Freq: Once | INTRAVENOUS | Status: AC
  Administered 2021-12-15: 10 mL/h via INTRAVENOUS

## 2021-12-15 MED ORDER — CARVEDILOL 3.125 MG PO TABS
3.1250 mg | ORAL_TABLET | Freq: Two times a day (BID) | ORAL | Status: DC
  Administered 2021-12-15 – 2021-12-16 (×3): 3.125 mg via ORAL
  Filled 2021-12-15 (×3): qty 1

## 2021-12-15 MED ORDER — SENNOSIDES-DOCUSATE SODIUM 8.6-50 MG PO TABS: 1.0000 | ORAL_TABLET | Freq: Every evening | ORAL | Status: DC | PRN

## 2021-12-15 MED ORDER — ACETAMINOPHEN 325 MG PO TABS: 650.0000 mg | ORAL_TABLET | Freq: Four times a day (QID) | ORAL | Status: DC | PRN

## 2021-12-15 MED ORDER — ONDANSETRON HCL 4 MG PO TABS: 4.0000 mg | ORAL_TABLET | Freq: Four times a day (QID) | ORAL | Status: DC | PRN

## 2021-12-15 MED ORDER — PANTOPRAZOLE SODIUM 40 MG IV SOLR
40.0000 mg | Freq: Two times a day (BID) | INTRAVENOUS | Status: DC
  Administered 2021-12-15 – 2021-12-16 (×3): 40 mg via INTRAVENOUS
  Filled 2021-12-15 (×4): qty 10

## 2021-12-15 NOTE — Assessment & Plan Note (Signed)
Hold Metformin.  ?Monitor.  ?

## 2021-12-15 NOTE — H&P (Signed)
?History and Physical  ? ? ?Patient: Julie [name] [id number] DOB: 07-02-1935 ?DOA: 12/15/2021 ?DOS: the patient was seen and examined on 12/15/2021 ?PCP: Janie Morning, DO  ?Patient coming from: Home ? ?Chief Complaint:  ?Chief Complaint  ?Patient presents with  ? Abnormal Lab  ? ?HPI: Julie [name] is a 87 y.o. female with medical history significant of hypertension, diabetes type 2, hypothyroidism, she was referred by her PCP to the ED due to abnormal lab work, hemoglobin was low 6.9. ?Patient presented with abnormal lab work.  She reported 1 episode of vomiting at home, after she returned from her PCP.  She think it was probably because she did not eat anything during the whole day.  She denies abdominal pain.  She had some left lower quadrant abdominal pain weeks ago but it has resolved.  She also reports 4 to 5 weeks ago some episode of black stool, she thought it was related to some herbs that she was taking.  No further black stool.  She takes indomethacin for a few days for arthritis. ?She denies chest pain, dyspnea. ? ?Evaluation in the ED: Sodium 139, potassium 4.8, BUN 26, creatinine 1.2, normal liver function test, normal bilirubin, white blood cell normal, platelets normal at 306, hemoglobin 6.9. ? ? ?  ?Review of Systems: As mentioned in the history of present illness. All other systems reviewed and are negative. ?Past Medical History:  ?Diagnosis Date  ? Asthma   ? Hypertension   ? ?Past Surgical History:  ?Procedure Laterality Date  ? foot surgery    ? ovarina cyst removal    ? ovarian cyst removal  ? ?Social History:  reports that she has quit smoking. She has never used smokeless tobacco. She reports that she does not drink alcohol and does not use drugs. ? ?Allergies  ?Allergen Reactions  ? Ace Inhibitors   ?  Other reaction(s): angioedemia  ? Amrix [Cyclobenzaprine Hcl]   ?  Other reaction(s): abnormal LFT  ? Aspirin Other (See Comments)  ?  Bleeding   ? Fluticasone-Salmeterol   ?  Other  reaction(s): blurred vision  ? Nifedipine   ?  Other reaction(s): nervousness  ? Nsaids   ?  Bleeding  ? Pregabalin   ?  Other reaction(s): Unknown  ? Zocor [Simvastatin]   ?  Other reaction(s): myalgia  ? ? ?No family history on file. ? ?Prior to Admission medications   ?Medication Sig Start Date End Date Taking? Authorizing Provider  ?carvedilol (COREG) 3.125 MG tablet Take 3.125 mg by mouth 2 (two) times daily. 03/15/21  Yes [provider]  ?levothyroxine (SYNTHROID, LEVOTHROID) 50 MCG tablet Take 50 mcg by mouth every Sunday.   Yes [provider]  ?traMADol (ULTRAM) 50 MG tablet Take 100 mg by mouth 2 (two) times daily as needed for moderate pain. Maximum dose= 8 tablets per day pain   Yes [provider]  ?amLODipine (NORVASC) 10 MG tablet Take 10 mg by mouth daily.    [provider]  ?aspirin 81 MG tablet Take 81 mg by mouth daily.    [provider]  ?gabapentin (NEURONTIN) 100 MG capsule Take 1 tablet at bedtime x 1 week, then increase to 1 tablet twice daily. 04/27/21   Narda Amber K, DO  ?indomethacin (INDOCIN) 50 MG capsule Take 50 mg by mouth daily as needed for mild pain. gout     [provider]  ?ketorolac (ACULAR) 0.4 % SOLN Place 1 drop into the left  eye daily. 07/23/16   [provider]  ?l-methylfolate-B6-B12 (METANX) 3-35-2 MG TABS tablet Take 1 tablet by mouth daily.    [provider]  ?LORazepam (ATIVAN) 1 MG tablet Take 1 mg by mouth 3 (three) times daily as needed (Dizziness).    [provider]  ?meclizine (ANTIVERT) 25 MG tablet Take 25 mg by mouth every 6 (six) hours as needed. vertigo    [provider]  ?meclizine (ANTIVERT) 25 MG tablet Take 1 tablet (25 mg total) by mouth 3 (three) times daily as needed for dizziness. 08/13/16   Kirichenko, Lahoma Rocker, PA-C  ?meloxicam (MOBIC) 15 MG tablet Take 15 mg by mouth daily. 06/13/16   [provider]  ?metFORMIN (GLUCOPHAGE) 500 MG tablet Take 500  mg by mouth daily.    [provider]  ?Omega-3 Fatty Acids (FISH OIL BURP-LESS) 1000 MG CAPS Take 1,000 mg by mouth daily.    [provider]  ?predniSONE (DELTASONE) 5 MG tablet Take 5 mg by mouth.    [provider]  ?TURMERIC PO turmeric ?Patient not taking: Reported on 04/27/2021    [provider]  ? ? ?Physical Exam: ?Vitals:  ? 12/15/21 1530 12/15/21 1545 12/15/21 1615 12/15/21 1630  ?BP:   (!) 118/53 138/79  ?Pulse: 70 70 78 92  ?Resp: 15 14  14   ?Temp:      ?SpO2: 95% 98% 99% 97%  ?Weight:      ?Height:      ? ?General; no acute distress very pleasant ?CVS; S 1, S 2 RRR ?Lung; Bilateral air movement, no wheezing, no crackles.  ?Abdomen; Soft, NT, ND. ?Extremities; No edema.  ?Neuro; alert and oriented, moves all 4 extremities. Follows command.  ? ? ?Data Reviewed: ? ?Cbc and Bmet reviewed.  ? ?Assessment and Plan: ?* Severe anemia ?-Presents with hemoglobin of 6.9. ?-Prior hemoglobin per records 12, 5 years ago.  Per ED physician patient had hemoglobin previously at 10- one year ago. ?-Normal bilirubin, unlikely hemolysis. Normal platelet, and white count.  ?-Report Black stool weeks ago.  ?-Started IV Protonix IV BID>  ?-Plan to transfuse total 2 units PRBC.  ?-GI consulted.  ? ?Type 2 diabetes mellitus without complication, with long-term current use of insulin ([place]) ?Hold Metformin.  ?Monitor.  ? ?Hypothyroidism ?Continue with Synthroid ? ?Essential hypertension ?Continue with Norvasc and carvedilol ? ?Normocytic anemia-resolved as of 12/15/2021 ? ? ? ? ? ? ? ? Advance Care Planning:   Code Status: Not on file discussed with patient she wishes to be full code.  ? ?Consults: GI will be consulted by ED physician.  ? ?Family Communication: Care discussed with patient. Contact didn't answer phone  ?Severity of Illness: ?The appropriate patient status for this patient is OBSERVATION. Observation status is judged to be reasonable and necessary in order to provide the required  intensity of service to ensure the patient's safety. The patient's presenting symptoms, physical exam findings, and initial radiographic and laboratory data in the context of their medical condition is felt to place them at decreased risk for further clinical deterioration. Furthermore, it is anticipated that the patient will be medically stable for discharge from the hospital within 2 midnights of admission.  ? ?Author: ?Elmarie Shiley, MD ?12/15/2021 5:20 PM ? ?For on call review [email or web address].  ?

## 2021-12-15 NOTE — ED Provider Notes (Signed)
Care transferred to me.  Patient is hemodynamically stable.  Her labs were reviewed/interpreted and she has anemia with a hemoglobin of 6.9.  BUN is mildly elevated but creatinine is normal.  No current abdominal pain.  She reports no change in stools recently and had declined rectal exam.  At this point, I think she will need admission for evaluation of her anemia as well as treatment with a unit of blood.  I have consented her for this and we will transfuse her 1 unit and send an anemia panel.  She will need admission. No NSAIDs or significant aspirin use. No ETOH use.  Discussed with Dr. Tyrell Antonio who will admit.  She is asking for a GI consult.  I discussed with [facility], Tye Savoy.  ? ?CRITICAL CARE ?Performed by: Ephraim Hamburger ? ? ?Total critical care time: 30 minutes ? ?Critical care time was exclusive of separately billable procedures and treating other patients. ? ?Critical care was necessary to treat or prevent imminent or life-threatening deterioration. ? ?Critical care was time spent personally by me on the following activities: development of treatment plan with patient and/or surrogate as well as nursing, discussions with consultants, evaluation of patient's response to treatment, examination of patient, obtaining history from patient or surrogate, ordering and performing treatments and interventions, ordering and review of laboratory studies, ordering and review of radiographic studies, pulse oximetry and re-evaluation of patient's condition.  ?  ?Sherwood Gambler, MD ?12/15/21 1644 ? ?

## 2021-12-15 NOTE — Assessment & Plan Note (Signed)
Continue with Norvasc and carvedilol ?

## 2021-12-15 NOTE — ED Triage Notes (Signed)
Pt presents d/t low Hgb.  Pt has no symptoms associated.  Pt received call from PCP today informing her that her Hgb was 7 and to go be seen.  ?

## 2021-12-15 NOTE — Assessment & Plan Note (Signed)
Continue with Synthroid 

## 2021-12-15 NOTE — ED Notes (Signed)
MD and primary RN aware of critical hemoglobin 6.9. ?

## 2021-12-15 NOTE — Assessment & Plan Note (Addendum)
-  Presents with hemoglobin of 6.9. Iron deficiency.  ?-Prior hemoglobin per records 12, 5 years ago.  Per ED physician patient had hemoglobin previously at 10- one year ago. ?-Normal bilirubin, unlikely hemolysis. Normal platelet, and white count.  ?-Report Black stool weeks ago.  ?-Treated with  IV Protonix IV BID>  ?-received a total of  2 units PRBC.  ?-GI consulted. Underwent endoscopy/colonoscopy: gastric ulcer, diverticulosis, external /internal hemorrhoids.  ?-Hb stable. Received IV iron 3/12. ?-Discharge on PPI and iron supplement.  ?

## 2021-12-15 NOTE — ED Provider Notes (Incomplete)
[facility]-EMERGENCY DEPT Provider Note   CSN: [id number] Arrival date & time: 12/15/21  1403     History {Add pertinent medical, surgical, social history, OB history to HPI:1} Chief Complaint  Patient presents with   Abnormal Lab    Julie [name] is a 87 y.o. female.  87 yo F with a chief complaints of acute anemia.  This was told her by her family doctor she had seen them yesterday and had a unremarkable visits and then was called today and told that her hemoglobin was low and she had to come to the hospital.  She tells me that she has some chronic discomfort from arthritis and is generally a bit weak and fatigued but maybe it was a little bit worse over the past week or so.  She had some dark stool couple weeks ago but has completely resolved.  She tells me that she looks that her stools very frequently and denies any dark or bloody stool.  She has been taking different herbs off-and-on.  She wonders if this has something to do with it.  She denies any blood thinner use.  Denies abdominal discomfort.  Denies fevers denies cough or congestion.  Denies any other area of bleeding.   Abnormal Lab     Home Medications Prior to Admission medications   Medication Sig Start Date End Date Taking? Authorizing Provider  amLODipine (NORVASC) 10 MG tablet Take 10 mg by mouth daily.    [provider]  aspirin 81 MG tablet Take 81 mg by mouth daily.    [provider]  carvedilol (COREG) 3.125 MG tablet Take 3.125 mg by mouth 2 (two) times daily. 03/15/21   [provider]  gabapentin (NEURONTIN) 100 MG capsule Take 1 tablet at bedtime x 1 week, then increase to 1 tablet twice daily. 04/27/21   Nita Sickle K, DO  indomethacin (INDOCIN) 50 MG capsule Take 50 mg by mouth daily as needed for mild pain. gout     [provider]  ketorolac (ACULAR) 0.4 % SOLN Place 1 drop into the left eye daily. 07/23/16   [provider]   l-methylfolate-B6-B12 (METANX) 3-35-2 MG TABS tablet Take 1 tablet by mouth daily.    [provider]  levothyroxine (SYNTHROID, LEVOTHROID) 50 MCG tablet Take 50 mcg by mouth once a week.     [provider]  LORazepam (ATIVAN) 1 MG tablet Take 1 mg by mouth 3 (three) times daily as needed (Dizziness).    [provider]  meclizine (ANTIVERT) 25 MG tablet Take 25 mg by mouth every 6 (six) hours as needed. vertigo    [provider]  meclizine (ANTIVERT) 25 MG tablet Take 1 tablet (25 mg total) by mouth 3 (three) times daily as needed for dizziness. 08/13/16   Kirichenko, Tatyana, PA-C  meloxicam (MOBIC) 15 MG tablet Take 15 mg by mouth daily. 06/13/16   [provider]  metFORMIN (GLUCOPHAGE) 500 MG tablet Take 500 mg by mouth daily.    [provider]  Omega-3 Fatty Acids (FISH OIL BURP-LESS) 1000 MG CAPS Take 1,000 mg by mouth daily.    [provider]  traMADol (ULTRAM) 50 MG tablet Take 50 mg by mouth 2 (two) times daily as needed for moderate pain. Maximum dose= 8 tablets per day pain     [provider]  TURMERIC PO turmeric Patient not taking: Reported on 04/27/2021    [provider]      Allergies  Aspirin and Nsaids    Review of Systems   Review of Systems  Physical Exam Updated Vital Signs BP (!) 154/77    Pulse 84    Temp 97.7 F (36.5 C)    Resp 15    Ht 5\' 5"  (1.651 m)    Wt 74.2 kg    SpO2 97%    BMI 27.21 kg/m  Physical Exam Vitals and nursing note reviewed.  Constitutional:      General: She is not in acute distress.    Appearance: She is well-developed. She is not diaphoretic.  HENT:     Head: Normocephalic and atraumatic.  Eyes:     Pupils: Pupils are equal, round, and reactive to light.  Cardiovascular:     Rate and Rhythm: Normal rate and regular rhythm.     Heart sounds: No murmur heard.   No friction rub. No gallop.  Pulmonary:     Effort: Pulmonary effort is normal.      Breath sounds: No wheezing or rales.  Abdominal:     General: There is no distension.     Palpations: Abdomen is soft.     Tenderness: There is no abdominal tenderness.  Musculoskeletal:        General: No tenderness.     Cervical back: Normal range of motion and neck supple.  Skin:    General: Skin is warm and dry.  Neurological:     Mental Status: She is alert and oriented to person, place, and time.  Psychiatric:        Behavior: Behavior normal.    ED Results / Procedures / Treatments   Labs (all labs ordered are listed, but only abnormal results are displayed) Labs Reviewed  CBC WITH DIFFERENTIAL/PLATELET  COMPREHENSIVE METABOLIC PANEL  TYPE AND SCREEN    EKG None  Radiology No results found.  Procedures Procedures  {Document cardiac monitor, telemetry assessment procedure when appropriate:1}  Medications Ordered in ED Medications - No data to display  ED Course/ Medical Decision Making/ A&P                           Medical Decision Making Amount and/or Complexity of Data Reviewed Labs: ordered.   87 yo F with a chief complaints of acute anemia.  Her old records reviewed looks like her last hemoglobin was checked about a year or so ago and was 10.  Reportedly was 7 today.  She denies any obvious signs or symptoms of GI bleeding except for a few weeks ago she thought it was dark and then resolved.  She is declining a rectal exam at this time.  We will obtain a laboratory evaluation.  {Document critical care time when appropriate:1} {Document review of labs and clinical decision tools ie heart score, Chads2Vasc2 etc:1}  {Document your independent review of radiology images, and any outside records:1} {Document your discussion with family members, caretakers, and with consultants:1} {Document social determinants of health affecting pt's care:1} {Document your decision making why or why not admission, treatments were needed:1} Final Clinical Impression(s) / ED  Diagnoses Final diagnoses:  None    Rx / DC Orders ED Discharge Orders     None

## 2021-12-15 NOTE — Plan of Care (Signed)

## 2021-12-16 DIAGNOSIS — K921 Melena: Secondary | ICD-10-CM | POA: Diagnosis not present

## 2021-12-16 DIAGNOSIS — K5730 Diverticulosis of large intestine without perforation or abscess without bleeding: Secondary | ICD-10-CM | POA: Diagnosis not present

## 2021-12-16 DIAGNOSIS — D649 Anemia, unspecified: Secondary | ICD-10-CM | POA: Diagnosis not present

## 2021-12-16 DIAGNOSIS — K259 Gastric ulcer, unspecified as acute or chronic, without hemorrhage or perforation: Secondary | ICD-10-CM | POA: Diagnosis not present

## 2021-12-16 LAB — CBC WITH DIFFERENTIAL/PLATELET
Abs Immature Granulocytes: 0.03 10*3/uL (ref 0.00–0.07)
Basophils Absolute: 0 10*3/uL (ref 0.0–0.1)
Basophils Relative: 1 %
Eosinophils Absolute: 0.1 10*3/uL (ref 0.0–0.5)
Eosinophils Relative: 1 %
HCT: 28 % — ABNORMAL LOW (ref 36.0–46.0)
Hemoglobin: 9 g/dL — ABNORMAL LOW (ref 12.0–15.0)
Immature Granulocytes: 1 %
Lymphocytes Relative: 33 %
Lymphs Abs: 1.8 10*3/uL (ref 0.7–4.0)
MCH: 25.1 pg — ABNORMAL LOW (ref 26.0–34.0)
MCHC: 32.1 g/dL (ref 30.0–36.0)
MCV: 78.2 fL — ABNORMAL LOW (ref 80.0–100.0)
Monocytes Absolute: 0.7 10*3/uL (ref 0.1–1.0)
Monocytes Relative: 12 %
Neutro Abs: 2.8 10*3/uL (ref 1.7–7.7)
Neutrophils Relative %: 52 %
Platelets: 257 10*3/uL (ref 150–400)
RBC: 3.58 MIL/uL — ABNORMAL LOW (ref 3.87–5.11)
RDW: 19.4 % — ABNORMAL HIGH (ref 11.5–15.5)
WBC: 5.3 10*3/uL (ref 4.0–10.5)
nRBC: 0 % (ref 0.0–0.2)

## 2021-12-16 LAB — BASIC METABOLIC PANEL
Anion gap: 6 (ref 5–15)
BUN: 17 mg/dL (ref 8–23)
CO2: 22 mmol/L (ref 22–32)
Calcium: 8.6 mg/dL — ABNORMAL LOW (ref 8.9–10.3)
Chloride: 111 mmol/L (ref 98–111)
Creatinine, Ser: 1.28 mg/dL — ABNORMAL HIGH (ref 0.44–1.00)
GFR, Estimated: 41 mL/min — ABNORMAL LOW (ref >60)
Glucose, Bld: 97 mg/dL (ref 70–99)
Potassium: 4.4 mmol/L (ref 3.5–5.1)
Sodium: 139 mmol/L (ref 135–145)

## 2021-12-16 LAB — ABO/RH: ABO/RH(D): O POS

## 2021-12-16 MED ORDER — PEG-KCL-NACL-NASULF-NA ASC-C 100 G PO SOLR: 1.0000 | Freq: Two times a day (BID) | ORAL | Status: DC

## 2021-12-16 MED ORDER — PEG-KCL-NACL-NASULF-NA ASC-C 100 G PO SOLR
0.5000 | Freq: Once | ORAL | Status: AC
  Administered 2021-12-17: 100 g via ORAL

## 2021-12-16 MED ORDER — PEG-KCL-NACL-NASULF-NA ASC-C 100 G PO SOLR
0.5000 | Freq: Once | ORAL | Status: AC
  Administered 2021-12-16: 100 g via ORAL
  Filled 2021-12-16: qty 1

## 2021-12-16 NOTE — Consult Note (Addendum)
Consultation Note   Referring Provider: [facility] PCP: Irena Reichmann, DO Primary Gastroenterology: [facility] Reason for consultation: anemia                                Assessment and Plan   # 87 yo female with severe iron deficiency anemia, hgb 6.9, ferritin 5. Intermittent nausea / vomiting for a year but no significant weight loss. She had a black stool 6 weeks ago, none since. Suspect slow upper GI bleed, possibly from erosions / PUD related to Indocin + prednisone. Of course colon neoplasm is a consideration --Transfused 2 u PRBC this am. Follow up hgb not resulted --We discussed EGD and possible colonoscopy. She wants to think about it. She is rightfully concerned about undergoing procedures at her age. However she also understands risk of not proceeding such as continued bleeding and / or missed treatable lesion or neoplasm.   # Additional medical history listed below.    History of Present Illness:   Patient Profile:  Julie [name] is a 87 y.o. female with a past medical history significant for hypertension, DM 2, hypothyroidism, diverticulosis, cholelithiasis see [facility] for any additional medical problems.   Patient presented to ED yesterday after receiving call from PCP that hgb was low. In ED hgb was 6.9, baseline unknown. WBC and platelets normal. MCV 75. Ferritin 5. Cr 1.29, GFR 41.   Patient had a black stool 6 weeks ago which she thought was from Omega supplement or spinach. No black stools since. Mobic on home med list but she says she doesn't take it. She takes a baby asa but no more than 2-3 times a month. She does take Indocin everyday and is also on a daily low dose of prednisone. She has had intermittent nausea and vomiting for a year without significant weight loss. No abdominal pain. No red blood in stool. She has chronic constipation treated with laxatives. Her last colonoscopy is unknown but she feel sure it  was greater than 10 years ago somewhere in [place].    Diagnostic Studies This Admission   Imaging:   No results found.     Recent Labs    12/15/21 1422  WBC 6.5  HGB 6.9*  HCT 22.8*  PLT 306   Recent Labs    12/15/21 1422  NA 139  K 4.8  CL 110  CO2 23  GLUCOSE 109*  BUN 26*  CREATININE 1.29*  CALCIUM 9.0   Recent Labs    12/15/21 1422  PROT 7.2  ALBUMIN 3.7  AST 25  ALT 13  ALKPHOS 85  BILITOT 0.3   No results for input(s): HEPBSAG, HCVAB, HEPAIGM, HEPBIGM in the last 72 hours. No results for input(s): LABPROT, INR in the last 72 hours.  Past Medical History:  Diagnosis Date   Asthma    Hypertension     Past Surgical History:  Procedure Laterality Date   foot surgery     ovarina cyst removal     ovarian cyst removal    No family history on file.  Prior to Admission medications   Medication Sig Start Date End Date Taking? Authorizing Provider  carvedilol (COREG) 3.125  MG tablet Take 3.125 mg by mouth 2 (two) times daily. 03/15/21  Yes [provider]  levothyroxine (SYNTHROID, LEVOTHROID) 50 MCG tablet Take 50 mcg by mouth every Sunday.   Yes [provider]  traMADol (ULTRAM) 50 MG tablet Take 100 mg by mouth 2 (two) times daily as needed for moderate pain. Maximum dose= 8 tablets per day pain   Yes [provider]  amLODipine (NORVASC) 10 MG tablet Take 10 mg by mouth daily.    [provider]  aspirin 81 MG tablet Take 81 mg by mouth daily.    [provider]  gabapentin (NEURONTIN) 100 MG capsule Take 1 tablet at bedtime x 1 week, then increase to 1 tablet twice daily. 04/27/21   Nita Sickle K, DO  indomethacin (INDOCIN) 50 MG capsule Take 50 mg by mouth daily as needed for mild pain. gout     [provider]  ketorolac (ACULAR) 0.4 % SOLN Place 1 drop into the left eye daily. 07/23/16   [provider]  l-methylfolate-B6-B12 (METANX) 3-35-2 MG TABS tablet Take 1 tablet by mouth daily.     [provider]  LORazepam (ATIVAN) 1 MG tablet Take 1 mg by mouth 3 (three) times daily as needed (Dizziness).    [provider]  meclizine (ANTIVERT) 25 MG tablet Take 25 mg by mouth every 6 (six) hours as needed. vertigo    [provider]  meclizine (ANTIVERT) 25 MG tablet Take 1 tablet (25 mg total) by mouth 3 (three) times daily as needed for dizziness. 08/13/16   Kirichenko, Tatyana, PA-C  meloxicam (MOBIC) 15 MG tablet Take 15 mg by mouth daily. 06/13/16   [provider]  metFORMIN (GLUCOPHAGE) 500 MG tablet Take 500 mg by mouth daily.    [provider]  Omega-3 Fatty Acids (FISH OIL BURP-LESS) 1000 MG CAPS Take 1,000 mg by mouth daily.    [provider]  predniSONE (DELTASONE) 5 MG tablet Take 5 mg by mouth.    [provider]  TURMERIC PO turmeric Patient not taking: Reported on 04/27/2021    [provider]    Current Facility-Administered Medications  Medication Dose Route Frequency Provider Last Rate Last Admin   0.9 %  sodium chloride infusion (Manually program via Guardrails IV Fluids)   Intravenous Once Regalado, Belkys A, MD 10 mL/hr at 12/16/21 0825 Restarted at 12/16/21 0825   acetaminophen (TYLENOL) tablet 650 mg  650 mg Oral Q6H PRN Regalado, Belkys A, MD       Or   acetaminophen (TYLENOL) suppository 650 mg  650 mg Rectal Q6H PRN Regalado, Belkys A, MD       amLODipine (NORVASC) tablet 10 mg  10 mg Oral Daily Regalado, Belkys A, MD   10 mg at 12/16/21 0855   carvedilol (COREG) tablet 3.125 mg  3.125 mg Oral BID Regalado, Belkys A, MD   3.125 mg at 12/16/21 0855   hydrALAZINE (APRESOLINE) injection 5 mg  5 mg Intravenous Q6H PRN Regalado, Belkys A, MD       [START ON 12/17/2021] levothyroxine (SYNTHROID) tablet 50 mcg  50 mcg Oral Q Sun Regalado, Belkys A, MD       LORazepam (ATIVAN) tablet 1 mg  1 mg Oral TID PRN Regalado, Belkys A, MD       ondansetron (ZOFRAN) tablet 4 mg  4 mg Oral Q6H PRN  Regalado, Belkys A, MD       Or   ondansetron (ZOFRAN) injection 4  mg  4 mg Intravenous Q6H PRN Regalado, Belkys A, MD       pantoprazole (PROTONIX) injection 40 mg  40 mg Intravenous Q12H Regalado, Belkys A, MD   40 mg at 12/16/21 0856   senna-docusate (Senokot-S) tablet 1 tablet  1 tablet Oral QHS PRN Regalado, Belkys A, MD       traMADol (ULTRAM) tablet 100 mg  100 mg Oral BID PRN Regalado, Belkys A, MD        Allergies as of 12/15/2021 - Review Complete 12/15/2021  Allergen Reaction Noted   Ace inhibitors  12/15/2021   Amrix [cyclobenzaprine hcl]  12/15/2021   Aspirin Other (See Comments) 09/11/2011   Fluticasone-salmeterol  12/15/2021   Nifedipine  12/15/2021   Nsaids  08/13/2016   Pregabalin  12/15/2021   Zocor [simvastatin]  12/15/2021    Social History   Socioeconomic History   Marital status: Married    Spouse name: Not on file   Number of children: Not on file   Years of education: Not on file   Highest education level: Not on file  Occupational History   Not on file  Tobacco Use   Smoking status: Former   Smokeless tobacco: Never  Substance and Sexual Activity   Alcohol use: No   Drug use: No   Sexual activity: Never  Other Topics Concern   Not on file  Social History Narrative   Right handed   Lives in 3 story building   Drinks caffeine   Social Determinants of Health   Financial Resource Strain: Not on file  Food Insecurity: Not on file  Transportation Needs: Not on file  Physical Activity: Not on file  Stress: Not on file  Social Connections: Not on file  Intimate Partner Violence: Not on file    Review of Systems: All systems reviewed and negative except where noted in HPI.  Physical Exam: Vital signs in last 24 hours: Temp:  [97.7 F (36.5 C)-99.2 F (37.3 C)] 98.4 F (36.9 C) (03/11 0913) Pulse Rate:  [64-92] 79 (03/11 0913) Resp:  [12-26] 18 (03/11 0913) BP: (118-155)/(53-96) 153/79 (03/11 0913) SpO2:  [95 %-99 %] 99 % (03/11  0913) Weight:  [74.2 kg] 74.2 kg (03/10 1415)    General:  Alert female in NAD Psych:  Pleasant, cooperative. Normal mood and affect Eyes: Pupils equal, no icterus. Conjunctive pink Ears:  Normal auditory acuity Nose: No deformity, discharge or lesions Neck:  Supple, no masses felt Lungs:  Clear to auscultation.  Heart:  Regular rate, regular rhythm. No lower extremity edema Abdomen:  Soft, nondistended, nontender, active bowel sounds, no masses felt Rectal :  Deferred Msk: Symmetrical without gross deformities.  Neurologic:  Alert, oriented, grossly normal neurologically Skin:  Intact without significant lesions.    Willette ClusterPaula Guenther, NP-C @  12/16/2021, 9:23 AM  _______________________________________________________________________________________________________________________________________  [facility] MD note:  I personally examined the patient, reviewed the data and agree with the assessment and plan described above.  I provided a substantive portion of the care of this patient (personally provided more than half of the total time dedicated to the treatment of this patient). 87 yo woman with severe IDA, Hb 6.9.  Single dark stool several weeks ago but no other GI symptoms. She takes daily NSAIDs.  Last colonoscoyp >10 years ago. No FH of CRC.  I recommended colonoscopy and EGD tomorrow for further evaluation and she agreed.     Rob Buntinganiel Demontre Padin, MD [facility] Pager [phone]

## 2021-12-16 NOTE — Assessment & Plan Note (Signed)
Renal function stable. Cr 1.2.  ?

## 2021-12-16 NOTE — Progress Notes (Signed)
?  Progress Note ? ? ?Patient: Julie [name] [id number] DOB: 12/31/1934 DOA: 12/15/2021     0 ?DOS: the patient was seen and examined on 12/16/2021 ?  ?Brief hospital course: ?87 year old past medical history significant for hypertension, diabetes type 2, hypothyroidism presented with abnormal lab work hemoglobin 6.9.  She report few weeks ago black stool that has resolved.  She reported history of abdominal pain in the left side that has resolved. ? ?Patient admitted for further evaluation of iron deficiency anemia, require blood transfusion.  GI has been consulted. ? ?Assessment and Plan: ?* Severe anemia ?-Presents with hemoglobin of 6.9. Iron deficiency.  ?-Prior hemoglobin per records 12, 5 years ago.  Per ED physician patient had hemoglobin previously at 10- one year ago. ?-Normal bilirubin, unlikely hemolysis. Normal platelet, and white count.  ?-Report Black stool weeks ago.  ?-Continue with  IV Protonix IV BID>  ?-received a total of  2 units PRBC. She will need IV iron prior to discharge.  ?-GI consulted. Plan for endoscopy/colonoscopy tomorrow.  ?Labs pending for today  ? ?CKD stage G3a/A2, GFR 45-59 and albumin creatinine ratio 30-299 mg/g (HCC) ?Renal function stable. Cr 1.2.  ? ?Type 2 diabetes mellitus without complication, with long-term current use of insulin ([place]) ?Hold Metformin.  ?Monitor.  ? ?Hypothyroidism ?Continue with Synthroid ? ?Essential hypertension ?Continue with Norvasc and carvedilol ? ?Normocytic anemia-resolved as of 12/15/2021 ? ? ? ? ? ? ? ?  ? ?Subjective: she is feeling well, denies pain, no BM ? ? ?Physical Exam: ?Vitals:  ? 12/16/21 0204 12/16/21 0445 12/16/21 0845 12/16/21 0913  ?BP: (!) 150/96 (!) 154/91 (!) 151/72 (!) 153/79  ?Pulse: 70 72 80 79  ?Resp: 18 18 18 18   ?Temp: 98.9 ?F (37.2 ?C) 98 ?F (36.7 ?C) 98.4 ?F (36.9 ?C) 98.4 ?F (36.9 ?C)  ?TempSrc: Oral Oral Oral Oral  ?SpO2: 99% 98% 98% 99%  ?Weight:      ?Height:      ? ?General; NAD ?Lung; CTA ?Abdomen; soft, nt,  nd ? ?Data Reviewed: ? ?Anemia panel reviewed.  ? ?Family Communication: care discussed with patient.  ? ?Disposition: ?Status is: Observation ?The patient remains OBS appropriate and will d/c before 2 midnights. ? Planned Discharge Destination: Home ? ? ? ?Time spent: 45 minutes ? ?Author: ?Elmarie Shiley, MD ?12/16/2021 1:08 PM ? ?For on call review [email or web address].  ?

## 2021-12-16 NOTE — H&P (View-Only) (Signed)
Consultation Note   Referring Provider: [facility] PCP: Irena Reichmann, DO Primary Gastroenterology: [facility] Reason for consultation: anemia                                Assessment and Plan   # 87 yo female with severe iron deficiency anemia, hgb 6.9, ferritin 5. Intermittent nausea / vomiting for a year but no significant weight loss. She had a black stool 6 weeks ago, none since. Suspect slow upper GI bleed, possibly from erosions / PUD related to Indocin + prednisone. Of course colon neoplasm is a consideration --Transfused 2 u PRBC this am. Follow up hgb not resulted --We discussed EGD and possible colonoscopy. She wants to think about it. She is rightfully concerned about undergoing procedures at her age. However she also understands risk of not proceeding such as continued bleeding and / or missed treatable lesion or neoplasm.   # Additional medical history listed below.    History of Present Illness:   Patient Profile:  Julie [name] is a 87 y.o. female with a past medical history significant for hypertension, DM 2, hypothyroidism, diverticulosis, cholelithiasis see PMH for any additional medical problems.   Patient presented to ED yesterday after receiving call from PCP that hgb was low. In ED hgb was 6.9, baseline unknown. WBC and platelets normal. MCV 75. Ferritin 5. Cr 1.29, GFR 41.   Patient had a black stool 6 weeks ago which she thought was from Omega supplement or spinach. No black stools since. Mobic on home med list but she says she doesn't take it. She takes a baby asa but no more than 2-3 times a month. She does take Indocin everyday and is also on a daily low dose of prednisone. She has had intermittent nausea and vomiting for a year without significant weight loss. No abdominal pain. No red blood in stool. She has chronic constipation treated with laxatives. Her last colonoscopy is unknown but she feel sure it  was greater than 10 years ago somewhere in [place].    Diagnostic Studies This Admission   Imaging:   No results found.     Recent Labs    12/15/21 1422  WBC 6.5  HGB 6.9*  HCT 22.8*  PLT 306   Recent Labs    12/15/21 1422  NA 139  K 4.8  CL 110  CO2 23  GLUCOSE 109*  BUN 26*  CREATININE 1.29*  CALCIUM 9.0   Recent Labs    12/15/21 1422  PROT 7.2  ALBUMIN 3.7  AST 25  ALT 13  ALKPHOS 85  BILITOT 0.3   No results for input(s): HEPBSAG, HCVAB, HEPAIGM, HEPBIGM in the last 72 hours. No results for input(s): LABPROT, INR in the last 72 hours.  Past Medical History:  Diagnosis Date   Asthma    Hypertension     Past Surgical History:  Procedure Laterality Date   foot surgery     ovarina cyst removal     ovarian cyst removal    No family history on file.  Prior to Admission medications   Medication Sig Start Date End Date Taking? Authorizing Provider  carvedilol (COREG) 3.125  MG tablet Take 3.125 mg by mouth 2 (two) times daily. 03/15/21  Yes [provider]  levothyroxine (SYNTHROID, LEVOTHROID) 50 MCG tablet Take 50 mcg by mouth every Sunday.   Yes [provider]  traMADol (ULTRAM) 50 MG tablet Take 100 mg by mouth 2 (two) times daily as needed for moderate pain. Maximum dose= 8 tablets per day pain   Yes [provider]  amLODipine (NORVASC) 10 MG tablet Take 10 mg by mouth daily.    [provider]  aspirin 81 MG tablet Take 81 mg by mouth daily.    [provider]  gabapentin (NEURONTIN) 100 MG capsule Take 1 tablet at bedtime x 1 week, then increase to 1 tablet twice daily. 04/27/21   Nita Sickle K, DO  indomethacin (INDOCIN) 50 MG capsule Take 50 mg by mouth daily as needed for mild pain. gout     [provider]  ketorolac (ACULAR) 0.4 % SOLN Place 1 drop into the left eye daily. 07/23/16   [provider]  l-methylfolate-B6-B12 (METANX) 3-35-2 MG TABS tablet Take 1 tablet by mouth daily.     [provider]  LORazepam (ATIVAN) 1 MG tablet Take 1 mg by mouth 3 (three) times daily as needed (Dizziness).    [provider]  meclizine (ANTIVERT) 25 MG tablet Take 25 mg by mouth every 6 (six) hours as needed. vertigo    [provider]  meclizine (ANTIVERT) 25 MG tablet Take 1 tablet (25 mg total) by mouth 3 (three) times daily as needed for dizziness. 08/13/16   Kirichenko, Tatyana, PA-C  meloxicam (MOBIC) 15 MG tablet Take 15 mg by mouth daily. 06/13/16   [provider]  metFORMIN (GLUCOPHAGE) 500 MG tablet Take 500 mg by mouth daily.    [provider]  Omega-3 Fatty Acids (FISH OIL BURP-LESS) 1000 MG CAPS Take 1,000 mg by mouth daily.    [provider]  predniSONE (DELTASONE) 5 MG tablet Take 5 mg by mouth.    [provider]  TURMERIC PO turmeric Patient not taking: Reported on 04/27/2021    [provider]    Current Facility-Administered Medications  Medication Dose Route Frequency Provider Last Rate Last Admin   0.9 %  sodium chloride infusion (Manually program via Guardrails IV Fluids)   Intravenous Once Regalado, Belkys A, MD 10 mL/hr at 12/16/21 0825 Restarted at 12/16/21 0825   acetaminophen (TYLENOL) tablet 650 mg  650 mg Oral Q6H PRN Regalado, Belkys A, MD       Or   acetaminophen (TYLENOL) suppository 650 mg  650 mg Rectal Q6H PRN Regalado, Belkys A, MD       amLODipine (NORVASC) tablet 10 mg  10 mg Oral Daily Regalado, Belkys A, MD   10 mg at 12/16/21 0855   carvedilol (COREG) tablet 3.125 mg  3.125 mg Oral BID Regalado, Belkys A, MD   3.125 mg at 12/16/21 0855   hydrALAZINE (APRESOLINE) injection 5 mg  5 mg Intravenous Q6H PRN Regalado, Belkys A, MD       [START ON 12/17/2021] levothyroxine (SYNTHROID) tablet 50 mcg  50 mcg Oral Q Sun Regalado, Belkys A, MD       LORazepam (ATIVAN) tablet 1 mg  1 mg Oral TID PRN Regalado, Belkys A, MD       ondansetron (ZOFRAN) tablet 4 mg  4 mg Oral Q6H PRN  Regalado, Belkys A, MD       Or   ondansetron (ZOFRAN) injection 4  mg  4 mg Intravenous Q6H PRN Regalado, Belkys A, MD      ° pantoprazole (PROTONIX) injection 40 mg  40 mg Intravenous Q12H Regalado, Belkys A, MD   40 mg at 12/16/21 0856  ° senna-docusate (Senokot-S) tablet 1 tablet  1 tablet Oral QHS PRN Regalado, Belkys A, MD      ° traMADol (ULTRAM) tablet 100 mg  100 mg Oral BID PRN Regalado, Belkys A, MD      ° ° °Allergies as of 12/15/2021 - Review Complete 12/15/2021  °Allergen Reaction Noted  ° Ace inhibitors  12/15/2021  ° Amrix [cyclobenzaprine hcl]  12/15/2021  ° Aspirin Other (See Comments) 09/11/2011  ° Fluticasone-salmeterol  12/15/2021  ° Nifedipine  12/15/2021  ° Nsaids  08/13/2016  ° Pregabalin  12/15/2021  ° Zocor [simvastatin]  12/15/2021  ° ° °Social History  ° °Socioeconomic History  ° Marital status: Married  °  Spouse name: Not on file  ° Number of children: Not on file  ° Years of education: Not on file  ° Highest education level: Not on file  °Occupational History  ° Not on file  °Tobacco Use  ° Smoking status: Former  ° Smokeless tobacco: Never  °Substance and Sexual Activity  ° Alcohol use: No  ° Drug use: No  ° Sexual activity: Never  °Other Topics Concern  ° Not on file  °Social History Narrative  ° Right handed  ° Lives in 3 story building  ° Drinks caffeine  ° °Social Determinants of Health  ° °Financial Resource Strain: Not on file  °Food Insecurity: Not on file  °Transportation Needs: Not on file  °Physical Activity: Not on file  °Stress: Not on file  °Social Connections: Not on file  °Intimate Partner Violence: Not on file  ° ° °Review of Systems: °All systems reviewed and negative except where noted in HPI. ° °Physical Exam: °Vital signs in last 24 hours: °Temp:  [97.7 °F (36.5 °C)-99.2 °F (37.3 °C)] 98.4 °F (36.9 °C) (03/11 0913) °Pulse Rate:  [64-92] 79 (03/11 0913) °Resp:  [12-26] 18 (03/11 0913) °BP: (118-155)/(53-96) 153/79 (03/11 0913) °SpO2:  [95 %-99 %] 99 % (03/11  0913) °Weight:  [74.2 kg] 74.2 kg (03/10 1415) °  ° °General:  Alert female in NAD °Psych:  Pleasant, cooperative. Normal mood and affect °Eyes: Pupils equal, no icterus. Conjunctive pink °Ears:  Normal auditory acuity °Nose: No deformity, discharge or lesions °Neck:  Supple, no masses felt °Lungs:  Clear to auscultation.  °Heart:  Regular rate, regular rhythm. No lower extremity edema °Abdomen:  Soft, nondistended, nontender, active bowel sounds, no masses felt °Rectal :  Deferred °Msk: Symmetrical without gross deformities.  °Neurologic:  Alert, oriented, grossly normal neurologically °Skin:  Intact without significant lesions.  ° ° °Paula Guenther, NP-C @  12/16/2021, 9:23 AM ° °_______________________________________________________________________________________________________________________________________ ° °[facility] MD note: ° °I personally examined the patient, reviewed the data and agree with the assessment and plan described above.  I provided a substantive portion of the care of this patient (personally provided more than half of the total time dedicated to the treatment of this patient). 86 yo woman with severe IDA, Hb 6.9.  Single dark stool several weeks ago but no other GI symptoms. She takes daily NSAIDs.  Last colonoscoyp >10 years ago. No FH of CRC.  I recommended colonoscopy and EGD tomorrow for further evaluation and she agreed.   ° ° °Jaelyn Cloninger, MD °[facility] °Pager [phone] ° ° ° °

## 2021-12-16 NOTE — Hospital Course (Addendum)
87 year old past medical history significant for hypertension, diabetes type 2, hypothyroidism presented with abnormal lab work hemoglobin 6.9.  She report few weeks ago black stool that has resolved.  She reported history of abdominal pain in the left side that has resolved. ? ?Patient admitted for further evaluation of iron deficiency anemia, require blood transfusion.  GI has been consulted. Underwent endoscopy showed gastric ulcer not actively bleeding. Plan to discharge to PPI BID> follow up with GI 2 months for repeat endoscopy.  ?

## 2021-12-17 ENCOUNTER — Encounter (HOSPITAL_COMMUNITY): Payer: Self-pay | Admitting: Internal Medicine

## 2021-12-17 ENCOUNTER — Encounter (HOSPITAL_COMMUNITY)
Admission: EM | Disposition: A | Discharge status: Home / Self Care | Payer: Self-pay | Source: Home / Self Care | Attending: Emergency Medicine

## 2021-12-17 ENCOUNTER — Observation Stay (HOSPITAL_COMMUNITY): Payer: Medicare Other | Admitting: Certified Registered"

## 2021-12-17 ENCOUNTER — Observation Stay (HOSPITAL_BASED_OUTPATIENT_CLINIC_OR_DEPARTMENT_OTHER): Payer: Medicare Other | Admitting: Certified Registered"

## 2021-12-17 DIAGNOSIS — K449 Diaphragmatic hernia without obstruction or gangrene: Secondary | ICD-10-CM | POA: Diagnosis not present

## 2021-12-17 DIAGNOSIS — K251 Acute gastric ulcer with perforation: Secondary | ICD-10-CM

## 2021-12-17 DIAGNOSIS — K5730 Diverticulosis of large intestine without perforation or abscess without bleeding: Secondary | ICD-10-CM | POA: Diagnosis not present

## 2021-12-17 DIAGNOSIS — D649 Anemia, unspecified: Secondary | ICD-10-CM | POA: Diagnosis not present

## 2021-12-17 DIAGNOSIS — K921 Melena: Secondary | ICD-10-CM

## 2021-12-17 DIAGNOSIS — K259 Gastric ulcer, unspecified as acute or chronic, without hemorrhage or perforation: Secondary | ICD-10-CM | POA: Diagnosis not present

## 2021-12-17 DIAGNOSIS — K648 Other hemorrhoids: Secondary | ICD-10-CM | POA: Diagnosis not present

## 2021-12-17 DIAGNOSIS — K254 Chronic or unspecified gastric ulcer with hemorrhage: Secondary | ICD-10-CM | POA: Diagnosis not present

## 2021-12-17 DIAGNOSIS — K2950 Unspecified chronic gastritis without bleeding: Secondary | ICD-10-CM | POA: Diagnosis not present

## 2021-12-17 DIAGNOSIS — I10 Essential (primary) hypertension: Secondary | ICD-10-CM | POA: Diagnosis not present

## 2021-12-17 HISTORY — PX: ESOPHAGOGASTRODUODENOSCOPY (EGD) WITH PROPOFOL: SHX5813

## 2021-12-17 HISTORY — PX: BIOPSY: SHX5522

## 2021-12-17 HISTORY — PX: COLONOSCOPY WITH PROPOFOL: SHX5780

## 2021-12-17 LAB — TYPE AND SCREEN
ABO/RH(D): O POS
Antibody Screen: NEGATIVE
Unit division: 0
Unit division: 0

## 2021-12-17 LAB — BPAM RBC
Blood Product Expiration Date: 202304052359
Blood Product Expiration Date: 202304052359
ISSUE DATE / TIME: 202303110144
ISSUE DATE / TIME: 202303110847
Unit Type and Rh: 5100
Unit Type and Rh: 5100

## 2021-12-17 LAB — CBC
HCT: 32.9 % — ABNORMAL LOW (ref 36.0–46.0)
Hemoglobin: 10.5 g/dL — ABNORMAL LOW (ref 12.0–15.0)
MCH: 25 pg — ABNORMAL LOW (ref 26.0–34.0)
MCHC: 31.9 g/dL (ref 30.0–36.0)
MCV: 78.3 fL — ABNORMAL LOW (ref 80.0–100.0)
Platelets: 298 10*3/uL (ref 150–400)
RBC: 4.2 MIL/uL (ref 3.87–5.11)
RDW: 20.2 % — ABNORMAL HIGH (ref 11.5–15.5)
WBC: 6.9 10*3/uL (ref 4.0–10.5)
nRBC: 0 % (ref 0.0–0.2)

## 2021-12-17 SURGERY — COLONOSCOPY WITH PROPOFOL
Anesthesia: Monitor Anesthesia Care

## 2021-12-17 MED ORDER — PROPOFOL 10 MG/ML IV BOLUS
INTRAVENOUS | Status: DC | PRN
  Administered 2021-12-17: 20 mg via INTRAVENOUS
  Administered 2021-12-17: 10 mg via INTRAVENOUS
  Administered 2021-12-17: 20 mg via INTRAVENOUS

## 2021-12-17 MED ORDER — LIDOCAINE 2% (20 MG/ML) 5 ML SYRINGE
INTRAMUSCULAR | Status: DC | PRN
  Administered 2021-12-17: 50 mg via INTRAVENOUS

## 2021-12-17 MED ORDER — FERROUS SULFATE 325 (65 FE) MG PO TABS: 325.0000 mg | ORAL_TABLET | Freq: Every day | ORAL | 3 refills | Status: AC

## 2021-12-17 MED ORDER — PANTOPRAZOLE SODIUM 40 MG PO TBEC: 40.0000 mg | DELAYED_RELEASE_TABLET | Freq: Two times a day (BID) | ORAL | 3 refills | Status: DC

## 2021-12-17 MED ORDER — SODIUM CHLORIDE 0.9 % IV SOLN: INTRAVENOUS | Status: DC

## 2021-12-17 MED ORDER — LACTATED RINGERS IV SOLN: INTRAVENOUS | Status: DC | PRN

## 2021-12-17 MED ORDER — PROPOFOL 10 MG/ML IV BOLUS
INTRAVENOUS | Status: AC
  Filled 2021-12-17: qty 20

## 2021-12-17 MED ORDER — PROPOFOL 1000 MG/100ML IV EMUL
INTRAVENOUS | Status: AC
  Filled 2021-12-17: qty 100

## 2021-12-17 MED ORDER — PROPOFOL 500 MG/50ML IV EMUL
INTRAVENOUS | Status: DC | PRN
  Administered 2021-12-17: 100 ug/kg/min via INTRAVENOUS

## 2021-12-17 MED ORDER — SODIUM CHLORIDE 0.9 % IV SOLN
250.0000 mg | Freq: Once | INTRAVENOUS | Status: AC
  Administered 2021-12-17: 250 mg via INTRAVENOUS
  Filled 2021-12-17: qty 20

## 2021-12-17 SURGICAL SUPPLY — 25 items

## 2021-12-17 NOTE — Anesthesia Postprocedure Evaluation (Signed)
Anesthesia Post Note ? ?Patient: Julie [name] ? ?Procedure(s) Performed: COLONOSCOPY WITH PROPOFOL ?ESOPHAGOGASTRODUODENOSCOPY (EGD) WITH PROPOFOL ?BIOPSY ? ?  ? ?Patient location during evaluation: Endoscopy ?Anesthesia Type: MAC ?Level of consciousness: awake and alert ?Pain management: pain level controlled ?Vital Signs Assessment: post-procedure vital signs reviewed and stable ?Respiratory status: spontaneous breathing, nonlabored ventilation, respiratory function stable and patient connected to nasal cannula oxygen ?Cardiovascular status: stable and blood pressure returned to baseline ?Postop Assessment: no apparent nausea or vomiting ?Anesthetic complications: no ? ? ?No notable events documented. ? ?Last Vitals:  ?Vitals:  ? 12/17/21 1051 12/17/21 1108  ?BP: (!) 159/71 (!) 165/74  ?Pulse: 82 89  ?Resp: 16 19  ?Temp:  (!) 36.4 ?C  ?SpO2: 100% 98%  ?  ?Last Pain:  ?Vitals:  ? 12/17/21 1108  ?TempSrc: Temporal  ?PainSc: 0-No pain  ? ? ?  ?  ?  ?  ?  ?  ? ?Javanni Maring L [name] ? ? ? ? ?

## 2021-12-17 NOTE — Transfer of Care (Signed)
Immediate Anesthesia Transfer of Care Note ? ?Patient: Julie [name] ? ?Procedure(s) Performed: COLONOSCOPY WITH PROPOFOL ?ESOPHAGOGASTRODUODENOSCOPY (EGD) WITH PROPOFOL ?BIOPSY ? ?Patient Location: Endoscopy Unit ? ?Anesthesia Type:MAC ? ?Level of Consciousness: awake, alert , oriented, drowsy and patient cooperative ? ?Airway & Oxygen Therapy: Patient Spontanous Breathing and Patient connected to face mask oxygen ? ?Post-op Assessment: Report given to RN and Post -op Vital signs reviewed and stable ? ?Post vital signs: Reviewed and stable ? ?Last Vitals:  ?Vitals Value Taken Time  ?BP 155/82 1039  ?Temp    ?Pulse 74 12/17/21 1039  ?Resp 21 12/17/21 1039  ?SpO2 100 % 12/17/21 1039  ?Vitals shown include unvalidated device data. ? ?Last Pain:  ?Vitals:  ? 12/17/21 0908  ?TempSrc: Temporal  ?PainSc: 0-No pain  ?   ? ?  ? ?Complications: No notable events documented. ?

## 2021-12-17 NOTE — Evaluation (Signed)
Physical Therapy Evaluation-1x ?Patient Details ?Name: Julie [name] ?MRN: [id number] ?DOB: August 08, 1935 ?Today's Date: 12/17/2021 ? ?History of Present Illness ? 87 yo female admitted with anemia. hx of arthritis, DM.  ?Clinical Impression ? On eval, pt was Min guard assist for mobility. She walked ~50 feet with a RW. Pt reports 8/10 pain in bil shoulders and back (all chronic). She tends to mobilize in her preferred manner despite cues/suggestions from therapist. She declines any HHPT f/u. She has an aide that assists her as needed. Plan is for d/c home on today. 1x eval. Will sign off.    ?   ? ?Recommendations for follow up therapy are one component of a multi-disciplinary discharge planning process, led by the attending physician.  Recommendations may be updated based on patient status, additional functional criteria and insurance authorization. ? ?Follow Up Recommendations No PT follow up ? ?  ?Assistance Recommended at Discharge PRN  ?Patient can return home with the following ? Assistance with cooking/housework;Assist for transportation;Help with stairs or ramp for entrance ? ?  ?Equipment Recommendations None recommended by PT  ?Recommendations for Other Services ?    ?  ?Functional Status Assessment    ? ?  ?Precautions / Restrictions Precautions ?Precautions: Fall ?Restrictions ?Weight Bearing Restrictions: No  ? ?  ? ?Mobility ? Bed Mobility ?Overal bed mobility: Modified Independent ?  ?  ?  ?  ?  ?  ?  ?  ? ?Transfers ?Overall transfer level: Needs assistance ?Equipment used: Rolling walker (2 wheels) ?Transfers: Sit to/from Stand ?Sit to Stand: Supervision ?  ?  ?  ?  ?  ?  ?  ? ?Ambulation/Gait ?Ambulation/Gait assistance: Min guard ?Gait Distance (Feet): 50 Feet ?Assistive device: Rolling walker (2 wheels) ?Gait Pattern/deviations: Step-through pattern, Decreased stride length ?  ?  ?  ?General Gait Details: Cues for safety, posture. Pt mobilizes in her preferred manner (flexed trunk with forearms  resting on armrests). She c/o back pain and shoulder pain but she insists on walking in this position. O2 99%, HR 116 bpm. Dyspnea 2/4 ? ?Stairs ?  ?  ?  ?  ?  ? ?Wheelchair Mobility ?  ? ?Modified Rankin (Stroke Patients Only) ?  ? ?  ? ?Balance Overall balance assessment: Needs assistance ?  ?  ?  ?  ?Standing balance support: Bilateral upper extremity supported, During functional activity ?Standing balance-Leahy Scale: Fair ?  ?  ?  ?  ?  ?  ?  ?  ?  ?  ?  ?  ?   ? ? ? ?Pertinent Vitals/Pain Pain Assessment ?Pain Assessment: 0-10 ?Pain Score: 8  ?Pain Location: bil shoulders, back (chronic) ?Pain Descriptors / Indicators: Discomfort, Sore ?Pain Intervention(s): Limited activity within patient's tolerance, Monitored during session, Repositioned  ? ? ?Home Living Family/patient expects to be discharged to:: Private residence ?Living Arrangements: Alone ?Available Help at Discharge: Personal care attendant (3x/week a few hours daily) ?Type of Home: Apartment ?Home Access: Elevator ?  ?  ?  ?Home Layout: One level ?Home Equipment: Rollator (4 wheels);Rolling Walker (2 wheels);Cane - single point;Shower seat;BSC/3in1 ?   ?  ?Prior Function Prior Level of Function : Independent/Modified Independent ?  ?  ?  ?  ?  ?  ?  ?  ?  ? ? ?Hand Dominance  ?   ? ?  ?Extremity/Trunk Assessment  ? Upper Extremity Assessment ?Upper Extremity Assessment: Generalized weakness ?  ? ?Lower Extremity Assessment ?Lower Extremity Assessment: Generalized weakness ?  ? ?  Cervical / Trunk Assessment ?Cervical / Trunk Assessment: Kyphotic  ?Communication  ?    ?Cognition Arousal/Alertness: Awake/alert ?Behavior During Therapy: [facility] for tasks assessed/performed ?Overall Cognitive Status: Within Functional Limits for tasks assessed ?  ?  ?  ?  ?  ?  ?  ?  ?  ?  ?  ?  ?  ?  ?  ?  ?  ?  ?  ? ?  ?General Comments   ? ?  ?Exercises    ? ?Assessment/Plan  ?  ?PT Assessment Patient does not need any further PT services (plan is for d/c home today. She  declines HHPT f/u)  ?PT Problem List   ? ?   ?  ?PT Treatment Interventions     ? ?PT Goals (Current goals can be found in the Care Plan section)  ?Acute Rehab PT Goals ?Patient Stated Goal: home today. ?PT Goal Formulation: All assessment and education complete, DC therapy ? ?  ?Frequency   ?  ? ? ?Co-evaluation   ?  ?  ?  ?  ? ? ?  ?AM-PAC PT "6 Clicks" Mobility  ?Outcome Measure Help needed turning from your back to your side while in a flat bed without using bedrails?: None ?Help needed moving from lying on your back to sitting on the side of a flat bed without using bedrails?: None ?Help needed moving to and from a bed to a chair (including a wheelchair)?: A Little ?Help needed standing up from a chair using your arms (e.g., wheelchair or bedside chair)?: A Little ?Help needed to walk in hospital room?: A Little ?Help needed climbing 3-5 steps with a railing? : A Little ?6 Click Score: 20 ? ?  ?End of Session   ?Activity Tolerance: Patient limited by pain;Patient limited by fatigue ?Patient left: in bed;with call bell/phone within reach;with family/visitor present ?  ?  ?  ? ?Time: [id number] ?PT Time Calculation (min) (ACUTE ONLY): 13 min ? ? ?Charges:   PT Evaluation ?$PT Eval Moderate Complexity: 1 Mod ?  ?  ?   ? ? ? ? ?Jayshon Dommer P, PT ?Acute Rehabilitation  ?Office: [phone] ?Pager: [phone] ? ?  ? ?

## 2021-12-17 NOTE — Interval H&P Note (Signed)
History and Physical Interval Note: ? ?12/17/2021 ?9:04 AM ? ?Julie [name]  has presented today for surgery, with the diagnosis of severe IDA, hemocult +.  The various methods of treatment have been discussed with the patient and family. After consideration of risks, benefits and other options for treatment, the patient has consented to  Procedure(s): ?COLONOSCOPY WITH PROPOFOL (N/A) ?ESOPHAGOGASTRODUODENOSCOPY (EGD) WITH PROPOFOL (N/A) as a surgical intervention.  The patient's history has been reviewed, patient examined, no change in status, stable for surgery.  I have reviewed the patient's chart and labs.  Questions were answered to the patient's satisfaction.   ? ? ?Rachael Fee ? ? ?

## 2021-12-17 NOTE — Op Note (Addendum)
[facility] ?Patient Name: Julie [name] ?Procedure Date: 12/17/2021 ?MRN: [id number] ?Attending MD: Milus Banister , MD ?Date of Birth: 12/01/1934 ?CSN: [id number] ?Age: 87 ?Admit Type: Inpatient ?Procedure:                Colonoscopy ?Indications:              Heme positive stool, single black stool several  ?                          weeks ago, severe IDA ?Providers:                Milus Banister, MD, Carlyn Reichert, RN, Hinton Dyer  ?                          Technician, Technician ?Referring MD:              ?Medicines:                Monitored Anesthesia Care ?Complications:            No immediate complications. Estimated blood loss:  ?                          None. ?Estimated Blood Loss:     Estimated blood loss: none. ?Procedure:                Pre-Anesthesia Assessment: ?                          - Prior to the procedure, a History and Physical  ?                          was performed, and patient medications and  ?                          allergies were reviewed. The patient's tolerance of  ?                          previous anesthesia was also reviewed. The risks  ?                          and benefits of the procedure and the sedation  ?                          options and risks were discussed with the patient.  ?                          All questions were answered, and informed consent  ?                          was obtained. Prior Anticoagulants: The patient has  ?                          taken no previous anticoagulant or antiplatelet  ?                          agents. ASA Grade  Assessment: III - A patient with  ?                          severe systemic disease. After reviewing the risks  ?                          and benefits, the patient was deemed in  ?                          satisfactory condition to undergo the procedure. ?                          After obtaining informed consent, the colonoscope  ?                          was passed under direct vision. Throughout  the  ?                          procedure, the patient's blood pressure, pulse, and  ?                          oxygen saturations were monitored continuously. The  ?                          [id number]) Olympus colonoscope was  ?                          introduced through the anus and advanced to the the  ?                          cecum, identified by appendiceal orifice and  ?                          ileocecal valve. The colonoscopy was performed  ?                          without difficulty. The patient tolerated the  ?                          procedure well. The quality of the bowel  ?                          preparation was good. The ileocecal valve,  ?                          appendiceal orifice, and rectum were photographed. ?Scope In: 10:11:52 AM ?Scope Out: 10:20:42 AM ?Scope Withdrawal Time: 0 hours 7 minutes 6 seconds  ?Total Procedure Duration: 0 hours 8 minutes 50 seconds  ?Findings: ?     No blood in the colon. ?     Multiple small and large-mouthed diverticula were found in the right and  ?     left colon. ?     External and internal hemorrhoids were found. The hemorrhoids were  ?     medium-sized. ?     The exam was  otherwise without abnormality on direct and retroflexion  ?     views. ?Impression:               - Diverticulosis throughout the colon. ?                          - External and internal hemorrhoids. ?                          - The examination was otherwise normal on direct  ?                          and retroflexion views. ?                          - No polyps or cancers. ?Moderate Sedation: ?     Not Applicable - Patient had care per Anesthesia. ?Recommendation:           - EGD now. ?Procedure Code(s):        --- Professional --- ?                          [id number], Colonoscopy, flexible; diagnostic, including  ?                          collection of specimen(s) by brushing or washing,  ?                          when performed (separate procedure) ?Diagnosis Code(s):         --- Professional --- ?                          [id number], Other hemorrhoids ?                          R19.5, Other fecal abnormalities ?                          K57.30, Diverticulosis of large intestine without  ?                          perforation or abscess without bleeding ?CPT copyright 2019 [employer]. All rights reserved. ?The codes documented in this report are preliminary and upon coder review may  ?be revised to meet current compliance requirements. ?Milus Banister, MD ?12/17/2021 10:23:57 AM ?This report has been signed electronically. ?Number of Addenda: 0 ?

## 2021-12-17 NOTE — Op Note (Signed)
[facility] ?Patient Name: Julie [name] ?Procedure Date: 12/17/2021 ?MRN: [id number] ?Attending MD: Milus Banister , MD ?Date of Birth: Jan 16, 1935 ?CSN: [id number] ?Age: 87 ?Admit Type: Inpatient ?Procedure:                Upper GI endoscopy ?Indications:              Dark stool several weeks ago, fobt +, severe IDA;  ?                          colonoscopy just prior to this exam revealed no  ?                          obvious source ?Providers:                Milus Banister, MD, Carlyn Reichert, RN, Hinton Dyer  ?                          Technician, Technician ?Referring MD:              ?Medicines:                Monitored Anesthesia Care ?Complications:            No immediate complications. Estimated blood loss:  ?                          None. ?Estimated Blood Loss:     Estimated blood loss: none. ?Procedure:                Pre-Anesthesia Assessment: ?                          - Prior to the procedure, a History and Physical  ?                          was performed, and patient medications and  ?                          allergies were reviewed. The patient's tolerance of  ?                          previous anesthesia was also reviewed. The risks  ?                          and benefits of the procedure and the sedation  ?                          options and risks were discussed with the patient.  ?                          All questions were answered, and informed consent  ?                          was obtained. Prior Anticoagulants: The patient has  ?  taken no previous anticoagulant or antiplatelet  ?                          agents. ASA Grade Assessment: III - A patient with  ?                          severe systemic disease. After reviewing the risks  ?                          and benefits, the patient was deemed in  ?                          satisfactory condition to undergo the procedure. ?                          - Prior to the procedure, a History and  Physical  ?                          was performed, and patient medications and  ?                          allergies were reviewed. The patient's tolerance of  ?                          previous anesthesia was also reviewed. The risks  ?                          and benefits of the procedure and the sedation  ?                          options and risks were discussed with the patient.  ?                          All questions were answered, and informed consent  ?                          was obtained. Prior Anticoagulants: The patient has  ?                          taken no previous anticoagulant or antiplatelet  ?                          agents. ASA Grade Assessment: II - A patient with  ?                          mild systemic disease. After reviewing the risks  ?                          and benefits, the patient was deemed in  ?                          satisfactory condition to undergo the procedure. ?  After obtaining informed consent, the endoscope was  ?                          passed under direct vision. Throughout the  ?                          procedure, the patient's blood pressure, pulse, and  ?                          oxygen saturations were monitored continuously. The  ?                          [id number]) Olympus endoscope was introduced  ?                          through the mouth, and advanced to the second part  ?                          of duodenum. The upper GI endoscopy was  ?                          accomplished without difficulty. The patient  ?                          tolerated the procedure well. ?Scope In: ?Scope Out: ?Findings: ?     No fresh or old blood in the UGI tract. ?     One non-bleeding cratered, 46mm, clean based gastric ulcer with no  ?     stigmata of bleeding was found in the gastric antrum. It was located  ?     about 3-4cm proximal to the antrum. The surrounding mucosa was  ?     edematous, erythematous but not overtly neoplastic  and it was biopsied  ?     for H. pylori. ?     A small hiatal hernia was present. ?     The examination was otherwise normal. ?Impression:               - Clean based distal gastric ulcer, low risk for  ?                          rebleeding. Biopsies taken from the surrounding  ?                          edematous mucosa to check for H. pylori, neoplasm  ?                          (which is unlikely). ?                          - Small hiatal hernia. ?                          Her daily NSAID likely have caused the ulcer, she  ?  needs to completely stop all NSAIDs and ASA (unless  ?                          specifically recommended to take ASA for  ?                          cardiovascular or neurologic reason). She is safe  ?                          for discharge later today, would be great if she  ?                          can have an infusion of IV iron before d/c. I will  ?                          follow up on the biopsies, will arrange follow up  ?                          office visit in 5-6 weeks with CBC the day prior.  ?                          She will eventually need repeat EGD in about 2  ?                          months to confirm ulcer healing. She should stay on  ?                          PPI twice daily for now as well as a single oral  ?                          iron supplement once daily. ?                          Signing off for now, will take care of all the  ?                          above follow up. Please call or page with any  ?                          further questions or concerns. ?Moderate Sedation: ?     Not Applicable - Patient had care per Anesthesia. ?Recommendation:           - See above. ?Procedure Code(s):        --- Professional --- ?                          [phone], Esophagogastroduodenoscopy, flexible,  ?                          transoral; with biopsy, single or multiple ?Diagnosis Code(s):        --- Professional --- ?  K25.9, Gastric ulcer, unspecified as acute or  ?                          chronic, without hemorrhage or perforation ?                          K44.9, Diaphragmatic hernia without obstruction or  ?                          gangrene ?                          K92.1, Melena (includes Hematochezia) ?CPT copyright 2019 American Medical Association. All rights reserved. ?The codes documented in this report are preliminary and upon coder review may  ?be revised to meet current compliance requirements. ?Milus Banister, MD ?12/17/2021 10:52:53 AM ?This report has been signed electronically. ?Number of Addenda: 0 ?

## 2021-12-17 NOTE — Plan of Care (Signed)

## 2021-12-17 NOTE — Discharge Summary (Signed)
Physician Discharge Summary   Patient: Julie [name] MRN: [id number] DOB: 1935/04/28  Admit date:     12/15/2021  Discharge date: 12/17/21  Discharge Physician: Elmarie Shiley   PCP: Janie Morning, DO   Recommendations at discharge:    Follow up gastric biopsy results.  Continue with PPI BID  Needs repeat Endoscopy in 2 months.    Discharge Diagnoses: Principal Problem:   Severe anemia Active Problems:   Essential hypertension   Hypothyroidism   Type 2 diabetes mellitus without complication, with long-term current use of insulin (HCC)   CKD stage G3a/A2, GFR 45-59 and albumin creatinine ratio 30-299 mg/g (HCC)   Gastric ulcer  Resolved Problems:   Normocytic anemia  Hospital Course: 87 year old past medical history significant for hypertension, diabetes type 2, hypothyroidism presented with abnormal lab work hemoglobin 6.9.  She report few weeks ago black stool that has resolved.  She reported history of abdominal pain in the left side that has resolved.  Patient admitted for further evaluation of iron deficiency anemia, require blood transfusion.  GI has been consulted. Underwent endoscopy showed gastric ulcer not actively bleeding. Plan to discharge to PPI BID> follow up with GI 2 months for repeat endoscopy.   Assessment and Plan: * Severe anemia -Presents with hemoglobin of 6.9. Iron deficiency.  -Prior hemoglobin per records 12, 5 years ago.  Per ED physician patient had hemoglobin previously at 10- one year ago. -Normal bilirubin, unlikely hemolysis. Normal platelet, and white count.  -Report Black stool weeks ago.  -Treated with  IV Protonix IV BID>  -received a total of  2 units PRBC.  -GI consulted. Underwent endoscopy/colonoscopy: gastric ulcer, diverticulosis, external /internal hemorrhoids.  -Hb stable. Received IV iron 3/12. -Discharge on PPI and iron supplement.   Gastric ulcer Diagnosed by endoscopy 3/12. Biopsy pending. Will be follow by GI.   Discharge on PPI BID.  Need repeat endoscopy in 2 months.   CKD stage G3a/A2, GFR 45-59 and albumin creatinine ratio 30-299 mg/g (HCC) Renal function stable. Cr 1.2.   Type 2 diabetes mellitus without complication, with long-term current use of insulin (HCC) Hold Metformin.  Monitor.   Hypothyroidism Continue with Synthroid  Essential hypertension Continue with Norvasc and carvedilol  Normocytic anemia-resolved as of 12/15/2021            Consultants: GI Procedures performed:  Disposition: Home Diet recommendation:  Discharge Diet Orders (From admission, onward)     Start     Ordered   12/17/21 0000  Diet - low sodium heart healthy        12/17/21 1216           Cardiac diet DISCHARGE MEDICATION: Allergies as of 12/17/2021       Reactions   Ace Inhibitors Swelling, Other (See Comments)   Angioedemia   Amrix [cyclobenzaprine Hcl] Other (See Comments)   Abnormal LFT(s)   Aspirin Other (See Comments)   Bleeding    Fluticasone-salmeterol Other (See Comments)   Blurred vision   Nsaids Other (See Comments)   Bleeding   Pregabalin Other (See Comments)   Patient did not want to experience possible side effects   Zocor [simvastatin] Other (See Comments)   Muscle pain   Nifedipine Anxiety, Other (See Comments)   Nervousness        Medication List     STOP taking these medications    aspirin 81 MG tablet   gabapentin 100 MG capsule Commonly known as: Neurontin   indomethacin 25 MG capsule  Commonly known as: INDOCIN   meclizine 25 MG tablet Commonly known as: ANTIVERT   predniSONE 5 MG tablet Commonly known as: DELTASONE       TAKE these medications    acetaminophen 650 MG CR tablet Commonly known as: TYLENOL Take 650 mg by mouth 2 (two) times daily as needed for pain.   amLODipine 10 MG tablet Commonly known as: NORVASC Take 10 mg by mouth daily.   carvedilol 3.125 MG tablet Commonly known as: COREG Take 3.125 mg by mouth 2  (two) times daily.   ferrous sulfate 325 (65 FE) MG tablet Take 1 tablet (325 mg total) by mouth daily.   levothyroxine 50 MCG tablet Commonly known as: SYNTHROID Take 50 mcg by mouth every Sunday.   LORazepam 1 MG tablet Commonly known as: ATIVAN Take 1 mg by mouth 3 (three) times daily as needed (for vertigo).   ondansetron 8 MG tablet Commonly known as: ZOFRAN Take 8 mg by mouth every 8 (eight) hours as needed for nausea or vomiting.   pantoprazole 40 MG tablet Commonly known as: Protonix Take 1 tablet (40 mg total) by mouth 2 (two) times daily.   tiZANidine 4 MG tablet Commonly known as: ZANAFLEX Take 4 mg by mouth every 6 (six) hours as needed for muscle spasms.   traMADol 50 MG tablet Commonly known as: ULTRAM Take 50-100 mg by mouth 2 (two) times daily as needed for moderate pain. Maximum dose= 8 tablets per day pain   Vitamin D-3 25 MCG (1000 UT) Caps Take 1,000 Units by mouth 3 (three) times a week.        Follow-up Information     Janie Morning, DO Follow up in 1 week(s).   Specialty: Family Medicine Contact information: [address] [place] [address] [phone]         Milus Banister, MD Follow up in 2 month(s).   Specialty: Gastroenterology Contact information: [address] [place] [address] [phone]                Discharge Exam: Danley Danker Weights   12/15/21 1415 12/17/21 0908  Weight: 74.2 kg 74.2 kg   General; NAD Lungs; CTA Abdomen; soft, nt , nd   Condition at discharge: stable  The results of significant diagnostics from this hospitalization (including imaging, microbiology, ancillary and laboratory) are listed below for reference.   Imaging Studies: No results found.  Microbiology: Results for orders placed or performed during the hospital encounter of 12/15/21  Resp Panel by RT-PCR (Flu A&B, Covid) Nasopharyngeal Swab     Status: None   Collection Time: 12/15/21  4:19 PM   Specimen:  Nasopharyngeal Swab; Nasopharyngeal(NP) swabs in vial transport medium  Result Value Ref Range Status   SARS Coronavirus 2 by RT PCR NEGATIVE NEGATIVE Final    Comment: (NOTE) SARS-CoV-2 target nucleic acids are NOT DETECTED.  The SARS-CoV-2 RNA is generally detectable in upper respiratory specimens during the acute phase of infection. The lowest concentration of SARS-CoV-2 viral copies this assay can detect is 138 copies/mL. A negative result does not preclude SARS-Cov-2 infection and should not be used as the sole basis for treatment or other patient management decisions. A negative result may occur with  improper specimen collection/handling, submission of specimen other than nasopharyngeal swab, presence of viral mutation(s) within the areas targeted by this assay, and inadequate number of viral copies(<138 copies/mL). A negative result must be combined with clinical observations, patient history, and epidemiological information. The  expected result is Negative.  Fact Sheet for Patients:  EntrepreneurPulse.com.au  Fact Sheet for Healthcare Providers:  IncredibleEmployment.be  This test is no t yet approved or cleared by the [place] FDA and  has been authorized for detection and/or diagnosis of SARS-CoV-2 by FDA under an Emergency Use Authorization (EUA). This EUA will remain  in effect (meaning this test can be used) for the duration of the COVID-19 declaration under Section 564(b)(1) of the Act, 21 U.S.C.section 360bbb-3(b)(1), unless the authorization is terminated  or revoked sooner.       Influenza A by PCR NEGATIVE NEGATIVE Final   Influenza B by PCR NEGATIVE NEGATIVE Final    Comment: (NOTE) The Xpert Xpress SARS-CoV-2/FLU/RSV plus assay is intended as an aid in the diagnosis of influenza from Nasopharyngeal swab specimens and should not be used as a sole basis for treatment. Nasal washings and aspirates are unacceptable for  Xpert Xpress SARS-CoV-2/FLU/RSV testing.  Fact Sheet for Patients: EntrepreneurPulse.com.au  Fact Sheet for Healthcare Providers: IncredibleEmployment.be  This test is not yet approved or cleared by the [place] FDA and has been authorized for detection and/or diagnosis of SARS-CoV-2 by FDA under an Emergency Use Authorization (EUA). This EUA will remain in effect (meaning this test can be used) for the duration of the COVID-19 declaration under Section 564(b)(1) of the Act, 21 U.S.C. section 360bbb-3(b)(1), unless the authorization is terminated or revoked.  Performed at [facility], [place] [address] [place], [place] [address]     Labs: CBC: Recent Labs  Lab 12/15/21 1422 12/16/21 1411 12/17/21 0801  WBC 6.5 5.3 6.9  NEUTROABS 4.9 2.8  --   HGB 6.9* 9.0* 10.5*  HCT 22.8* 28.0* 32.9*  MCV 75.0* 78.2* 78.3*  PLT 306 257 [id number]   Basic Metabolic Panel: Recent Labs  Lab 12/15/21 1422 12/16/21 1411  NA 139 139  K 4.8 4.4  CL 110 111  CO2 23 22  GLUCOSE 109* 97  BUN 26* 17  CREATININE 1.29* 1.28*  CALCIUM 9.0 8.6*   Liver Function Tests: Recent Labs  Lab 12/15/21 1422  AST 25  ALT 13  ALKPHOS 85  BILITOT 0.3  PROT 7.2  ALBUMIN 3.7   CBG: No results for input(s): GLUCAP in the last 168 hours.  Discharge time spent: greater than 30 minutes.  Signed: Elmarie Shiley, MD [facility] 12/17/2021

## 2021-12-17 NOTE — Anesthesia Preprocedure Evaluation (Addendum)
Anesthesia Evaluation  ?Patient identified by MRN, date of birth, ID band ?Patient awake ? ? ? ?Reviewed: ?Allergy & Precautions, NPO status , Patient's Chart, lab work & pertinent test results, reviewed documented beta blocker date and time  ? ?Airway ?Mallampati: III ? ?TM Distance: >3 FB ?Neck ROM: Full ? ? ? Dental ? ?(+) Missing, Chipped, Dental Advisory Given,  ?  ?Pulmonary ?asthma , former smoker,  ?  ?Pulmonary exam normal ?breath sounds clear to auscultation ? ? ? ? ? ? Cardiovascular ?hypertension, Pt. on home beta blockers and Pt. on medications ?negative cardio ROS ?Normal cardiovascular exam ?Rhythm:Regular Rate:Normal ? ? ?  ?Neuro/Psych ?negative neurological ROS ? negative psych ROS  ? GI/Hepatic ?negative GI ROS, Neg liver ROS,   ?Endo/Other  ?diabetesHypothyroidism  ? Renal/GU ?Renal InsufficiencyRenal disease  ?negative genitourinary ?  ?Musculoskeletal ?negative musculoskeletal ROS ?(+)  ? Abdominal ?  ?Peds ? Hematology ? ?(+) Blood dyscrasia, anemia , Hgb 6.9-->9.0 s/p 2U RBCs   ?Anesthesia Other Findings ? ? Reproductive/Obstetrics ? ?  ? ? ? ? ? ? ? ? ? ? ? ? ? ?  ?  ? ? ? ? ? ? ? ?Anesthesia Physical ?Anesthesia Plan ? ?ASA: 3 ? ?Anesthesia Plan: MAC  ? ?Post-op Pain Management:   ? ?Induction: Intravenous ? ?PONV Risk Score and Plan: Propofol infusion and Treatment may vary due to age or medical condition ? ?Airway Management Planned: Natural Airway ? ?Additional Equipment:  ? ?Intra-op Plan:  ? ?Post-operative Plan:  ? ?Informed Consent: I have reviewed the patients History and Physical, chart, labs and discussed the procedure including the risks, benefits and alternatives for the proposed anesthesia with the patient or authorized representative who has indicated his/her understanding and acceptance.  ? ? ? ?Dental advisory given ? ?Plan Discussed with: CRNA ? ?Anesthesia Plan Comments:   ? ? ? ? ? ? ?Anesthesia Quick Evaluation ? ?

## 2021-12-17 NOTE — Care Plan (Signed)
Patient left via wheelchair in stable condition in the care of Shawna Clamp (patients care giver) ?Discharge instructions given to patient, all questions/concerns answered. ? ?Farrel Gobble, RN, BSN ? ?

## 2021-12-17 NOTE — Assessment & Plan Note (Signed)
Diagnosed by endoscopy 3/12. Biopsy pending. Will be follow by GI.  ?Discharge on PPI BID.  ?Need repeat endoscopy in 2 months.  ?

## 2021-12-18 ENCOUNTER — Encounter (HOSPITAL_COMMUNITY): Payer: Self-pay | Admitting: Gastroenterology

## 2021-12-21 ENCOUNTER — Telehealth: Payer: Self-pay | Admitting: Gastroenterology

## 2021-12-21 LAB — SURGICAL PATHOLOGY

## 2021-12-21 NOTE — Telephone Encounter (Signed)
Noted the pt will follow up with PCP ?

## 2021-12-21 NOTE — Telephone Encounter (Signed)
Inbound call from patient states she had colonoscopy Sunday. States since starting Pantoprazole she is experiencing swollen  left ankles, foot ankles, wrist ?

## 2021-12-21 NOTE — Telephone Encounter (Signed)
The pt states she was put on pantoprazole for gastric ulcer on Sunday and today she woke up with swollen foot and ankle.  She wants to see if the pantoprazole could be the cause of the edema of the left foot and ankle.  She also has a call to her PCP to discuss. She is urinating normally.  She also states she has a history of gout.  She will elevate the left foot and follow the instructions per her PCP.  Any further recommendations?   ?

## 2021-12-22 ENCOUNTER — Other Ambulatory Visit: Payer: Self-pay

## 2021-12-22 ENCOUNTER — Other Ambulatory Visit: Payer: Self-pay | Admitting: Gastroenterology

## 2021-12-22 MED ORDER — AMOXICILLIN 500 MG PO TABS: 1000.0000 mg | ORAL_TABLET | Freq: Two times a day (BID) | ORAL | 0 refills | Status: AC

## 2021-12-22 MED ORDER — PYLERA 140-125-125 MG PO CAPS: 3.0000 | ORAL_CAPSULE | Freq: Three times a day (TID) | ORAL | 0 refills | Status: DC

## 2021-12-22 MED ORDER — CLARITHROMYCIN 500 MG PO TABS
500.0000 mg | ORAL_TABLET | Freq: Two times a day (BID) | ORAL | 0 refills | Status: AC
Start: 2021-12-22 — End: 2022-01-01

## 2021-12-22 MED ORDER — METRONIDAZOLE 500 MG PO TABS: 500.0000 mg | ORAL_TABLET | Freq: Two times a day (BID) | ORAL | 0 refills | Status: AC

## 2022-01-01 ENCOUNTER — Telehealth: Payer: Self-pay | Admitting: Gastroenterology

## 2022-01-01 NOTE — Telephone Encounter (Signed)
The pt called to confirm that she is suppose to be on 3 different abx for H pylori.  I did confirm with her that she should take all three as prescribed.  She thanked me for calling her back verbalized understanding  ?

## 2022-01-01 NOTE — Telephone Encounter (Signed)
Inbound call from patient stating that she has questions about 3 medications that Dr. Christella Hartigan give her. Please advise.  ?

## 2022-01-16 ENCOUNTER — Telehealth: Payer: Self-pay | Admitting: Gastroenterology

## 2022-01-16 NOTE — Telephone Encounter (Signed)
The pt has been advised that she is to take her pantoprazole twice daily to heal the ulcer that was seen on EGD.  She agrees and states she is taking as prescribed and wanted to confirm that is the PPI she was told to take.  The pt has been advised of the information and verbalized understanding.    ?

## 2022-01-16 NOTE — Telephone Encounter (Signed)
Inbound call from patient stating that she had read on her paperwork that she needs to continue her "PPI" medication and patient is not sure what medication that is. Please advise.  ?

## 2022-02-04 ENCOUNTER — Emergency Department (HOSPITAL_COMMUNITY)
Admission: EM | Admit: 2022-02-04 | Discharge: 2022-02-04 | Disposition: A | Discharge status: Home / Self Care | Payer: Medicare Other | Attending: Emergency Medicine | Admitting: Emergency Medicine

## 2022-02-04 ENCOUNTER — Other Ambulatory Visit: Payer: Self-pay

## 2022-02-04 ENCOUNTER — Encounter (HOSPITAL_COMMUNITY): Payer: Self-pay

## 2022-02-04 ENCOUNTER — Emergency Department (HOSPITAL_COMMUNITY): Payer: Medicare Other

## 2022-02-04 DIAGNOSIS — K921 Melena: Secondary | ICD-10-CM | POA: Diagnosis not present

## 2022-02-04 DIAGNOSIS — R1032 Left lower quadrant pain: Secondary | ICD-10-CM | POA: Insufficient documentation

## 2022-02-04 DIAGNOSIS — N189 Chronic kidney disease, unspecified: Secondary | ICD-10-CM | POA: Diagnosis not present

## 2022-02-04 DIAGNOSIS — K8080 Other cholelithiasis without obstruction: Secondary | ICD-10-CM | POA: Diagnosis not present

## 2022-02-04 DIAGNOSIS — Z79899 Other long term (current) drug therapy: Secondary | ICD-10-CM | POA: Insufficient documentation

## 2022-02-04 DIAGNOSIS — I10 Essential (primary) hypertension: Secondary | ICD-10-CM | POA: Diagnosis not present

## 2022-02-04 DIAGNOSIS — K7689 Other specified diseases of liver: Secondary | ICD-10-CM | POA: Diagnosis not present

## 2022-02-04 DIAGNOSIS — E039 Hypothyroidism, unspecified: Secondary | ICD-10-CM | POA: Diagnosis not present

## 2022-02-04 DIAGNOSIS — R1012 Left upper quadrant pain: Secondary | ICD-10-CM | POA: Diagnosis not present

## 2022-02-04 DIAGNOSIS — N281 Cyst of kidney, acquired: Secondary | ICD-10-CM | POA: Diagnosis not present

## 2022-02-04 DIAGNOSIS — Z87891 Personal history of nicotine dependence: Secondary | ICD-10-CM | POA: Diagnosis not present

## 2022-02-04 DIAGNOSIS — R109 Unspecified abdominal pain: Secondary | ICD-10-CM

## 2022-02-04 DIAGNOSIS — I129 Hypertensive chronic kidney disease with stage 1 through stage 4 chronic kidney disease, or unspecified chronic kidney disease: Secondary | ICD-10-CM | POA: Insufficient documentation

## 2022-02-04 DIAGNOSIS — J45909 Unspecified asthma, uncomplicated: Secondary | ICD-10-CM | POA: Insufficient documentation

## 2022-02-04 DIAGNOSIS — K8020 Calculus of gallbladder without cholecystitis without obstruction: Secondary | ICD-10-CM | POA: Diagnosis not present

## 2022-02-04 DIAGNOSIS — M47816 Spondylosis without myelopathy or radiculopathy, lumbar region: Secondary | ICD-10-CM | POA: Diagnosis not present

## 2022-02-04 LAB — COMPREHENSIVE METABOLIC PANEL
ALT: 14 U/L (ref 0–44)
AST: 27 U/L (ref 15–41)
Albumin: 4 g/dL (ref 3.5–5.0)
Alkaline Phosphatase: 87 U/L (ref 38–126)
Anion gap: 9 (ref 5–15)
BUN: 18 mg/dL (ref 8–23)
CO2: 22 mmol/L (ref 22–32)
Calcium: 9.4 mg/dL (ref 8.9–10.3)
Chloride: 109 mmol/L (ref 98–111)
Creatinine, Ser: 1.39 mg/dL — ABNORMAL HIGH (ref 0.44–1.00)
GFR, Estimated: 37 mL/min — ABNORMAL LOW (ref >60)
Glucose, Bld: 119 mg/dL — ABNORMAL HIGH (ref 70–99)
Potassium: 4 mmol/L (ref 3.5–5.1)
Sodium: 140 mmol/L (ref 135–145)
Total Bilirubin: 0.5 mg/dL (ref 0.3–1.2)
Total Protein: 7.2 g/dL (ref 6.5–8.1)

## 2022-02-04 LAB — CBC WITH DIFFERENTIAL/PLATELET
Abs Immature Granulocytes: 0.01 10*3/uL (ref 0.00–0.07)
Basophils Absolute: 0.1 10*3/uL (ref 0.0–0.1)
Basophils Relative: 1 %
Eosinophils Absolute: 0 10*3/uL (ref 0.0–0.5)
Eosinophils Relative: 1 %
HCT: 38.8 % (ref 36.0–46.0)
Hemoglobin: 12.9 g/dL (ref 12.0–15.0)
Immature Granulocytes: 0 %
Lymphocytes Relative: 27 %
Lymphs Abs: 1.2 10*3/uL (ref 0.7–4.0)
MCH: 27 pg (ref 26.0–34.0)
MCHC: 33.2 g/dL (ref 30.0–36.0)
MCV: 81.2 fL (ref 80.0–100.0)
Monocytes Absolute: 0.4 10*3/uL (ref 0.1–1.0)
Monocytes Relative: 9 %
Neutro Abs: 2.7 10*3/uL (ref 1.7–7.7)
Neutrophils Relative %: 62 %
Platelets: 226 10*3/uL (ref 150–400)
RBC: 4.78 MIL/uL (ref 3.87–5.11)
RDW: 27 % — ABNORMAL HIGH (ref 11.5–15.5)
WBC: 4.5 10*3/uL (ref 4.0–10.5)
nRBC: 0 % (ref 0.0–0.2)

## 2022-02-04 LAB — URINALYSIS, ROUTINE W REFLEX MICROSCOPIC
Bilirubin Urine: NEGATIVE
Glucose, UA: NEGATIVE mg/dL
Hgb urine dipstick: NEGATIVE
Ketones, ur: NEGATIVE mg/dL
Leukocytes,Ua: NEGATIVE
Nitrite: NEGATIVE
Protein, ur: NEGATIVE mg/dL
Specific Gravity, Urine: 1.006 (ref 1.005–1.030)
pH: 5 (ref 5.0–8.0)

## 2022-02-04 LAB — LIPASE, BLOOD: Lipase: 40 U/L (ref 11–51)

## 2022-02-04 MED ORDER — DICYCLOMINE HCL 20 MG PO TABS: 10.0000 mg | ORAL_TABLET | Freq: Two times a day (BID) | ORAL | 0 refills | Status: DC | PRN

## 2022-02-04 MED ORDER — ALUM & MAG HYDROXIDE-SIMETH 200-200-20 MG/5ML PO SUSP
30.0000 mL | Freq: Once | ORAL | Status: AC
  Administered 2022-02-04: 30 mL via ORAL
  Filled 2022-02-04: qty 30

## 2022-02-04 MED ORDER — FAMOTIDINE IN NACL 20-0.9 MG/50ML-% IV SOLN
20.0000 mg | Freq: Once | INTRAVENOUS | Status: AC
  Administered 2022-02-04: 20 mg via INTRAVENOUS
  Filled 2022-02-04: qty 50

## 2022-02-04 MED ORDER — SODIUM CHLORIDE 0.9 % IV BOLUS
500.0000 mL | Freq: Once | INTRAVENOUS | Status: AC
  Administered 2022-02-04: 500 mL via INTRAVENOUS

## 2022-02-04 MED ORDER — MORPHINE SULFATE (PF) 2 MG/ML IV SOLN
2.0000 mg | Freq: Once | INTRAVENOUS | Status: AC
  Administered 2022-02-04: 2 mg via INTRAVENOUS
  Filled 2022-02-04: qty 1

## 2022-02-04 MED ORDER — SUCRALFATE 1 G PO TABS: 1.0000 g | ORAL_TABLET | Freq: Three times a day (TID) | ORAL | 0 refills | Status: DC

## 2022-02-04 MED ORDER — LIDOCAINE VISCOUS HCL 2 % MT SOLN
15.0000 mL | Freq: Once | OROMUCOSAL | Status: AC
  Administered 2022-02-04: 15 mL via ORAL
  Filled 2022-02-04: qty 15

## 2022-02-04 MED ORDER — DICYCLOMINE HCL 10 MG PO CAPS
10.0000 mg | ORAL_CAPSULE | Freq: Once | ORAL | Status: AC
  Administered 2022-02-04: 10 mg via ORAL
  Filled 2022-02-04: qty 1

## 2022-02-04 MED ORDER — ONDANSETRON HCL 4 MG/2ML IJ SOLN
4.0000 mg | Freq: Once | INTRAMUSCULAR | Status: AC
  Administered 2022-02-04: 4 mg via INTRAVENOUS
  Filled 2022-02-04: qty 2

## 2022-02-04 NOTE — ED Notes (Signed)
Patient niece is finally answer her phone and she is on her way to pick the patient up.  ?

## 2022-02-04 NOTE — ED Notes (Signed)
PT is on perwick ?

## 2022-02-04 NOTE — Discharge Instructions (Addendum)
It was a pleasure caring for you today in the emergency department. ? ?Please follow-up with your gastroenterologist ? ?Please return to the emergency department for any worsening or worrisome symptoms. ? ?

## 2022-02-04 NOTE — ED Triage Notes (Signed)
Patient presented to the ed with c/o left flank pain started yesterday. Patient state she took tylenol yesterday without any relief. Patient rating her pain at 10/10. ?

## 2022-02-04 NOTE — ED Provider Notes (Signed)
?[facility]-EMERGENCY DEPT ?Provider Note ? ? ?CSN: [id number] ?Arrival date & time: 02/04/22  1114 ? ?  ? ?History ? ?No chief complaint on file. ? ? ?Julie [name] is a 87 y.o. female. ? ? Patient as above with significant medical history as below, including asthma, hypertension, CKD, gastric ulcer who presents to the ED with complaint of left-sided flank pain.  Dark stool. ? ?Patient was recently admitted secondary to ulcer, received evaluation by gastroenterology w/ c-scope, started on PPI. Dr Christella Hartigan.  Patient reports over the past few days she has had worsening pain to her left flank.  No nausea or vomiting.  She has intermittent malaise.  No change urination.  Stool has been dark.  Number of blood per rectum.  No chest pain or dyspnea.  No trauma.  She has been compliant with her Protonix ? ? ? ? ? ?Past Medical History: ?No date: Asthma ?No date: Hypertension ? ?Past Surgical History: ?12/17/2021: BIOPSY ?    Comment:  Procedure: BIOPSY;  Surgeon: Rachael Fee, MD;   ?             Location: [facility];  Service: Gastroenterology;; ?12/17/2021: COLONOSCOPY WITH PROPOFOL; N/A ?    Comment:  Procedure: COLONOSCOPY WITH PROPOFOL;  Surgeon: Christella Hartigan,  ?             Melton Alar, MD;  Location: [facility];  Service:  ?             Gastroenterology;  Laterality: N/A; ?12/17/2021: ESOPHAGOGASTRODUODENOSCOPY (EGD) WITH PROPOFOL; N/A ?    Comment:  Procedure: ESOPHAGOGASTRODUODENOSCOPY (EGD) WITH  ?             PROPOFOL;  Surgeon: Rachael Fee, MD;  Location: [facility]  ?             ENDOSCOPY;  Service: Gastroenterology;  Laterality: N/A; ?No date: foot surgery ?No date: ovarina cyst removal ?    Comment:  ovarian cyst removal  ? ? ?The history is provided by the patient and a relative. No language interpreter was used.  ? ?  ? ?Home Medications ?Prior to Admission medications   ?Medication Sig Start Date End Date Taking? Authorizing Provider  ?acetaminophen (TYLENOL) 650 MG CR tablet Take 650 mg by  mouth 2 (two) times daily as needed for pain.   Yes [provider]  ?amLODipine (NORVASC) 10 MG tablet Take 10 mg by mouth daily.   Yes [provider]  ?carvedilol (COREG) 3.125 MG tablet Take 3.125 mg by mouth 2 (two) times daily. 03/15/21  Yes [provider]  ?Cholecalciferol (VITAMIN D-3) 25 MCG (1000 UT) CAPS Take 1,000 Units by mouth 3 (three) times a week.   Yes [provider]  ?colchicine 0.6 MG tablet Take 0.6 mg by mouth daily.   Yes [provider]  ?Coumarin POWD by Does not apply route.   Yes [provider]  ?dicyclomine (BENTYL) 20 MG tablet Take 0.5 tablets (10 mg total) by mouth 2 (two) times daily as needed for spasms. 02/04/22  Yes Sloan Leiter, DO  ?levothyroxine (SYNTHROID, LEVOTHROID) 50 MCG tablet Take 50 mcg by mouth daily before breakfast.   Yes [provider]  ?LORazepam (ATIVAN) 1 MG tablet Take 1 mg by mouth 3 (three) times daily as needed (for vertigo).   Yes [provider]  ?Omega-3 Fatty Acids (FISH OIL PO) Take by mouth.   Yes [provider]  ?ondansetron (ZOFRAN) 8 MG tablet  Take 8 mg by mouth every 8 (eight) hours as needed for nausea or vomiting.   Yes [provider]  ?pantoprazole (PROTONIX) 40 MG tablet Take 1 tablet (40 mg total) by mouth 2 (two) times daily. 12/17/21 02/15/22 Yes Regalado, Belkys A, MD  ?sucralfate (CARAFATE) 1 g tablet Take 1 tablet (1 g total) by mouth 4 (four) times daily -  with meals and at bedtime for 7 days. 02/04/22 02/11/22 Yes Sloan LeiterGray, Genavieve Mangiapane A, DO  ?tiZANidine (ZANAFLEX) 4 MG tablet Take 4 mg by mouth every 6 (six) hours as needed for muscle spasms.   Yes [provider]  ?traMADol (ULTRAM) 50 MG tablet Take 50-100 mg by mouth 2 (two) times daily as needed for moderate pain. Maximum dose= 8 tablets per day pain   Yes [provider]  ?ferrous sulfate 325 (65 FE) MG tablet Take 1 tablet (325 mg total) by mouth daily. ?Patient not taking: Reported  on 02/04/2022 12/17/21 12/17/22  Alba Coryegalado, Belkys A, MD  ?   ? ?Allergies    ?Ace inhibitors, Amrix [cyclobenzaprine hcl], Aspirin, Fluticasone-salmeterol, Nsaids, Pregabalin, Zocor [simvastatin], and Nifedipine   ? ?Review of Systems   ?Review of Systems  ?Constitutional:  Negative for chills and fever.  ?HENT:  Negative for facial swelling and trouble swallowing.   ?Eyes:  Negative for photophobia and visual disturbance.  ?Respiratory:  Negative for cough and shortness of breath.   ?Cardiovascular:  Negative for chest pain and palpitations.  ?Gastrointestinal:  Positive for abdominal pain. Negative for constipation, diarrhea, nausea and vomiting.  ?     Dark stool  ?Endocrine: Negative for polydipsia and polyuria.  ?Genitourinary:  Negative for difficulty urinating and hematuria.  ?Musculoskeletal:  Negative for gait problem and joint swelling.  ?Skin:  Negative for pallor and rash.  ?Neurological:  Negative for syncope and headaches.  ?Psychiatric/Behavioral:  Negative for agitation and confusion.   ? ?Physical Exam ?Updated Vital Signs ?BP (!) 154/95   Pulse 93   Temp 98.2 ?F (36.8 ?C)   Resp 16   Ht 5\' 5"  (1.651 m)   Wt 73.5 kg   SpO2 96%   BMI 26.96 kg/m?  ?Physical Exam ?Vitals and nursing note reviewed.  ?Constitutional:   ?   General: She is not in acute distress. ?   Appearance: Normal appearance.  ?HENT:  ?   Head: Normocephalic and atraumatic.  ?   Right Ear: External ear normal.  ?   Left Ear: External ear normal.  ?   Nose: Nose normal.  ?   Mouth/Throat:  ?   Mouth: Mucous membranes are moist.  ?Eyes:  ?   General: No scleral icterus.    ?   Right eye: No discharge.     ?   Left eye: No discharge.  ?Cardiovascular:  ?   Rate and Rhythm: Normal rate and regular rhythm.  ?   Pulses: Normal pulses.  ?   Heart sounds: Normal heart sounds.  ?Pulmonary:  ?   Effort: Pulmonary effort is normal. No respiratory distress.  ?   Breath sounds: Normal breath sounds.  ?Abdominal:  ?   General: Abdomen is flat.   ?   Palpations: Abdomen is soft.  ?   Tenderness: There is abdominal tenderness in the left upper quadrant and left lower quadrant. There is no guarding or rebound.  ? ? ?Musculoskeletal:     ?   General: Normal range of motion.  ?   Cervical back: Normal range of  motion.  ?   Right lower leg: No edema.  ?   Left lower leg: No edema.  ?Skin: ?   General: Skin is warm and dry.  ?   Capillary Refill: Capillary refill takes less than 2 seconds.  ?Neurological:  ?   Mental Status: She is alert.  ?Psychiatric:     ?   Mood and Affect: Mood normal.     ?   Behavior: Behavior normal.  ? ? ?ED Results / Procedures / Treatments   ?Labs ?(all labs ordered are listed, but only abnormal results are displayed) ?Labs Reviewed  ?CBC WITH DIFFERENTIAL/PLATELET - Abnormal; Notable for the following components:  ?    Result Value  ? RDW 27.0 (*)   ? All other components within normal limits  ?COMPREHENSIVE METABOLIC PANEL - Abnormal; Notable for the following components:  ? Glucose, Bld 119 (*)   ? Creatinine, Ser 1.39 (*)   ? GFR, Estimated 37 (*)   ? All other components within normal limits  ?URINALYSIS, ROUTINE W REFLEX MICROSCOPIC - Abnormal; Notable for the following components:  ? Color, Urine STRAW (*)   ? All other components within normal limits  ?LIPASE, BLOOD  ? ? ?EKG ?EKG Interpretation ? ?Date/Time:  Sunday February 04 2022 11:41:35 EDT ?Ventricular Rate:  70 ?PR Interval:  60 ?QRS Duration: 89 ?QT Interval:  417 ?QTC Calculation: 450 ?R Axis:   -46 ?Text Interpretation: Sinus rhythm Short PR interval LAD, consider left anterior fascicular block Abnormal R-wave progression, early transition Interpretation limited secondary to artifact Confirmed by Tanda Rockers ([id number]) on 02/04/2022 1:47:05 PM ? ?Radiology ?No results found. ? ?Procedures ?Procedures  ? ? ?Medications Ordered in ED ?Medications  ?sodium chloride 0.9 % bolus 500 mL (0 mLs Intravenous Stopped 02/04/22 1522)  ?famotidine (PEPCID) IVPB 20 mg premix (0 mg  Intravenous Stopped 02/04/22 1351)  ?alum & mag hydroxide-simeth (MAALOX/MYLANTA) 200-200-20 MG/5ML suspension 30 mL (30 mLs Oral Given 02/04/22 1318)  ?  And  ?lidocaine (XYLOCAINE) 2 % viscous mouth solution 15 mL (15

## 2022-02-04 NOTE — ED Notes (Signed)
The patient has been pending discharged because the patient's nieces has the patient keys to the house, and we were unable to get a hold of the patient's niece. ?

## 2022-02-13 ENCOUNTER — Ambulatory Visit (INDEPENDENT_AMBULATORY_CARE_PROVIDER_SITE_OTHER): Payer: Medicare Other | Admitting: Gastroenterology

## 2022-02-13 ENCOUNTER — Encounter: Payer: Self-pay | Admitting: Gastroenterology

## 2022-02-13 VITALS — BP 114/62 | HR 91 | Ht 63.0 in | Wt 144.1 lb

## 2022-02-13 DIAGNOSIS — K259 Gastric ulcer, unspecified as acute or chronic, without hemorrhage or perforation: Secondary | ICD-10-CM | POA: Diagnosis not present

## 2022-02-13 NOTE — Patient Instructions (Signed)
If you are age 85 or older, your body mass index should be between 23-30. Your Body mass index is 25.53 kg/m?Marland Kitchen If this is out of the aforementioned range listed, please consider follow up with your Primary Care Provider. ?________________________________________________________ ? ?The [facility] providers would like to encourage you to use [facility] to communicate with providers for non-urgent requests or questions.  Due to long hold times on the telephone, sending your provider a message by [facility] may be a faster and more efficient way to get a response.  Please allow 48 business hours for a response.  Please remember that this is for non-urgent requests.  ?_______________________________________________________ ? ?You have been scheduled for an endoscopy. Please follow written instructions given to you at your visit today. ?If you use inhalers (even only as needed), please bring them with you on the day of your procedure. ? ?Due to recent changes in healthcare laws, you may see the results of your imaging and laboratory studies on MyChart before your provider has had a chance to review them.  We understand that in some cases there may be results that are confusing or concerning to you. Not all laboratory results come back in the same time frame and the provider may be waiting for multiple results in order to interpret others.  Please give [place] 48 hours in order for your provider to thoroughly review all the results before contacting the office for clarification of your results.  ? ?Thank you for entrusting me with your care and choosing [facility]. ? ?Dr Christella Hartigan ? ?

## 2022-02-13 NOTE — Progress Notes (Signed)
Review of pertinent gastrointestinal problems: ?1.  Severe iron deficiency anemia hemoglobin 6.14 December 2021 led to GI testing.  Colonoscopy 12/2021 Dr. Christella Hartigan found diverticulosis and hemorrhoids but was otherwise normal.  EGD 12/2021 found clean-based antral ulcer, biopsies positive for H. pylori.  Etiology likely H. pylori plus also NSAIDs.  She was put on correct antibiotics for eradication.  While in the hospital she received 1 or 2 units of blood and an IV iron infusion, she was instructed to take proton pump inhibitor twice daily and an iron supplement once daily at discharge. ? ? ?HPI: ?This is a very pleasant 87 year old woman whom I last saw when she was hospitalized about 2 months ago for a bleeding gastric ulcer. ? ?Blood work 2 weeks ago showed her hemoglobin has completely returned to normal at 12.9. ? ?She continues to intermittently have some abdominal pains, usually these are improved with moving her bowels and so I doubt they are related to her ulcer. ? ?She is quite clear that she took all of the antibiotics that were recommended for the H. pylori infection and she is still on proton pump inhibitor twice daily now. ? ?She intermittently has dark stools ? ?ROS: complete GI ROS as described in HPI, all other review negative. ? ?Constitutional:  No unintentional weight loss ? ? ?Past Medical History:  ?Diagnosis Date  ? Asthma   ? Hypertension   ? ? ?Past Surgical History:  ?Procedure Laterality Date  ? BIOPSY  12/17/2021  ? Procedure: BIOPSY;  Surgeon: Rachael Fee, MD;  Location: [facility];  Service: Gastroenterology;;  ? COLONOSCOPY WITH PROPOFOL N/A 12/17/2021  ? Procedure: COLONOSCOPY WITH PROPOFOL;  Surgeon: Rachael Fee, MD;  Location: [facility];  Service: Gastroenterology;  Laterality: N/A;  ? ESOPHAGOGASTRODUODENOSCOPY (EGD) WITH PROPOFOL N/A 12/17/2021  ? Procedure: ESOPHAGOGASTRODUODENOSCOPY (EGD) WITH PROPOFOL;  Surgeon: Rachael Fee, MD;  Location: [facility] ENDOSCOPY;  Service:  Gastroenterology;  Laterality: N/A;  ? foot surgery    ? ovarina cyst removal    ? ovarian cyst removal  ? ? ?Current Outpatient Medications  ?Medication Instructions  ? acetaminophen (TYLENOL) 650 mg, Oral, 2 times daily PRN  ? amLODipine (NORVASC) 10 mg, Daily  ? carvedilol (COREG) 3.125 mg, Oral, 2 times daily  ? colchicine 0.6 mg, Oral, Daily  ? Coumarin POWD Does not apply  ? dicyclomine (BENTYL) 10 mg, Oral, 2 times daily PRN  ? ferrous sulfate 325 mg, Oral, Daily  ? levothyroxine (SYNTHROID) 50 mcg, Oral, Daily before breakfast  ? LORazepam (ATIVAN) 1 mg, Oral, 3 times daily PRN  ? Omega-3 Fatty Acids (FISH OIL PO) Oral  ? ondansetron (ZOFRAN) 8 mg, Oral, Every 8 hours PRN  ? pantoprazole (PROTONIX) 40 mg, Oral, 2 times daily  ? sucralfate (CARAFATE) 1 g, Oral, 3 times daily with meals & bedtime  ? tiZANidine (ZANAFLEX) 4 mg, Oral, Every 6 hours PRN  ? traMADol (ULTRAM) 50-100 mg, Oral, 2 times daily PRN, Maximum dose= 8 tablets per day pain   ? Vitamin D-3 1,000 Units, Oral, 3 times weekly  ? ? ?Allergies as of 02/13/2022 - Review Complete 02/13/2022  ?Allergen Reaction Noted  ? Ace inhibitors Swelling and Other (See Comments) 12/15/2021  ? Amrix [cyclobenzaprine hcl] Other (See Comments) 12/15/2021  ? Aspirin Other (See Comments) 09/11/2011  ? Fluticasone-salmeterol Other (See Comments) 12/15/2021  ? Nsaids Other (See Comments) 08/13/2016  ? Pregabalin Other (See Comments) 12/15/2021  ? Zocor [simvastatin] Other (See Comments) 12/15/2021  ? Nifedipine Anxiety  and Other (See Comments) 12/15/2021  ? ? ?History reviewed. No pertinent family history. ? ?Social History  ? ?Socioeconomic History  ? Marital status: Married  ?  Spouse name: Not on file  ? Number of children: Not on file  ? Years of education: Not on file  ? Highest education level: Not on file  ?Occupational History  ? Not on file  ?Tobacco Use  ? Smoking status: Former  ? Smokeless tobacco: Never  ?Substance and Sexual Activity  ? Alcohol use: No   ? Drug use: No  ? Sexual activity: Never  ?Other Topics Concern  ? Not on file  ?Social History Narrative  ? Right handed  ? Lives in 3 story building  ? Drinks caffeine  ? ?Social Determinants of Health  ? ?Financial Resource Strain: Not on file  ?Food Insecurity: Not on file  ?Transportation Needs: Not on file  ?Physical Activity: Not on file  ?Stress: Not on file  ?Social Connections: Not on file  ?Intimate Partner Violence: Not on file  ? ? ? ?Physical Exam: ?Ht 5\' 3"  (1.6 m)   Wt 144 lb 2 oz (65.4 kg)   BMI 25.53 kg/m?  ?Constitutional: generally well-appearing ?Psychiatric: alert and oriented x3 ?Abdomen: soft, nontender, nondistended, no obvious ascites, no peritoneal signs, normal bowel sounds ?No peripheral edema noted in lower extremities ? ?Assessment and plan: ?87 y.o. female with H. pylori positive gastric ulcer 2 months ago ? ?She was also taking aspirin some over-the-counter NSAIDs periodically and so that might of contributed to the ulcer formation.  I recommended that she continue taking the proton pump inhibitor twice daily and that we proceed with repeat EGD at her soonest convenience to ensure that the ulcer is healed.  She will stay on once daily iron supplement for now, that is likely what is causing her stools to intermittent dark. ? ?She is quite frail and elderly and I think it is safest that we do repeat EGD in the hospital setting. ? ?Please see the "Patient Instructions" section for addition details about the plan. ? ?85, MD ?[facility] Gastroenterology ?02/13/2022, 10:36 AM ? ? ?Total time on date of encounter was 30 minutes (this included time spent preparing to see the patient reviewing records; obtaining and/or reviewing separately obtained history; performing a medically appropriate exam and/or evaluation; counseling and educating the patient and family if present; ordering medications, tests or procedures if applicable; and documenting clinical information in the health  record). ? ? ? ?

## 2022-04-06 DIAGNOSIS — E119 Type 2 diabetes mellitus without complications: Secondary | ICD-10-CM | POA: Diagnosis not present

## 2022-04-06 DIAGNOSIS — E7800 Pure hypercholesterolemia, unspecified: Secondary | ICD-10-CM | POA: Diagnosis not present

## 2022-04-06 DIAGNOSIS — E039 Hypothyroidism, unspecified: Secondary | ICD-10-CM | POA: Diagnosis not present

## 2022-04-06 DIAGNOSIS — I129 Hypertensive chronic kidney disease with stage 1 through stage 4 chronic kidney disease, or unspecified chronic kidney disease: Secondary | ICD-10-CM | POA: Diagnosis not present

## 2022-04-26 ENCOUNTER — Encounter (HOSPITAL_COMMUNITY): Payer: Self-pay | Admitting: Gastroenterology

## 2022-05-03 ENCOUNTER — Ambulatory Visit (HOSPITAL_COMMUNITY)
Admission: RE | Admit: 2022-05-03 | Discharge: 2022-05-03 | Disposition: A | Discharge status: Home / Self Care | Payer: Medicare Other | Source: Ambulatory Visit | Attending: Gastroenterology | Admitting: Gastroenterology

## 2022-05-03 ENCOUNTER — Ambulatory Visit (HOSPITAL_COMMUNITY): Payer: Medicare Other | Admitting: Certified Registered Nurse Anesthetist

## 2022-05-03 ENCOUNTER — Encounter (HOSPITAL_COMMUNITY)
Admission: RE | Disposition: A | Discharge status: Home / Self Care | Payer: Self-pay | Source: Ambulatory Visit | Attending: Gastroenterology

## 2022-05-03 ENCOUNTER — Ambulatory Visit (HOSPITAL_BASED_OUTPATIENT_CLINIC_OR_DEPARTMENT_OTHER): Payer: Medicare Other | Admitting: Certified Registered Nurse Anesthetist

## 2022-05-03 ENCOUNTER — Encounter (HOSPITAL_COMMUNITY): Payer: Self-pay | Admitting: Gastroenterology

## 2022-05-03 ENCOUNTER — Other Ambulatory Visit: Payer: Self-pay

## 2022-05-03 DIAGNOSIS — K2970 Gastritis, unspecified, without bleeding: Secondary | ICD-10-CM

## 2022-05-03 DIAGNOSIS — I10 Essential (primary) hypertension: Secondary | ICD-10-CM

## 2022-05-03 DIAGNOSIS — K3189 Other diseases of stomach and duodenum: Secondary | ICD-10-CM | POA: Diagnosis not present

## 2022-05-03 DIAGNOSIS — Z8711 Personal history of peptic ulcer disease: Secondary | ICD-10-CM | POA: Insufficient documentation

## 2022-05-03 DIAGNOSIS — Z87891 Personal history of nicotine dependence: Secondary | ICD-10-CM

## 2022-05-03 DIAGNOSIS — K279 Peptic ulcer, site unspecified, unspecified as acute or chronic, without hemorrhage or perforation: Secondary | ICD-10-CM | POA: Diagnosis not present

## 2022-05-03 DIAGNOSIS — D509 Iron deficiency anemia, unspecified: Secondary | ICD-10-CM

## 2022-05-03 DIAGNOSIS — Z09 Encounter for follow-up examination after completed treatment for conditions other than malignant neoplasm: Secondary | ICD-10-CM | POA: Insufficient documentation

## 2022-05-03 DIAGNOSIS — Z79899 Other long term (current) drug therapy: Secondary | ICD-10-CM | POA: Insufficient documentation

## 2022-05-03 DIAGNOSIS — K259 Gastric ulcer, unspecified as acute or chronic, without hemorrhage or perforation: Secondary | ICD-10-CM

## 2022-05-03 DIAGNOSIS — E119 Type 2 diabetes mellitus without complications: Secondary | ICD-10-CM | POA: Diagnosis not present

## 2022-05-03 HISTORY — PX: BIOPSY: SHX5522

## 2022-05-03 HISTORY — PX: ESOPHAGOGASTRODUODENOSCOPY (EGD) WITH PROPOFOL: SHX5813

## 2022-05-03 LAB — GLUCOSE, CAPILLARY: Glucose-Capillary: 94 mg/dL (ref 70–99)

## 2022-05-03 SURGERY — ESOPHAGOGASTRODUODENOSCOPY (EGD) WITH PROPOFOL
Anesthesia: Monitor Anesthesia Care

## 2022-05-03 MED ORDER — SODIUM CHLORIDE 0.9 % IV SOLN: INTRAVENOUS | Status: DC

## 2022-05-03 MED ORDER — PROPOFOL 500 MG/50ML IV EMUL
INTRAVENOUS | Status: DC | PRN
  Administered 2022-05-03: 100 ug/kg/min via INTRAVENOUS

## 2022-05-03 MED ORDER — PHENYLEPHRINE HCL (PRESSORS) 10 MG/ML IV SOLN
INTRAVENOUS | Status: AC
  Filled 2022-05-03: qty 1

## 2022-05-03 MED ORDER — LIDOCAINE 2% (20 MG/ML) 5 ML SYRINGE
INTRAMUSCULAR | Status: DC | PRN
  Administered 2022-05-03: 60 mg via INTRAVENOUS

## 2022-05-03 MED ORDER — LACTATED RINGERS IV SOLN: INTRAVENOUS | Status: DC

## 2022-05-03 MED ORDER — PROPOFOL 10 MG/ML IV BOLUS
INTRAVENOUS | Status: DC | PRN
  Administered 2022-05-03 (×2): 20 mg via INTRAVENOUS

## 2022-05-03 SURGICAL SUPPLY — 15 items

## 2022-05-03 NOTE — Op Note (Signed)
[facility] Patient Name: Julie [name] Procedure Date: 05/03/2022 MRN: [id number] Attending MD: Rachael Fee , MD Date of Birth: 1935/03/11 CSN: [id number] Age: 87 Admit Type: Outpatient Procedure:                Upper GI endoscopy Indications:              Follow-up of gastric ulcer; Severe iron deficiency                            anemia hemoglobin 6.14 December 2021 led to GI testing.                            Colonoscopy 12/2021 Dr. Christella Hartigan found diverticulosis                            and hemorrhoids but was otherwise normal. EGD                            12/2021 found clean-based antral ulcer, biopsies                            positive for H. pylori. Etiology likely H. pylori                            plus also NSAIDs. She was put on correct                            antibiotics for eradication. While in the hospital                            she received 1 or 2 units of blood and an IV iron                            infusion, she was instructed to take proton pump                            inhibitor twice daily and an iron supplement once                            daily at discharge Providers:                Rachael Fee, MD, Zoe Lan, RN, Melany Guernsey, Technician, Kym Groom, CRNA Referring MD:              Medicines:                Monitored Anesthesia Care Complications:            No immediate complications. Estimated blood loss:                            None. Estimated Blood Loss:     Estimated blood loss: none. Procedure:  Pre-Anesthesia Assessment:                           - Prior to the procedure, a History and Physical                            was performed, and patient medications and                            allergies were reviewed. The patient's tolerance of                            previous anesthesia was also reviewed. The risks                            and benefits of the  procedure and the sedation                            options and risks were discussed with the patient.                            All questions were answered, and informed consent                            was obtained. Prior Anticoagulants: The patient has                            taken no previous anticoagulant or antiplatelet                            agents. ASA Grade Assessment: III - A patient with                            severe systemic disease. After reviewing the risks                            and benefits, the patient was deemed in                            satisfactory condition to undergo the procedure.                           After obtaining informed consent, the endoscope was                            passed under direct vision. Throughout the                            procedure, the patient's blood pressure, pulse, and                            oxygen saturations were monitored continuously. The  [id number] Olympus endoscope was introduced                            through the mouth, and advanced to the second part                            of duodenum. The upper GI endoscopy was                            accomplished without difficulty. The patient                            tolerated the procedure well. Scope In: Scope Out: Findings:      The site of previous antral ulcer was noted. The ulcer has healed       however there was some residual nodularity and mild inflammation at the       site. This was biopsied to check for H. pylori, neoplasm.      The exam was otherwise without abnormality. Impression:               - The site of previous antral ulcer was noted. The                            ulcer has healed however there was some residual                            nodularity and mild inflammation at the site. This                            was biopsied to check for H. pylori, neoplasm.                           -  The examination was otherwise normal. Moderate Sedation:      Not Applicable - Patient had care per Anesthesia. Recommendation:           - Patient has a contact number available for                            emergencies. The signs and symptoms of potential                            delayed complications were discussed with the                            patient. Return to normal activities tomorrow.                            Written discharge instructions were provided to the                            patient.                           - Resume previous diet.                           -  Continue present medications.                           - Await pathology results. Procedure Code(s):        --- Professional ---                           [phone], Esophagogastroduodenoscopy, flexible,                            transoral; with biopsy, single or multiple Diagnosis Code(s):        --- Professional ---                           K29.70, Gastritis, unspecified, without bleeding                           K27.9, Peptic ulcer, site unspecified, unspecified                            as acute or chronic, without hemorrhage or                            perforation CPT copyright 2019 [employer]. All rights reserved. The codes documented in this report are preliminary and upon coder review may  be revised to meet current compliance requirements. Rachael Fee, MD 05/03/2022 9:44:01 AM This report has been signed electronically. Number of Addenda: 0

## 2022-05-03 NOTE — Anesthesia Preprocedure Evaluation (Signed)
Anesthesia Evaluation  Patient identified by MRN, date of birth, ID band Patient awake    Reviewed: Allergy & Precautions, NPO status , Patient's Chart, lab work & pertinent test results, reviewed documented beta blocker date and time   Airway Mallampati: III  TM Distance: >3 FB Neck ROM: Full    Dental  (+) Missing, Chipped, Dental Advisory Given,    Pulmonary asthma , former smoker,    Pulmonary exam normal breath sounds clear to auscultation       Cardiovascular hypertension, Pt. on home beta blockers and Pt. on medications negative cardio ROS Normal cardiovascular exam Rhythm:Regular Rate:Normal     Neuro/Psych negative neurological ROS  negative psych ROS   GI/Hepatic Neg liver ROS, PUD, GERD  ,  Endo/Other  diabetesHypothyroidism   Renal/GU Renal InsufficiencyRenal disease  negative genitourinary   Musculoskeletal negative musculoskeletal ROS (+)   Abdominal   Peds  Hematology  (+) Blood dyscrasia, anemia , Hgb 6.9-->9.0 s/p 2U RBCs   Anesthesia Other Findings   Reproductive/Obstetrics                             Anesthesia Physical  Anesthesia Plan  ASA: 3  Anesthesia Plan: MAC   Post-op Pain Management: Minimal or no pain anticipated   Induction:   PONV Risk Score and Plan: 2 and Propofol infusion, Treatment may vary due to age or medical condition and Ondansetron  Airway Management Planned: Natural Airway and Nasal Cannula  Additional Equipment:   Intra-op Plan:   Post-operative Plan:   Informed Consent: I have reviewed the patients History and Physical, chart, labs and discussed the procedure including the risks, benefits and alternatives for the proposed anesthesia with the patient or authorized representative who has indicated his/her understanding and acceptance.     Dental advisory given  Plan Discussed with: CRNA  Anesthesia Plan Comments:          Anesthesia Quick Evaluation

## 2022-05-03 NOTE — Anesthesia Postprocedure Evaluation (Signed)
Anesthesia Post Note  Patient: Julie [name]  Procedure(s) Performed: ESOPHAGOGASTRODUODENOSCOPY (EGD) WITH PROPOFOL BIOPSY     Patient location during evaluation: PACU Anesthesia Type: MAC Level of consciousness: awake and alert Pain management: pain level controlled Vital Signs Assessment: post-procedure vital signs reviewed and stable Respiratory status: spontaneous breathing, nonlabored ventilation, respiratory function stable and patient connected to nasal cannula oxygen Cardiovascular status: stable and blood pressure returned to baseline Postop Assessment: no apparent nausea or vomiting Anesthetic complications: no   No notable events documented.  Last Vitals:  Vitals:   05/03/22 1000 05/03/22 1010  BP: (!) 186/82 (!) 184/92  Pulse: 73 75  Resp: 17 14  Temp:    SpO2: 100% 98%    Last Pain:  Vitals:   05/03/22 1010  TempSrc:   PainSc: 0-No pain                 Tiajuana Amass

## 2022-05-03 NOTE — Transfer of Care (Signed)
Immediate Anesthesia Transfer of Care Note  Patient: Julie [name]  Procedure(s) Performed: ESOPHAGOGASTRODUODENOSCOPY (EGD) WITH PROPOFOL BIOPSY  Patient Location: PACU and Endoscopy Unit  Anesthesia Type:MAC  Level of Consciousness: awake, alert  and patient cooperative  Airway & Oxygen Therapy: Patient Spontanous Breathing and Patient connected to face mask oxygen  Post-op Assessment: Report given to RN and Post -op Vital signs reviewed and stable  Post vital signs: Reviewed and stable  Last Vitals:  Vitals Value Taken Time  BP 186/84 05/03/22 0948  Temp    Pulse 88 05/03/22 0949  Resp 12 05/03/22 0949  SpO2 100 % 05/03/22 0949  Vitals shown include unvalidated device data.  Last Pain:  Vitals:   05/03/22 0848  TempSrc: Temporal  PainSc: 8       Patients Stated Pain Goal: 3 (09/32/35 5732)  Complications: No notable events documented.

## 2022-05-03 NOTE — Anesthesia Procedure Notes (Addendum)
Procedure Name: MAC Date/Time: 05/03/2022 9:27 AM  Performed by: West Pugh, CRNAPre-anesthesia Checklist: Patient identified, Emergency Drugs available, Suction available, Patient being monitored and Timeout performed Patient Re-evaluated:Patient Re-evaluated prior to induction Oxygen Delivery Method: Simple face mask Preoxygenation: Pre-oxygenation with 100% oxygen Induction Type: IV induction Placement Confirmation: positive ETCO2 Dental Injury: Teeth and Oropharynx as per pre-operative assessment

## 2022-05-03 NOTE — Discharge Instructions (Signed)
YOU HAD AN ENDOSCOPIC PROCEDURE TODAY: Refer to the procedure report and other information in the discharge instructions given to you for any specific questions about what was found during the examination. If this information does not answer your questions, please call [place] office at [phone] to clarify.   YOU SHOULD EXPECT: Some feelings of bloating in the abdomen. Passage of more gas than usual. Walking can help get rid of the air that was put into your GI tract during the procedure and reduce the bloating. If you had a lower endoscopy (such as a colonoscopy or flexible sigmoidoscopy) you may notice spotting of blood in your stool or on the toilet paper. Some abdominal soreness may be present for a day or two, also.  DIET: Your first meal following the procedure should be a light meal and then it is ok to progress to your normal diet. A half-sandwich or bowl of soup is an example of a good first meal. Heavy or fried foods are harder to digest and may make you feel nauseous or bloated. Drink plenty of fluids but you should avoid alcoholic beverages for 24 hours. If you had a esophageal dilation, please see attached instructions for diet.    ACTIVITY: Your care partner should take you home directly after the procedure. You should plan to take it easy, moving slowly for the rest of the day. You can resume normal activity the day after the procedure however YOU SHOULD NOT DRIVE, use power tools, machinery or perform tasks that involve climbing or major physical exertion for 24 hours (because of the sedation medicines used during the test).   SYMPTOMS TO REPORT IMMEDIATELY: A gastroenterologist can be reached at any hour. Please call [phone]  for any of the following symptoms:   Following upper endoscopy (EGD, EUS, ERCP, esophageal dilation) Vomiting of blood or coffee ground material  New, significant abdominal pain  New, significant chest pain or pain under the shoulder blades  Painful or  persistently difficult swallowing  New shortness of breath  Black, tarry-looking or red, bloody stools  FOLLOW UP:  If any biopsies were taken you will be contacted by phone or by letter within the next 1-3 weeks. Call [phone]  if you have not heard about the biopsies in 3 weeks.  Please also call with any specific questions about appointments or follow up tests.  

## 2022-05-03 NOTE — H&P (Signed)
Severe iron deficiency anemia hemoglobin 6.14 December 2021 led to GI testing.  Colonoscopy 12/2021 Dr. Christella Hartigan found diverticulosis and hemorrhoids but was otherwise normal.  EGD 12/2021 found clean-based antral ulcer, biopsies positive for H. pylori.  Etiology likely H. pylori plus also NSAIDs.  She was put on correct antibiotics for eradication.  While in the hospital she received 1 or 2 units of blood and an IV iron infusion, she was instructed to take proton pump inhibitor twice daily and an iron supplement once daily at discharge.    HPI: This is a woman with h.o gastric ulcer   ROS: complete GI ROS as described in HPI, all other review negative.  Constitutional:  No unintentional weight loss   Past Medical History:  Diagnosis Date   Anemia    Asthma    Diabetes (HCC)    GERD (gastroesophageal reflux disease)    Gout    Heart palpitations    Hypertension    Hypothyroidism    Peptic ulcer     Past Surgical History:  Procedure Laterality Date   BIOPSY  12/17/2021   Procedure: BIOPSY;  Surgeon: Rachael Fee, MD;  Location: [facility];  Service: Gastroenterology;;   Arbutus Leas Bilateral    COLONOSCOPY WITH PROPOFOL N/A 12/17/2021   Procedure: COLONOSCOPY WITH PROPOFOL;  Surgeon: Rachael Fee, MD;  Location: [facility];  Service: Gastroenterology;  Laterality: N/A;   ESOPHAGOGASTRODUODENOSCOPY (EGD) WITH PROPOFOL N/A 12/17/2021   Procedure: ESOPHAGOGASTRODUODENOSCOPY (EGD) WITH PROPOFOL;  Surgeon: Rachael Fee, MD;  Location: [facility];  Service: Gastroenterology;  Laterality: N/A;   HEMORRHOID SURGERY     OVARIAN CYST REMOVAL Left    THROAT SURGERY     due to not being able to swallow   THYROIDECTOMY      Current Outpatient Medications  Medication Instructions   acetaminophen (TYLENOL) 650 mg, Oral, 2 times daily PRN   amLODipine (NORVASC) 10 mg, Daily   carvedilol (COREG) 3.125 mg, Oral, 2 times daily   colchicine 0.6 mg, Oral, Daily PRN   ferrous  sulfate 325 mg, Oral, Daily   L-Methylfolate-Algae-B12-B6 (METANX) 3-90.314-2-35 MG CAPS 1 tablet, Oral, 2 times daily   levothyroxine (SYNTHROID) 50 mcg, Oral, Daily before breakfast   LORazepam (ATIVAN) 1 mg, Oral, 3 times daily PRN   ondansetron (ZOFRAN) 8 mg, Oral, Every 8 hours PRN   senna (SENOKOT) 8.6 MG TABS tablet 2 tablets, Oral, Daily   tiZANidine (ZANAFLEX) 4 mg, Oral, Every 8 hours PRN   traMADol (ULTRAM) 50 mg, Oral, 2 times daily PRN, Maximum dose= 8 tablets per day pain    TURMERIC CURCUMIN PO 600 mg, Oral, Daily   Vitamin D-3 1,000 Units, Oral, Daily    Allergies as of 02/13/2022 - Review Complete 02/13/2022  Allergen Reaction Noted   Ace inhibitors Swelling and Other (See Comments) 12/15/2021   Amrix [cyclobenzaprine hcl] Other (See Comments) 12/15/2021   Aspirin Other (See Comments) 09/11/2011   Fluticasone-salmeterol Other (See Comments) 12/15/2021   Nsaids Other (See Comments) 08/13/2016   Pregabalin Other (See Comments) 12/15/2021   Zocor [simvastatin] Other (See Comments) 12/15/2021   Nifedipine Anxiety and Other (See Comments) 12/15/2021    Family History  Problem Relation Age of Onset   Diabetes Mother    Diabetes Sister     Social History   Socioeconomic History   Marital status: Married    Spouse name: Not on file   Number of children: 0   Years of education: Not on file   Highest  education level: Not on file  Occupational History   Occupation: retired  Tobacco Use   Smoking status: Former   Smokeless tobacco: Never  Building services engineer Use: Never used  Substance and Sexual Activity   Alcohol use: No   Drug use: No   Sexual activity: Never  Other Topics Concern   Not on file  Social History Narrative   Right handed   Lives in 3 story building   Drinks caffeine   Social Determinants of Health   Financial Resource Strain: Not on file  Food Insecurity: Not on file  Transportation Needs: Not on file  Physical Activity: Not on file   Stress: Not on file  Social Connections: Not on file  Intimate Partner Violence: Not on file     Physical Exam: There were no vitals taken for this visit. Constitutional: generally well-appearing Psychiatric: alert and oriented x3 Abdomen: soft, nontender, nondistended, no obvious ascites, no peritoneal signs, normal bowel sounds No peripheral edema noted in lower extremities  Assessment and plan: 87 y.o. female with h/o gastric ulcer  EGD today  Please see the "Patient Instructions" section for addition details about the plan.  Rob Bunting, MD [facility] 05/03/2022, 8:40 AM

## 2022-05-04 ENCOUNTER — Encounter (HOSPITAL_COMMUNITY): Payer: Self-pay | Admitting: Gastroenterology

## 2022-05-09 LAB — SURGICAL PATHOLOGY

## 2022-05-14 ENCOUNTER — Encounter: Payer: Self-pay | Admitting: Gastroenterology

## 2022-06-08 ENCOUNTER — Other Ambulatory Visit (INDEPENDENT_AMBULATORY_CARE_PROVIDER_SITE_OTHER): Payer: Medicare Other

## 2022-06-08 ENCOUNTER — Ambulatory Visit: Payer: Medicare Other | Admitting: Nurse Practitioner

## 2022-06-08 ENCOUNTER — Other Ambulatory Visit: Payer: Self-pay

## 2022-06-08 DIAGNOSIS — K625 Hemorrhage of anus and rectum: Secondary | ICD-10-CM | POA: Diagnosis not present

## 2022-06-08 LAB — COMPREHENSIVE METABOLIC PANEL WITH GFR
ALT: 10 U/L (ref 0–35)
AST: 16 U/L (ref 0–37)
Albumin: 3.7 g/dL (ref 3.5–5.2)
Alkaline Phosphatase: 87 U/L (ref 39–117)
BUN: 23 mg/dL (ref 6–23)
CO2: 22 meq/L (ref 19–32)
Calcium: 8.9 mg/dL (ref 8.4–10.5)
Chloride: 109 meq/L (ref 96–112)
Creatinine, Ser: 1.31 mg/dL — ABNORMAL HIGH (ref 0.40–1.20)
GFR: 37.17 mL/min — ABNORMAL LOW
Glucose, Bld: 89 mg/dL (ref 70–99)
Potassium: 4.1 meq/L (ref 3.5–5.1)
Sodium: 141 meq/L (ref 135–145)
Total Bilirubin: 0.5 mg/dL (ref 0.2–1.2)
Total Protein: 6.9 g/dL (ref 6.0–8.3)

## 2022-06-08 LAB — CBC WITH DIFFERENTIAL/PLATELET
Basophils Absolute: 0.1 10*3/uL (ref 0.0–0.1)
Basophils Relative: 1.1 % (ref 0.0–3.0)
Eosinophils Absolute: 0.1 10*3/uL (ref 0.0–0.7)
Eosinophils Relative: 1 % (ref 0.0–5.0)
HCT: 34.5 % — ABNORMAL LOW (ref 36.0–46.0)
Hemoglobin: 11.5 g/dL — ABNORMAL LOW (ref 12.0–15.0)
Lymphocytes Relative: 23.4 % (ref 12.0–46.0)
Lymphs Abs: 1.4 10*3/uL (ref 0.7–4.0)
MCHC: 33.3 g/dL (ref 30.0–36.0)
MCV: 93.7 fl (ref 78.0–100.0)
Monocytes Absolute: 0.7 10*3/uL (ref 0.1–1.0)
Monocytes Relative: 11.8 % (ref 3.0–12.0)
Neutro Abs: 3.7 10*3/uL (ref 1.4–7.7)
Neutrophils Relative %: 62.7 % (ref 43.0–77.0)
Platelets: 211 10*3/uL (ref 150.0–400.0)
RBC: 3.68 Mil/uL — ABNORMAL LOW (ref 3.87–5.11)
RDW: 14.4 % (ref 11.5–15.5)
WBC: 5.8 10*3/uL (ref 4.0–10.5)

## 2022-06-08 NOTE — Progress Notes (Deleted)
06/08/2022 Julie [name] [id number] 1935/07/05   Chief Complaint: Blood in stool  History of Present Illness: Julie [name]. Julie [name] is an 87 year old female with a history of diverticulosis, H. pylori positive gastric ulcer and iron deficiency anemia.  She is known by Dr. Christella Hartigan.  Review of pertinent gastrointestinal problems: 1.  Severe iron deficiency anemia hemoglobin 6.14 December 2021 led to GI testing.  Colonoscopy 12/2021 Dr. Christella Hartigan found diverticulosis and hemorrhoids but was otherwise normal.  EGD 12/2021 found clean-based antral ulcer, biopsies positive for H. pylori.  Etiology likely H. pylori plus also NSAIDs.  She was put on correct antibiotics for eradication.  While in the hospital she received 1 or 2 units of blood and an IV iron infusion, she was instructed to take proton pump inhibitor twice daily and an iron supplement once daily at discharge.  EGD 05/03/2022: - The site of previous antral ulcer was noted. The ulcer has healed however there was some residual nodularity and mild inflammation at the site. This was biopsied to check for H. pylori, neoplasm. - The examination was otherwise normal. A. STOMACH, DISTAL, BIOPSY:  - Gastric mucosa with non-necrotizing granuloma.  - No Helicobacter pylori identified. ADDENDUM:   No acid-fast bacilli identified with AFB stain and no fungi  identified with PAS or GMS stains.     EGD 12/17/2021: - Clean based distal gastric ulcer, low risk for rebleeding. Biopsies taken from the surrounding edematous mucosa to check for H. pylori, neoplasm (which is unlikely). - Small hiatal hernia. A. GASTRIC BIOPSY:  Mild chronic active gastritis.  No dysplasia or malignancy is seen.  Alcian blue stain with appropriate controls does not highlight any  intestinal metaplasia.  Immunohistochemistry with appropriate controls for single antibody for  H. pylori highlights rare H pylori-like organisms.   Colonoscopy 12/17/2021: - Diverticulosis  throughout the colon. - External and internal hemorrhoids. - The examination was otherwise normal on direct and retroflexion views. - No polyps or cancers.      Latest Ref Rng & Units 02/04/2022   11:45 AM 12/17/2021    8:01 AM 12/16/2021    2:11 PM  CBC  WBC 4.0 - 10.5 K/uL 4.5  6.9  5.3   Hemoglobin 12.0 - 15.0 g/dL 23.7  62.8  9.0   Hematocrit 36.0 - 46.0 % 38.8  32.9  28.0   Platelets 150 - 400 K/uL 226  298  257         Latest Ref Rng & Units 02/04/2022   11:45 AM 12/16/2021    2:11 PM 12/15/2021    2:22 PM  CMP  Glucose 70 - 99 mg/dL 315  97  176   BUN 8 - 23 mg/dL 18  17  26    Creatinine 0.44 - 1.00 mg/dL  1.60  7.37   Sodium 135 - 145 mmol/L 140  139  139   Potassium 3.5 - 5.1 mmol/L 4.0  4.4  4.8   Chloride 98 - 111 mmol/L 109  111  110   CO2 22 - 32 mmol/L 22  22  23    Calcium 8.9 - 10.3 mg/dL 9.4  8.6  9.0   Total Protein 6.5 - 8.1 g/dL 7.2   7.2   Total Bilirubin 0.3 - 1.2 mg/dL 0.5   0.3   Alkaline Phos 38 - 126 U/L 87   85   AST 15 - 41 U/L 27   25   ALT 0 - 44 U/L 14  13     Current Medications, Allergies, Past Medical History, Past Surgical History, Family History and Social History were reviewed in [employer] record.   Review of Systems:   Constitutional: Negative for fever, sweats, chills or weight loss.  Respiratory: Negative for shortness of breath.   Cardiovascular: Negative for chest pain, palpitations and leg swelling.  Gastrointestinal: See HPI.  Musculoskeletal: Negative for back pain or muscle aches.  Neurological: Negative for dizziness, headaches or paresthesias.    Physical Exam: There were no vitals taken for this visit. General: Well developed, w   ***female in no acute distress. Head: Normocephalic and atraumatic. Eyes: No scleral icterus. Conjunctiva pink . Ears: Normal auditory acuity. Mouth: Dentition intact. No ulcers or lesions.  Lungs: Clear throughout to auscultation. Heart: Regular rate and rhythm,  no murmur. Abdomen: Soft, nontender and nondistended. No masses or hepatomegaly. Normal bowel sounds x 4 quadrants.  Rectal: *** Musculoskeletal: Symmetrical with no gross deformities. Extremities: No edema. Neurological: Alert oriented x 4. No focal deficits.  Psychological: Alert and cooperative. Normal mood and affect  Assessment and Recommendations: ***

## 2022-06-22 DIAGNOSIS — M109 Gout, unspecified: Secondary | ICD-10-CM | POA: Diagnosis not present

## 2022-06-22 DIAGNOSIS — E119 Type 2 diabetes mellitus without complications: Secondary | ICD-10-CM | POA: Diagnosis not present

## 2022-06-22 DIAGNOSIS — E559 Vitamin D deficiency, unspecified: Secondary | ICD-10-CM | POA: Diagnosis not present

## 2022-06-22 DIAGNOSIS — E039 Hypothyroidism, unspecified: Secondary | ICD-10-CM | POA: Diagnosis not present

## 2022-06-22 DIAGNOSIS — I10 Essential (primary) hypertension: Secondary | ICD-10-CM | POA: Diagnosis not present

## 2022-06-29 DIAGNOSIS — M5136 Other intervertebral disc degeneration, lumbar region: Secondary | ICD-10-CM | POA: Diagnosis not present

## 2022-06-29 DIAGNOSIS — B9681 Helicobacter pylori [H. pylori] as the cause of diseases classified elsewhere: Secondary | ICD-10-CM | POA: Diagnosis not present

## 2022-06-29 DIAGNOSIS — Z0000 Encounter for general adult medical examination without abnormal findings: Secondary | ICD-10-CM | POA: Diagnosis not present

## 2022-06-29 DIAGNOSIS — E039 Hypothyroidism, unspecified: Secondary | ICD-10-CM | POA: Diagnosis not present

## 2022-06-29 DIAGNOSIS — Z862 Personal history of diseases of the blood and blood-forming organs and certain disorders involving the immune mechanism: Secondary | ICD-10-CM | POA: Diagnosis not present

## 2022-06-29 DIAGNOSIS — Z8711 Personal history of peptic ulcer disease: Secondary | ICD-10-CM | POA: Diagnosis not present

## 2022-07-23 ENCOUNTER — Ambulatory Visit: Payer: Medicare Other | Admitting: Nurse Practitioner

## 2022-10-12 ENCOUNTER — Encounter: Payer: Self-pay | Admitting: Gastroenterology

## 2022-12-21 DIAGNOSIS — M109 Gout, unspecified: Secondary | ICD-10-CM | POA: Diagnosis not present

## 2022-12-21 DIAGNOSIS — E039 Hypothyroidism, unspecified: Secondary | ICD-10-CM | POA: Diagnosis not present

## 2022-12-21 DIAGNOSIS — E1122 Type 2 diabetes mellitus with diabetic chronic kidney disease: Secondary | ICD-10-CM | POA: Diagnosis not present

## 2022-12-21 DIAGNOSIS — Z862 Personal history of diseases of the blood and blood-forming organs and certain disorders involving the immune mechanism: Secondary | ICD-10-CM | POA: Diagnosis not present

## 2022-12-21 DIAGNOSIS — I129 Hypertensive chronic kidney disease with stage 1 through stage 4 chronic kidney disease, or unspecified chronic kidney disease: Secondary | ICD-10-CM | POA: Diagnosis not present

## 2022-12-21 DIAGNOSIS — M5136 Other intervertebral disc degeneration, lumbar region: Secondary | ICD-10-CM | POA: Diagnosis not present

## 2022-12-28 DIAGNOSIS — Z8711 Personal history of peptic ulcer disease: Secondary | ICD-10-CM | POA: Diagnosis not present

## 2022-12-28 DIAGNOSIS — I129 Hypertensive chronic kidney disease with stage 1 through stage 4 chronic kidney disease, or unspecified chronic kidney disease: Secondary | ICD-10-CM | POA: Diagnosis not present

## 2022-12-28 DIAGNOSIS — N1832 Chronic kidney disease, stage 3b: Secondary | ICD-10-CM | POA: Diagnosis not present

## 2022-12-28 DIAGNOSIS — E1122 Type 2 diabetes mellitus with diabetic chronic kidney disease: Secondary | ICD-10-CM | POA: Diagnosis not present

## 2022-12-28 DIAGNOSIS — M109 Gout, unspecified: Secondary | ICD-10-CM | POA: Diagnosis not present

## 2022-12-28 DIAGNOSIS — E039 Hypothyroidism, unspecified: Secondary | ICD-10-CM | POA: Diagnosis not present

## 2022-12-28 DIAGNOSIS — Z862 Personal history of diseases of the blood and blood-forming organs and certain disorders involving the immune mechanism: Secondary | ICD-10-CM | POA: Diagnosis not present

## 2022-12-28 DIAGNOSIS — M5136 Other intervertebral disc degeneration, lumbar region: Secondary | ICD-10-CM | POA: Diagnosis not present

## 2023-01-11 ENCOUNTER — Encounter: Payer: Self-pay | Admitting: Gastroenterology

## 2023-03-01 ENCOUNTER — Ambulatory Visit (AMBULATORY_SURGERY_CENTER): Payer: Medicare Other

## 2023-03-01 VITALS — Ht 64.0 in | Wt 147.0 lb

## 2023-03-01 DIAGNOSIS — K259 Gastric ulcer, unspecified as acute or chronic, without hemorrhage or perforation: Secondary | ICD-10-CM

## 2023-03-01 NOTE — Progress Notes (Signed)
No egg or soy allergy known to patient  No issues known to pt with past sedation with any surgeries or procedures Patient denies ever being told they had issues or difficulty with intubation  No FH of Malignant Hyperthermia Pt is not on diet pills Pt is not on  home 02  Pt is not on blood thinners  Pt has issues with constipation and takes laxatives vegetable laxative Have any cardiac testing pending--no Pt instructed to use Single[employer] or GoodRx for a price reduction on prep  Patient's chart reviewed by Cathlyn Parsons CNRA prior to previsit and patient appropriate for the [facility].  Previsit completed and red dot placed by patient's name on their procedure day (on provider's schedule).

## 2023-03-15 ENCOUNTER — Encounter: Payer: Self-pay | Admitting: Gastroenterology

## 2023-03-24 ENCOUNTER — Encounter: Payer: Self-pay | Admitting: Certified Registered Nurse Anesthetist

## 2023-03-25 ENCOUNTER — Telehealth: Payer: Self-pay | Admitting: Gastroenterology

## 2023-03-25 NOTE — Telephone Encounter (Signed)
Spoke with patient - all questions answered

## 2023-03-25 NOTE — Telephone Encounter (Signed)
Patient called and requested to speak with a nurse regarding her prep instructions.

## 2023-03-29 ENCOUNTER — Encounter: Payer: Self-pay | Admitting: Gastroenterology

## 2023-03-29 ENCOUNTER — Ambulatory Visit (AMBULATORY_SURGERY_CENTER): Payer: Medicare Other | Admitting: Gastroenterology

## 2023-03-29 VITALS — BP 176/82 | HR 80 | Temp 98.4°F | Resp 11 | Ht 64.0 in | Wt 147.0 lb

## 2023-03-29 DIAGNOSIS — K229 Disease of esophagus, unspecified: Secondary | ICD-10-CM

## 2023-03-29 DIAGNOSIS — K2950 Unspecified chronic gastritis without bleeding: Secondary | ICD-10-CM | POA: Diagnosis not present

## 2023-03-29 DIAGNOSIS — K259 Gastric ulcer, unspecified as acute or chronic, without hemorrhage or perforation: Secondary | ICD-10-CM

## 2023-03-29 DIAGNOSIS — K317 Polyp of stomach and duodenum: Secondary | ICD-10-CM

## 2023-03-29 DIAGNOSIS — E119 Type 2 diabetes mellitus without complications: Secondary | ICD-10-CM | POA: Diagnosis not present

## 2023-03-29 DIAGNOSIS — K449 Diaphragmatic hernia without obstruction or gangrene: Secondary | ICD-10-CM

## 2023-03-29 DIAGNOSIS — D131 Benign neoplasm of stomach: Secondary | ICD-10-CM | POA: Diagnosis not present

## 2023-03-29 DIAGNOSIS — J45909 Unspecified asthma, uncomplicated: Secondary | ICD-10-CM | POA: Diagnosis not present

## 2023-03-29 DIAGNOSIS — E039 Hypothyroidism, unspecified: Secondary | ICD-10-CM | POA: Diagnosis not present

## 2023-03-29 DIAGNOSIS — I10 Essential (primary) hypertension: Secondary | ICD-10-CM | POA: Diagnosis not present

## 2023-03-29 MED ORDER — ESOMEPRAZOLE MAGNESIUM 40 MG PO CPDR: 40.0000 mg | DELAYED_RELEASE_CAPSULE | Freq: Two times a day (BID) | ORAL | 1 refills | Status: DC

## 2023-03-29 MED ORDER — SODIUM CHLORIDE 0.9 % IV SOLN: 500.0000 mL | Freq: Once | INTRAVENOUS | Status: DC

## 2023-03-29 MED ORDER — SUCRALFATE 1 G PO TABS
1.0000 g | ORAL_TABLET | Freq: Two times a day (BID) | ORAL | 1 refills | Status: DC
Start: 2023-03-29 — End: 2023-05-21

## 2023-03-29 NOTE — Patient Instructions (Addendum)
Patient has a contact number available for                            emergencies. The signs and symptoms of potential                            delayed complications were discussed with the                            patient. Return to normal activities tomorrow.                            Written discharge instructions were provided to the                            patient.                           - Resume previous diet.                           - Initiate Nexium 40 mg twice daily.                           - Carafate twice daily for 2 months. Then may stop.                           - Observe patient's clinical course.                           - Await pathology results.                           - Repeat endoscopy in 3 to 4 months to ensure                            healing of gastric ulcer and proceed, if patient                            agrees, with duodenal adenoma resection attempt.                            This would be done in the hospital-based setting                            for safety purposes.                           - The findings and recommendations were discussed                            with the patient.                           - The findings and recommendations were discussed  with the designated responsible adult.         YOU HAD AN ENDOSCOPIC PROCEDURE TODAY AT THE [facility]:   Refer to the procedure report that was given to you for any specific questions about what was found during the examination.  If the procedure report does not answer your questions, please call your gastroenterologist to clarify.  If you requested that your care partner not be given the details of your procedure findings, then the procedure report has been included in a sealed envelope for you to review at your convenience later.  YOU SHOULD EXPECT: Some feelings of bloating in the abdomen. Passage of more gas than usual.  Walking can  help get rid of the air that was put into your GI tract during the procedure and reduce the bloating. If you had a lower endoscopy (such as a colonoscopy or flexible sigmoidoscopy) you may notice spotting of blood in your stool or on the toilet paper. If you underwent a bowel prep for your procedure, you may not have a normal bowel movement for a few days.  Please Note:  You might notice some irritation and congestion in your nose or some drainage.  This is from the oxygen used during your procedure.  There is no need for concern and it should clear up in a day or so.  SYMPTOMS TO REPORT IMMEDIATELY:   Following upper endoscopy (EGD)  Vomiting of blood or coffee ground material  New chest pain or pain under the shoulder blades  Painful or persistently difficult swallowing  New shortness of breath  Fever of 100F or higher  Black, tarry-looking stools  For urgent or emergent issues, a gastroenterologist can be reached at any hour by calling [phone]. Do not use MyChart messaging for urgent concerns.    DIET:  We do recommend a small meal at first, but then you may proceed to your regular diet.  Drink plenty of fluids but you should avoid alcoholic beverages for 24 hours.  ACTIVITY:  You should plan to take it easy for the rest of today and you should NOT DRIVE or use heavy machinery until tomorrow (because of the sedation medicines used during the test).    FOLLOW UP: Our staff will call the number listed on your records the next business day following your procedure.  We will call around 7:15- 8:00 am to check on you and address any questions or concerns that you may have regarding the information given to you following your procedure. If we do not reach you, we will leave a message.     If any biopsies were taken you will be contacted by phone or by letter within the next 1-3 weeks.  Please call us at [phone] if you have not heard about the biopsies in 3 weeks.     SIGNATURES/CONFIDENTIALITY: You and/or your care partner have signed paperwork which will be entered into your electronic medical record.  These signatures attest to the fact that that the information above on your After Visit Summary has been reviewed and is understood.  Full responsibility of the confidentiality of this discharge information lies with you and/or your care-partner.

## 2023-03-29 NOTE — Op Note (Signed)
[facility] Patient Name: Julie [name] Procedure Date: 03/29/2023 10:58 AM MRN: [id number] Endoscopist: Corliss Parish , MD, [id number] Age: 88 Referring MD:  Date of Birth: Jul 13, 1935 Gender: Female Account #: [id number] Procedure:                Upper GI endoscopy Indications:              Follow-up of gastric ulcer, History of granulomas                            noted on the last endoscopy in the stomach Medicines:                Monitored Anesthesia Care Procedure:                Pre-Anesthesia Assessment:                           - Prior to the procedure, a History and Physical                            was performed, and patient medications and                            allergies were reviewed. The patient's tolerance of                            previous anesthesia was also reviewed. The risks                            and benefits of the procedure and the sedation                            options and risks were discussed with the patient.                            All questions were answered, and informed consent                            was obtained. Prior Anticoagulants: The patient has                            taken no anticoagulant or antiplatelet agents. ASA                            Grade Assessment: II - A patient with mild systemic                            disease. After reviewing the risks and benefits,                            the patient was deemed in satisfactory condition to                            undergo the procedure.  After obtaining informed consent, the endoscope was                            passed under direct vision. Throughout the                            procedure, the patient's blood pressure, pulse, and                            oxygen saturations were monitored continuously. The                            Olympus Scope [id number] was introduced through the                            mouth,  and advanced to the third part of duodenum.                            The upper GI endoscopy was accomplished without                            difficulty. The patient tolerated the procedure. Scope In: Scope Out: Findings:                 No gross lesions were noted in the entire esophagus.                           The Z-line was irregular and was found 35 cm from                            the incisors.                           A 4 cm hiatal hernia was present.                           A medium scar was found in the gastric antrum.                           One non-bleeding cratered gastric ulcer with a                            clean ulcer base (Forrest Class III) was found on                            the posterior wall of the gastric antrum (this was                            adjacent to the previous scar). The lesion was 12                            mm in largest dimension. Biopsies were taken with a  cold forceps for histology.                           Patchy mildly erythematous mucosa without bleeding                            was found in the entire examined stomach. Biopsies                            were taken with a cold forceps for histology and                            Helicobacter pylori testing.                           A single 20 mm sessile polyp was found in the third                            portion of the duodenum. A single biopsy was                            performed with a cold forceps for histology. Area                            proximal to the lesion was tattooed with an                            injection of Spot (carbon black) for demarcation                            purposes.                           No other gross lesions were noted in the duodenal                            bulb, in the first portion of the duodenum, in the                            second portion of the duodenum and in the third                             portion of the duodenum. Biopsies were taken with a                            cold forceps for histology. Complications:            No immediate complications. Estimated Blood Loss:     Estimated blood loss was minimal. Impression:               - No gross lesions in the entire esophagus. Z-line                            irregular, 35 cm from the  incisors.                           - 4 cm hiatal hernia.                           - Scar in the gastric antrum.                           - Non-bleeding gastric ulcer with a clean ulcer                            base (Forrest Class III) in the antrum. Biopsied.                           - Erythematous mucosa in the stomach. Biopsied.                           - A single duodenal polyp in D3. Biopsied. Tattooed                            proximally.                           - No other gross lesions in the duodenal bulb, in                            the first portion of the duodenum, in the second                            portion of the duodenum and in the third portion of                            the duodenum. Biopsied. Recommendation:           - The patient will be observed post-procedure,                            until all discharge criteria are met.                           - Discharge patient to home.                           - Patient has a contact number available for                            emergencies. The signs and symptoms of potential                            delayed complications were discussed with the                            patient. Return to normal activities tomorrow.  Written discharge instructions were provided to the                            patient.                           - Resume previous diet.                           - Initiate Nexium 40 mg twice daily.                           - Carafate twice daily for 2 months. Then may stop.                            - Observe patient's clinical course.                           - Await pathology results.                           - Repeat endoscopy in 3 to 4 months to ensure                            healing of gastric ulcer and proceed, if patient                            agrees, with duodenal adenoma resection attempt.                            This would be done in the hospital-based setting                            for safety purposes.                           - The findings and recommendations were discussed                            with the patient.                           - The findings and recommendations were discussed                            with the designated responsible adult. Corliss Parish, MD 03/29/2023 11:43:34 AM

## 2023-03-29 NOTE — Progress Notes (Signed)
VS completed by DT.  Pt's states no medical or surgical changes since previsit or office visit.  

## 2023-03-29 NOTE — Progress Notes (Signed)
GASTROENTEROLOGY PROCEDURE H&P NOTE   Primary Care Physician: Irena Reichmann, DO  HPI: Julie [name] is a 88 y.o. female who presents for EGD for followup of gastric granuloma and gastritis and previous gastric ulcer.  Past Medical History:  Diagnosis Date   Anemia    Asthma    Diabetes (HCC)    GERD (gastroesophageal reflux disease)    Gout    Heart palpitations    Hypertension    Hypothyroidism    Peptic ulcer    Past Surgical History:  Procedure Laterality Date   BIOPSY  12/17/2021   Procedure: BIOPSY;  Surgeon: Rachael Fee, MD;  Location: [facility];  Service: Gastroenterology;;   BIOPSY  05/03/2022   Procedure: BIOPSY;  Surgeon: Rachael Fee, MD;  Location: [facility];  Service: Gastroenterology;;   Arbutus Leas Bilateral    COLONOSCOPY WITH PROPOFOL N/A 12/17/2021   Procedure: COLONOSCOPY WITH PROPOFOL;  Surgeon: Rachael Fee, MD;  Location: [facility];  Service: Gastroenterology;  Laterality: N/A;   ESOPHAGOGASTRODUODENOSCOPY (EGD) WITH PROPOFOL N/A 12/17/2021   Procedure: ESOPHAGOGASTRODUODENOSCOPY (EGD) WITH PROPOFOL;  Surgeon: Rachael Fee, MD;  Location: [facility];  Service: Gastroenterology;  Laterality: N/A;   ESOPHAGOGASTRODUODENOSCOPY (EGD) WITH PROPOFOL N/A 05/03/2022   Procedure: ESOPHAGOGASTRODUODENOSCOPY (EGD) WITH PROPOFOL;  Surgeon: Rachael Fee, MD;  Location: [facility];  Service: Gastroenterology;  Laterality: N/A;   HEMORRHOID SURGERY     OVARIAN CYST REMOVAL Left    THROAT SURGERY     due to not being able to swallow   THYROIDECTOMY     Current Outpatient Medications  Medication Sig Dispense Refill   acetaminophen (TYLENOL) 650 MG CR tablet Take 650 mg by mouth 2 (two) times daily as needed for pain.     amLODipine (NORVASC) 10 MG tablet Take 10 mg by mouth daily.     carvedilol (COREG) 3.125 MG tablet Take 3.125 mg by mouth 2 (two) times daily.     Cholecalciferol (VITAMIN D-3) 25 MCG (1000 UT) CAPS Take 1,000  Units by mouth daily.     colchicine 0.6 MG tablet Take 0.6 mg by mouth daily as needed (gout). (Patient not taking: Reported on 03/01/2023)     ferrous sulfate 325 (65 FE) MG tablet Take 1 tablet (325 mg total) by mouth daily. 30 tablet 3   L-Methylfolate-Algae-B12-B6 (METANX) 3-90.314-2-35 MG CAPS Take 1 tablet by mouth in the morning and at bedtime.     levothyroxine (SYNTHROID, LEVOTHROID) 50 MCG tablet Take 50 mcg by mouth daily before breakfast. (Patient not taking: Reported on 03/01/2023)     LORazepam (ATIVAN) 1 MG tablet Take 1 mg by mouth 3 (three) times daily as needed (for vertigo).     ondansetron (ZOFRAN) 8 MG tablet Take 8 mg by mouth every 8 (eight) hours as needed for nausea or vomiting.     ONETOUCH ULTRA test strip USE SUBCUTANEOUSLY TO CHECK BLOOD SUGAR TWICE DAILY for 90     senna (SENOKOT) 8.6 MG TABS tablet Take 2 tablets by mouth daily.     tiZANidine (ZANAFLEX) 4 MG tablet Take 4 mg by mouth every 8 (eight) hours as needed for muscle spasms.     traMADol (ULTRAM) 50 MG tablet Take 50 mg by mouth 2 (two) times daily as needed for moderate pain. Maximum dose= 8 tablets per day pain     TURMERIC CURCUMIN PO Take 600 mg by mouth daily.     No current facility-administered medications for this visit.  Current Outpatient Medications:    acetaminophen (TYLENOL) 650 MG CR tablet, Take 650 mg by mouth 2 (two) times daily as needed for pain., Disp: , Rfl:    amLODipine (NORVASC) 10 MG tablet, Take 10 mg by mouth daily., Disp: , Rfl:    carvedilol (COREG) 3.125 MG tablet, Take 3.125 mg by mouth 2 (two) times daily., Disp: , Rfl:    Cholecalciferol (VITAMIN D-3) 25 MCG (1000 UT) CAPS, Take 1,000 Units by mouth daily., Disp: , Rfl:    colchicine 0.6 MG tablet, Take 0.6 mg by mouth daily as needed (gout). (Patient not taking: Reported on 03/01/2023), Disp: , Rfl:    ferrous sulfate 325 (65 FE) MG tablet, Take 1 tablet (325 mg total) by mouth daily., Disp: 30 tablet, Rfl: 3    L-Methylfolate-Algae-B12-B6 (METANX) 3-90.314-2-35 MG CAPS, Take 1 tablet by mouth in the morning and at bedtime., Disp: , Rfl:    levothyroxine (SYNTHROID, LEVOTHROID) 50 MCG tablet, Take 50 mcg by mouth daily before breakfast. (Patient not taking: Reported on 03/01/2023), Disp: , Rfl:    LORazepam (ATIVAN) 1 MG tablet, Take 1 mg by mouth 3 (three) times daily as needed (for vertigo)., Disp: , Rfl:    ondansetron (ZOFRAN) 8 MG tablet, Take 8 mg by mouth every 8 (eight) hours as needed for nausea or vomiting., Disp: , Rfl:    ONETOUCH ULTRA test strip, USE SUBCUTANEOUSLY TO CHECK BLOOD SUGAR TWICE DAILY for 90, Disp: , Rfl:    senna (SENOKOT) 8.6 MG TABS tablet, Take 2 tablets by mouth daily., Disp: , Rfl:    tiZANidine (ZANAFLEX) 4 MG tablet, Take 4 mg by mouth every 8 (eight) hours as needed for muscle spasms., Disp: , Rfl:    traMADol (ULTRAM) 50 MG tablet, Take 50 mg by mouth 2 (two) times daily as needed for moderate pain. Maximum dose= 8 tablets per day pain, Disp: , Rfl:    TURMERIC CURCUMIN PO, Take 600 mg by mouth daily., Disp: , Rfl:  Allergies  Allergen Reactions   Ace Inhibitors Swelling and Other (See Comments)    Angioedemia  Lisinopril    Advair Hfa [Fluticasone-Salmeterol] Other (See Comments)    Blurred vision    Amrix [Cyclobenzaprine Hcl] Other (See Comments)    Abnormal LFT(s)   Aspirin Other (See Comments)    Bleeding    Nsaids Other (See Comments)    Bleeding   Pregabalin Other (See Comments)    Patient did not want to experience possible side effects   Zocor [Simvastatin] Other (See Comments)    Muscle pain   Nifedipine Anxiety and Other (See Comments)    Nervousness   Family History  Problem Relation Age of Onset   Diabetes Mother    Diabetes Sister    Colon cancer Neg Hx    Colon polyps Neg Hx    Rectal cancer Neg Hx    Stomach cancer Neg Hx    Esophageal cancer Neg Hx    Social History   Socioeconomic History   Marital status: Married    Spouse  name: Not on file   Number of children: 0   Years of education: Not on file   Highest education level: Not on file  Occupational History   Occupation: retired  Tobacco Use   Smoking status: Former   Smokeless tobacco: Never  Building services engineer Use: Never used  Substance and Sexual Activity   Alcohol use: No   Drug use: No   Sexual activity:  Never  Other Topics Concern   Not on file  Social History Narrative   Right handed   Lives in 3 story building   Drinks caffeine   Social Determinants of Health   Financial Resource Strain: Not on file  Food Insecurity: Not on file  Transportation Needs: Not on file  Physical Activity: Not on file  Stress: Not on file  Social Connections: Not on file  Intimate Partner Violence: Not on file    Physical Exam: There were no vitals filed for this visit. There is no height or weight on file to calculate BMI. GEN: NAD EYE: Sclerae anicteric ENT: MMM CV: Non-tachycardic GI: Soft, NT/ND NEURO:  Alert & Oriented x 3  Lab Results: No results for input(s): "WBC", "HGB", "HCT", "PLT" in the last 72 hours. BMET No results for input(s): "NA", "K", "CL", "CO2", "GLUCOSE", "BUN", "CREATININE", "CALCIUM" in the last 72 hours. LFT No results for input(s): "PROT", "ALBUMIN", "AST", "ALT", "ALKPHOS", "BILITOT", "BILIDIR", "IBILI" in the last 72 hours. PT/INR No results for input(s): "LABPROT", "INR" in the last 72 hours.   Impression / Plan: This is a 88 y.o.female who presents for EGD for followup of gastric granuloma and gastritis and previous gastric ulcer.  The risks and benefits of endoscopic evaluation/treatment were discussed with the patient and/or family; these include but are not limited to the risk of perforation, infection, bleeding, missed lesions, lack of diagnosis, severe illness requiring hospitalization, as well as anesthesia and sedation related illnesses.  The patient's history has been reviewed, patient examined, no change  in status, and deemed stable for procedure.  The patient and/or family is agreeable to proceed.    Corliss Parish, MD [facility] Office # [phone]

## 2023-03-29 NOTE — Progress Notes (Signed)
Report given to PACU, vss 

## 2023-04-02 ENCOUNTER — Telehealth: Payer: Self-pay

## 2023-04-02 NOTE — Telephone Encounter (Signed)
  Follow up Call-     03/29/2023   10:53 AM  Call back number  Post procedure Call Back phone  # [phone]  Permission to leave phone message Yes     Patient questions:  Do you have a fever, pain , or abdominal swelling? No. Pain Score  0 *  Have you tolerated food without any problems? Yes.    Have you been able to return to your normal activities? Yes.    Do you have any questions about your discharge instructions: Diet   No. Medications  No. Follow up visit  No.  Do you have questions or concerns about your Care? No.  Actions: * If pain score is 4 or above: No action needed, pain <4.

## 2023-04-08 ENCOUNTER — Telehealth: Payer: Self-pay | Admitting: Gastroenterology

## 2023-04-08 NOTE — Telephone Encounter (Signed)
These 2 medications would not normally be associated with increased risk for gout flares.  I believe she has underlying history of it.  In either case, if she is having significant issues that are not able to help with tolerating other medications, my preference would be that she remain on Nexium and she could stop Carafate if that is the case.  Otherwise I will try to let her treat her gout as she normally would and continue on the medications as able. Thanks. GM

## 2023-04-08 NOTE — Telephone Encounter (Signed)
Patient called would ike to know if either medications that were given to her cause Gout symptoms.

## 2023-04-08 NOTE — Telephone Encounter (Signed)
Dr. Meridee Score please advise if Carafate or Nexium could or would cause gout symptoms?

## 2023-04-09 NOTE — Telephone Encounter (Signed)
Patient has been informed of Dr Judie Petit recommendations. Patient states that she will continue Nexium and hold on Carafate for now to see if this will help.

## 2023-04-14 ENCOUNTER — Encounter: Payer: Self-pay | Admitting: Gastroenterology

## 2023-04-23 ENCOUNTER — Telehealth: Payer: Self-pay | Admitting: Gastroenterology

## 2023-04-23 NOTE — Telephone Encounter (Signed)
Inbound call from patient stating that her gout has went away and is wanting to know if Dr. Meridee [name] would like her to take Sucralfate again. Patient is also wanting to know what the medication is for. Please advise.

## 2023-04-23 NOTE — Telephone Encounter (Signed)
The pt has been advised that she can restart her carafate. She is suppose to take for a total of 2 months.  She was told that this is for her ulcer.  She has no further questions and will call back if she has any further questions.

## 2023-04-26 ENCOUNTER — Telehealth: Payer: Self-pay | Admitting: Gastroenterology

## 2023-04-26 NOTE — Telephone Encounter (Signed)
I spoke with the pt and we discussed the path letter in detail and all questions answered to the best of my ability.  She will keep appt in October to discuss repeat procedure.

## 2023-04-26 NOTE — Telephone Encounter (Signed)
Patient called requested to speak with a nurse regarding a path report from June.

## 2023-04-26 NOTE — Telephone Encounter (Signed)
What does this all mean? The gastric ulcer biopsies show evidence of active inflammation and ulceration but no evidence of H. pylori infection or precancerous changes.   The random stomach biopsies showed reactive changes and gastritis but no evidence of H. pylori at or precancerous changes.   The random small intestine biopsies showed no evidence of celiac disease or precancerous changes.   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  This means that it has the potential to develop into cancer over time if it is left in place and not removed.   I would like to ensure that your stomach ulcer has healed in the coming months but also attempt resection of the duodenal polyp if you agree.   We will set up a follow-up in clinic in the next few months to discuss things in greater detail and plan a repeat upper endoscopy in 3 to 4 months in the hospital-based outpatient setting.   You have been scheduled for a follow up with Dr. Meridee Score on Wednesday, 07/10/23 at 3:30 pm.    Please call us at [phone] if you have persistent problems or have questions about your condition that have not been fully answered at this time.   Sincerely,   Lemar Lofty., MD

## 2023-04-29 DIAGNOSIS — B351 Tinea unguium: Secondary | ICD-10-CM | POA: Diagnosis not present

## 2023-04-29 DIAGNOSIS — M19071 Primary osteoarthritis, right ankle and foot: Secondary | ICD-10-CM | POA: Diagnosis not present

## 2023-05-21 ENCOUNTER — Other Ambulatory Visit: Payer: Self-pay | Admitting: Gastroenterology

## 2023-05-21 DIAGNOSIS — K259 Gastric ulcer, unspecified as acute or chronic, without hemorrhage or perforation: Secondary | ICD-10-CM

## 2023-05-22 ENCOUNTER — Other Ambulatory Visit: Payer: Self-pay | Admitting: Gastroenterology

## 2023-05-22 ENCOUNTER — Other Ambulatory Visit: Payer: Self-pay

## 2023-05-22 DIAGNOSIS — K259 Gastric ulcer, unspecified as acute or chronic, without hemorrhage or perforation: Secondary | ICD-10-CM

## 2023-05-22 MED ORDER — ESOMEPRAZOLE MAGNESIUM 40 MG PO CPDR
40.0000 mg | DELAYED_RELEASE_CAPSULE | Freq: Two times a day (BID) | ORAL | 1 refills | Status: DC
Start: 2023-05-22 — End: 2023-07-15

## 2023-05-22 NOTE — Telephone Encounter (Signed)
PT is calling to get a refill on carafate. Please advise.

## 2023-05-22 NOTE — Telephone Encounter (Signed)
Refill for Nexium sent to local pharmacy.

## 2023-06-04 ENCOUNTER — Other Ambulatory Visit: Payer: Self-pay | Admitting: Gastroenterology

## 2023-06-04 DIAGNOSIS — K259 Gastric ulcer, unspecified as acute or chronic, without hemorrhage or perforation: Secondary | ICD-10-CM

## 2023-07-10 ENCOUNTER — Ambulatory Visit: Payer: Medicare Other | Admitting: Gastroenterology

## 2023-07-10 ENCOUNTER — Encounter: Payer: Self-pay | Admitting: Gastroenterology

## 2023-07-10 VITALS — BP 120/70 | HR 73 | Ht 64.0 in | Wt 145.0 lb

## 2023-07-10 DIAGNOSIS — K259 Gastric ulcer, unspecified as acute or chronic, without hemorrhage or perforation: Secondary | ICD-10-CM

## 2023-07-10 DIAGNOSIS — D132 Benign neoplasm of duodenum: Secondary | ICD-10-CM | POA: Diagnosis not present

## 2023-07-10 DIAGNOSIS — R1010 Upper abdominal pain, unspecified: Secondary | ICD-10-CM | POA: Diagnosis not present

## 2023-07-10 DIAGNOSIS — K279 Peptic ulcer, site unspecified, unspecified as acute or chronic, without hemorrhage or perforation: Secondary | ICD-10-CM

## 2023-07-10 DIAGNOSIS — K257 Chronic gastric ulcer without hemorrhage or perforation: Secondary | ICD-10-CM

## 2023-07-10 NOTE — Progress Notes (Unsigned)
GASTROENTEROLOGY OUTPATIENT CLINIC VISIT   Primary Care Provider Irena Reichmann, DO [address] [place] [address] [place] [address] [phone]  Referring Provider Irena Reichmann, DO [address] [place],  [place] [address] [phone]  Patient Profile: Julie [name] is a 88 y.o. female with a pmh significant for  The patient presents to the [facility] for an evaluation and management of problem(s) noted below:  Problem List No diagnosis found.  History of Present Illness    The patient does/does not take NSAIDs or BC/Goody Powder. Patient has/has not had an EGD. Patient has/has not had a Colonoscopy.  GI Review of Systems Positive as above Negative for  Pyrosis; Reflux; Regurgitation; Dysphagia; Odynophagia; Globus; Post-prandial cough; Nocturnal cough; Nasal regurgitation; Epigastric pain; Nausea; Vomiting; Hematemesis; Jaundice; Change in Appetite; Early satiety; Abdominal pain; Abdominal bloating; Eructation; Flatulence; Change in BM Frequency; Change in BM Consistency; Constipation; Diarrhea; Incontinence; Urgency; Tenesmus; Hematochezia; Melena  Review of Systems General: Denies fevers/chills/weight loss/night sweats HEENT: Denies oral lesions/sore throat/headaches/visual changes Cardiovascular: Denies chest pain/palpitations Pulmonary: Denies shortness of breath/cough Gastroenterological: See HPI Genitourinary: Denies darkened urine or hematuria Hematological: Denies easy bruising/bleeding Endocrine: Denies temperature intolerance Dermatological: Denies skin changes Psychological: Mood is stable Allergy & Immunology: Denies severe allergic reactions Musculoskeletal: Denies new arthralgias   Medications Current Outpatient Medications  Medication Sig Dispense Refill   acetaminophen (TYLENOL) 650 MG CR tablet Take 650 mg by mouth 2 (two) times daily as needed for pain.     amLODipine (NORVASC) 10 MG tablet Take 10 mg by  mouth daily.     carvedilol (COREG) 3.125 MG tablet Take 3.125 mg by mouth 2 (two) times daily.     Cholecalciferol (VITAMIN D-3) 25 MCG (1000 UT) CAPS Take 1,000 Units by mouth daily.     colchicine 0.6 MG tablet Take 0.6 mg by mouth daily as needed (gout). (Patient not taking: Reported on 03/01/2023)     esomeprazole (NEXIUM) 40 MG capsule Take 1 capsule (40 mg total) by mouth 2 (two) times daily before a meal. 180 capsule 1   ferrous sulfate 325 (65 FE) MG tablet Take 1 tablet (325 mg total) by mouth daily. 30 tablet 3   L-Methylfolate-Algae-B12-B6 (METANX) 3-90.314-2-35 MG CAPS Take 1 tablet by mouth in the morning and at bedtime.     levothyroxine (SYNTHROID, LEVOTHROID) 50 MCG tablet Take 50 mcg by mouth daily before breakfast. (Patient not taking: Reported on 03/01/2023)     LORazepam (ATIVAN) 1 MG tablet Take 1 mg by mouth 3 (three) times daily as needed (for vertigo).     ondansetron (ZOFRAN) 8 MG tablet Take 8 mg by mouth every 8 (eight) hours as needed for nausea or vomiting.     ONETOUCH ULTRA test strip USE SUBCUTANEOUSLY TO CHECK BLOOD SUGAR TWICE DAILY for 90     senna (SENOKOT) 8.6 MG TABS tablet Take 2 tablets by mouth daily.     sucralfate (CARAFATE) 1 g tablet TAKE 1 TABLET BY MOUTH TWICE  DAILY 60 tablet 11   tiZANidine (ZANAFLEX) 4 MG tablet Take 4 mg by mouth every 8 (eight) hours as needed for muscle spasms.     traMADol (ULTRAM) 50 MG tablet Take 50 mg by mouth 2 (two) times daily as needed for moderate pain. Maximum dose= 8 tablets per day pain     TURMERIC CURCUMIN PO Take 600 mg by mouth daily.     No current facility-administered medications for this visit.    Allergies Allergies  Allergen Reactions   Ace Inhibitors Swelling and Other (See Comments)    Angioedemia  Lisinopril    Advair Hfa [Fluticasone-Salmeterol] Other (See Comments)    Blurred vision    Amrix [Cyclobenzaprine Hcl] Other (See Comments)    Abnormal LFT(s)   Aspirin Other (See Comments)     Bleeding    Nsaids Other (See Comments)    Bleeding   Pregabalin Other (See Comments)    Patient did not want to experience possible side effects   Zocor [Simvastatin] Other (See Comments)    Muscle pain   Nifedipine Anxiety and Other (See Comments)    Nervousness    Histories Past Medical History:  Diagnosis Date   Anemia    Asthma    Diabetes (HCC)    GERD (gastroesophageal reflux disease)    Gout    Heart palpitations    Hypertension    Hypothyroidism    Peptic ulcer    Past Surgical History:  Procedure Laterality Date   BIOPSY  12/17/2021   Procedure: BIOPSY;  Surgeon: Rachael Fee, MD;  Location: [facility];  Service: Gastroenterology;;   BIOPSY  05/03/2022   Procedure: BIOPSY;  Surgeon: Rachael Fee, MD;  Location: [facility];  Service: Gastroenterology;;   Arbutus Leas Bilateral    COLONOSCOPY WITH PROPOFOL N/A 12/17/2021   Procedure: COLONOSCOPY WITH PROPOFOL;  Surgeon: Rachael Fee, MD;  Location: [facility];  Service: Gastroenterology;  Laterality: N/A;   ESOPHAGOGASTRODUODENOSCOPY (EGD) WITH PROPOFOL N/A 12/17/2021   Procedure: ESOPHAGOGASTRODUODENOSCOPY (EGD) WITH PROPOFOL;  Surgeon: Rachael Fee, MD;  Location: [facility];  Service: Gastroenterology;  Laterality: N/A;   ESOPHAGOGASTRODUODENOSCOPY (EGD) WITH PROPOFOL N/A 05/03/2022   Procedure: ESOPHAGOGASTRODUODENOSCOPY (EGD) WITH PROPOFOL;  Surgeon: Rachael Fee, MD;  Location: [facility];  Service: Gastroenterology;  Laterality: N/A;   HEMORRHOID SURGERY     OVARIAN CYST REMOVAL Left    THROAT SURGERY     due to not being able to swallow   THYROIDECTOMY     UPPER GASTROINTESTINAL ENDOSCOPY     Social History   Socioeconomic History   Marital status: Married    Spouse name: Not on file   Number of children: 0   Years of education: Not on file   Highest education level: Not on file  Occupational History   Occupation: retired  Tobacco Use   Smoking status: Former    Smokeless tobacco: Never  Advertising account planner   Vaping status: Never Used  Substance and Sexual Activity   Alcohol use: No   Drug use: No   Sexual activity: Never  Other Topics Concern   Not on file  Social History Narrative   Right handed   Lives in 3 story building   Drinks caffeine   Social Determinants of Health   Financial Resource Strain: Not on file  Food Insecurity: Not on file  Transportation Needs: Not on file  Physical Activity: Not on file  Stress: Not on file  Social Connections: Not on file  Intimate Partner Violence: Not on file   Family History  Problem Relation Age of Onset   Diabetes Mother    Diabetes Sister    Colon cancer Neg Hx    Colon polyps Neg Hx    Rectal cancer Neg Hx    Stomach cancer Neg Hx    Esophageal cancer Neg Hx    I have reviewed her medical, social, and family history in detail and updated the electronic medical record as necessary.  PHYSICAL EXAMINATION  Ht 5\' 4"  (1.626 m)   Wt 147 lb (66.7 kg)   BMI 25.23 kg/m  Wt Readings from Last 3 Encounters:  07/10/23 147 lb (66.7 kg)  03/29/23 147 lb (66.7 kg)  03/01/23 147 lb (66.7 kg)   GEN: NAD, appears stated age, doesn't appear chronically ill PSYCH: Cooperative, without pressured speech EYE: Conjunctivae pink, sclerae anicteric ENT: MMM, without oral ulcers, no erythema or exudates noted NECK: Supple CV: RR without R/Gs  RESP: CTAB posteriorly, without wheezing GI: NABS, soft, NT/ND, without rebound or guarding, no HSM appreciated GU: DRE shows MSK/EXT: _ edema, no palmar erythema SKIN: No jaundice, no spider angiomata, no concerning rashes NEURO:  Alert & Oriented x 3, no focal deficits, no evidence of asterixis   REVIEW OF DATA  I reviewed the following data at the time of this encounter:  GI Procedures and Studies  June 2024 EGD - No gross lesions in the entire esophagus. Z-line irregular, 35 cm from the incisors. - 4 cm hiatal hernia.  - Scar in the gastric antrum. -  Non-bleeding gastric ulcer with a clean ulcer base (Forrest Class III) in the antrum. Biopsied. - Erythematous mucosa in the stomach. Biopsied. - A single duodenal polyp in D3. Biopsied. Tattooed proximally. - No other gross lesions in the duodenal bulb, in the first portion of the duodenum, in the second portion of the duodenum and in the third portion of the duodenum. Biopsied.  Laboratory Studies    Imaging Studies  ***   ASSESSMENT  Ms. Prak is a 88 y.o. female with a pmh significant for The patient is seen today for evaluation and management of:  No diagnosis found.  ***   PLAN  There are no diagnoses linked to this encounter.   No orders of the defined types were placed in this encounter.   New Prescriptions   No medications on file   Modified Medications   No medications on file    Planned Follow Up No follow-ups on file.   Total Time in Face-to-Face and in Coordination of Care for patient including independent/personal interpretation/review of prior testing, medical history, examination, medication adjustment, communicating results with the patient directly, and documentation within the EHR is ***.   Corliss Parish, MD [facility] Office # [phone]

## 2023-07-10 NOTE — Patient Instructions (Signed)
Continue Carafate until you finish prescription.   Continue Nexium twice daily through 08/01/23. Then take once daily.   _______________________________________________________  If your blood pressure at your visit was 140/90 or greater, please contact your primary care physician to follow up on this.  _______________________________________________________  If you are age 85 or older, your body mass index should be between 23-30. Your Body mass index is 24.89 kg/m. If this is out of the aforementioned range listed, please consider follow up with your Primary Care Provider.  If you are age 60 or younger, your body mass index should be between 19-25. Your Body mass index is 24.89 kg/m. If this is out of the aformentioned range listed, please consider follow up with your Primary Care Provider.   ________________________________________________________  The [facility] providers would like to encourage you to use [facility] to communicate with providers for non-urgent requests or questions.  Due to long hold times on the telephone, sending your provider a message by [facility] may be a faster and more efficient way to get a response.  Please allow 48 business hours for a response.  Please remember that this is for non-urgent requests.  _______________________________________________________  Thank you for choosing me and [facility].  Dr. Meridee Score

## 2023-07-11 ENCOUNTER — Encounter: Payer: Self-pay | Admitting: Gastroenterology

## 2023-07-11 DIAGNOSIS — R1010 Upper abdominal pain, unspecified: Secondary | ICD-10-CM | POA: Insufficient documentation

## 2023-07-11 DIAGNOSIS — K279 Peptic ulcer, site unspecified, unspecified as acute or chronic, without hemorrhage or perforation: Secondary | ICD-10-CM | POA: Insufficient documentation

## 2023-07-11 DIAGNOSIS — D132 Benign neoplasm of duodenum: Secondary | ICD-10-CM | POA: Insufficient documentation

## 2023-07-12 DIAGNOSIS — E039 Hypothyroidism, unspecified: Secondary | ICD-10-CM | POA: Diagnosis not present

## 2023-07-12 DIAGNOSIS — Z862 Personal history of diseases of the blood and blood-forming organs and certain disorders involving the immune mechanism: Secondary | ICD-10-CM | POA: Diagnosis not present

## 2023-07-12 DIAGNOSIS — I129 Hypertensive chronic kidney disease with stage 1 through stage 4 chronic kidney disease, or unspecified chronic kidney disease: Secondary | ICD-10-CM | POA: Diagnosis not present

## 2023-07-12 DIAGNOSIS — E1122 Type 2 diabetes mellitus with diabetic chronic kidney disease: Secondary | ICD-10-CM | POA: Diagnosis not present

## 2023-07-15 ENCOUNTER — Telehealth: Payer: Self-pay | Admitting: Gastroenterology

## 2023-07-15 DIAGNOSIS — K259 Gastric ulcer, unspecified as acute or chronic, without hemorrhage or perforation: Secondary | ICD-10-CM

## 2023-07-15 MED ORDER — ESOMEPRAZOLE MAGNESIUM 40 MG PO CPDR: 40.0000 mg | DELAYED_RELEASE_CAPSULE | Freq: Two times a day (BID) | ORAL | 2 refills | Status: DC

## 2023-07-15 NOTE — Telephone Encounter (Signed)
Refill for Nexium has been sent to [facility].

## 2023-07-15 NOTE — Telephone Encounter (Signed)
Inbound call from is requesting a refill for nexium sent to [facility].

## 2023-07-18 NOTE — Telephone Encounter (Signed)
Patient requesting medication refill for sulfate. Please advise.

## 2023-07-19 NOTE — Telephone Encounter (Signed)
Returned call to patient. Informed her that she discussed with Dr Meridee Score discontinuing Carafate after completion of her current prescription. Patient states that she was confused, but does recall having this discussion with Dr. Meridee Score. Patient voiced understanding.  Refill will not be sent at this time. Patient will let us know if she needs [place].

## 2023-07-22 DIAGNOSIS — M109 Gout, unspecified: Secondary | ICD-10-CM | POA: Diagnosis not present

## 2023-07-22 DIAGNOSIS — Z23 Encounter for immunization: Secondary | ICD-10-CM | POA: Diagnosis not present

## 2023-07-22 DIAGNOSIS — E1122 Type 2 diabetes mellitus with diabetic chronic kidney disease: Secondary | ICD-10-CM | POA: Diagnosis not present

## 2023-07-22 DIAGNOSIS — I129 Hypertensive chronic kidney disease with stage 1 through stage 4 chronic kidney disease, or unspecified chronic kidney disease: Secondary | ICD-10-CM | POA: Diagnosis not present

## 2023-07-22 DIAGNOSIS — E039 Hypothyroidism, unspecified: Secondary | ICD-10-CM | POA: Diagnosis not present

## 2023-07-22 DIAGNOSIS — N1832 Chronic kidney disease, stage 3b: Secondary | ICD-10-CM | POA: Diagnosis not present

## 2023-07-22 DIAGNOSIS — Z0000 Encounter for general adult medical examination without abnormal findings: Secondary | ICD-10-CM | POA: Diagnosis not present

## 2023-07-22 DIAGNOSIS — E7800 Pure hypercholesterolemia, unspecified: Secondary | ICD-10-CM | POA: Diagnosis not present

## 2023-07-22 DIAGNOSIS — M1711 Unilateral primary osteoarthritis, right knee: Secondary | ICD-10-CM | POA: Diagnosis not present

## 2023-07-22 DIAGNOSIS — M1990 Unspecified osteoarthritis, unspecified site: Secondary | ICD-10-CM | POA: Diagnosis not present

## 2023-07-22 DIAGNOSIS — Z862 Personal history of diseases of the blood and blood-forming organs and certain disorders involving the immune mechanism: Secondary | ICD-10-CM | POA: Diagnosis not present

## 2023-12-26 DIAGNOSIS — E7800 Pure hypercholesterolemia, unspecified: Secondary | ICD-10-CM | POA: Diagnosis not present

## 2023-12-26 DIAGNOSIS — I129 Hypertensive chronic kidney disease with stage 1 through stage 4 chronic kidney disease, or unspecified chronic kidney disease: Secondary | ICD-10-CM | POA: Diagnosis not present

## 2023-12-26 DIAGNOSIS — E1122 Type 2 diabetes mellitus with diabetic chronic kidney disease: Secondary | ICD-10-CM | POA: Diagnosis not present

## 2023-12-26 DIAGNOSIS — Z862 Personal history of diseases of the blood and blood-forming organs and certain disorders involving the immune mechanism: Secondary | ICD-10-CM | POA: Diagnosis not present

## 2023-12-26 DIAGNOSIS — N1832 Chronic kidney disease, stage 3b: Secondary | ICD-10-CM | POA: Diagnosis not present

## 2024-01-02 DIAGNOSIS — E119 Type 2 diabetes mellitus without complications: Secondary | ICD-10-CM | POA: Diagnosis not present

## 2024-01-02 DIAGNOSIS — M109 Gout, unspecified: Secondary | ICD-10-CM | POA: Diagnosis not present

## 2024-01-02 DIAGNOSIS — Z8711 Personal history of peptic ulcer disease: Secondary | ICD-10-CM | POA: Diagnosis not present

## 2024-01-02 DIAGNOSIS — N1832 Chronic kidney disease, stage 3b: Secondary | ICD-10-CM | POA: Diagnosis not present

## 2024-01-02 DIAGNOSIS — I129 Hypertensive chronic kidney disease with stage 1 through stage 4 chronic kidney disease, or unspecified chronic kidney disease: Secondary | ICD-10-CM | POA: Diagnosis not present

## 2024-01-02 DIAGNOSIS — E039 Hypothyroidism, unspecified: Secondary | ICD-10-CM | POA: Diagnosis not present

## 2024-01-02 DIAGNOSIS — R2681 Unsteadiness on feet: Secondary | ICD-10-CM | POA: Diagnosis not present

## 2024-01-02 DIAGNOSIS — M5136 Other intervertebral disc degeneration, lumbar region with discogenic back pain only: Secondary | ICD-10-CM | POA: Diagnosis not present

## 2024-01-02 DIAGNOSIS — Z862 Personal history of diseases of the blood and blood-forming organs and certain disorders involving the immune mechanism: Secondary | ICD-10-CM | POA: Diagnosis not present

## 2024-04-06 ENCOUNTER — Encounter (HOSPITAL_COMMUNITY): Payer: Self-pay

## 2024-04-06 ENCOUNTER — Emergency Department (HOSPITAL_COMMUNITY)

## 2024-04-06 ENCOUNTER — Other Ambulatory Visit: Payer: Self-pay

## 2024-04-06 ENCOUNTER — Inpatient Hospital Stay (HOSPITAL_COMMUNITY)
Admission: EM | Admit: 2024-04-06 | Discharge: 2024-04-17 | DRG: 176 | Disposition: A | Discharge status: Skilled Nursing Facility | Attending: Internal Medicine | Admitting: Internal Medicine

## 2024-04-06 DIAGNOSIS — I82462 Acute embolism and thrombosis of left calf muscular vein: Secondary | ICD-10-CM | POA: Diagnosis present

## 2024-04-06 DIAGNOSIS — M7981 Nontraumatic hematoma of soft tissue: Secondary | ICD-10-CM | POA: Diagnosis present

## 2024-04-06 DIAGNOSIS — E1151 Type 2 diabetes mellitus with diabetic peripheral angiopathy without gangrene: Secondary | ICD-10-CM | POA: Diagnosis present

## 2024-04-06 DIAGNOSIS — M79605 Pain in left leg: Secondary | ICD-10-CM | POA: Diagnosis not present

## 2024-04-06 DIAGNOSIS — S7012XA Contusion of left thigh, initial encounter: Secondary | ICD-10-CM

## 2024-04-06 DIAGNOSIS — I10 Essential (primary) hypertension: Secondary | ICD-10-CM | POA: Diagnosis present

## 2024-04-06 DIAGNOSIS — K219 Gastro-esophageal reflux disease without esophagitis: Secondary | ICD-10-CM | POA: Diagnosis not present

## 2024-04-06 DIAGNOSIS — N1832 Chronic kidney disease, stage 3b: Secondary | ICD-10-CM | POA: Diagnosis not present

## 2024-04-06 DIAGNOSIS — Z7401 Bed confinement status: Secondary | ICD-10-CM

## 2024-04-06 DIAGNOSIS — Z79899 Other long term (current) drug therapy: Secondary | ICD-10-CM | POA: Diagnosis not present

## 2024-04-06 DIAGNOSIS — I2699 Other pulmonary embolism without acute cor pulmonale: Principal | ICD-10-CM | POA: Diagnosis present

## 2024-04-06 DIAGNOSIS — R110 Nausea: Secondary | ICD-10-CM | POA: Diagnosis not present

## 2024-04-06 DIAGNOSIS — N1411 Contrast-induced nephropathy: Secondary | ICD-10-CM | POA: Diagnosis present

## 2024-04-06 DIAGNOSIS — S8012XA Contusion of left lower leg, initial encounter: Secondary | ICD-10-CM

## 2024-04-06 DIAGNOSIS — Z9889 Other specified postprocedural states: Secondary | ICD-10-CM | POA: Diagnosis not present

## 2024-04-06 DIAGNOSIS — Z79890 Hormone replacement therapy (postmenopausal): Secondary | ICD-10-CM

## 2024-04-06 DIAGNOSIS — J45909 Unspecified asthma, uncomplicated: Secondary | ICD-10-CM | POA: Diagnosis not present

## 2024-04-06 DIAGNOSIS — D649 Anemia, unspecified: Secondary | ICD-10-CM | POA: Diagnosis not present

## 2024-04-06 DIAGNOSIS — I739 Peripheral vascular disease, unspecified: Secondary | ICD-10-CM | POA: Diagnosis not present

## 2024-04-06 DIAGNOSIS — S301XXA Contusion of abdominal wall, initial encounter: Secondary | ICD-10-CM | POA: Diagnosis not present

## 2024-04-06 DIAGNOSIS — I2602 Saddle embolus of pulmonary artery with acute cor pulmonale: Secondary | ICD-10-CM | POA: Diagnosis not present

## 2024-04-06 DIAGNOSIS — E039 Hypothyroidism, unspecified: Secondary | ICD-10-CM | POA: Diagnosis not present

## 2024-04-06 DIAGNOSIS — E875 Hyperkalemia: Secondary | ICD-10-CM | POA: Diagnosis not present

## 2024-04-06 DIAGNOSIS — S8010XA Contusion of unspecified lower leg, initial encounter: Secondary | ICD-10-CM | POA: Diagnosis present

## 2024-04-06 DIAGNOSIS — E1140 Type 2 diabetes mellitus with diabetic neuropathy, unspecified: Secondary | ICD-10-CM | POA: Diagnosis present

## 2024-04-06 DIAGNOSIS — I82442 Acute embolism and thrombosis of left tibial vein: Secondary | ICD-10-CM | POA: Diagnosis not present

## 2024-04-06 DIAGNOSIS — Z833 Family history of diabetes mellitus: Secondary | ICD-10-CM

## 2024-04-06 DIAGNOSIS — R112 Nausea with vomiting, unspecified: Secondary | ICD-10-CM | POA: Diagnosis not present

## 2024-04-06 DIAGNOSIS — I998 Other disorder of circulatory system: Secondary | ICD-10-CM | POA: Diagnosis not present

## 2024-04-06 DIAGNOSIS — D72829 Elevated white blood cell count, unspecified: Secondary | ICD-10-CM | POA: Diagnosis present

## 2024-04-06 DIAGNOSIS — R231 Pallor: Secondary | ICD-10-CM | POA: Diagnosis not present

## 2024-04-06 DIAGNOSIS — Z66 Do not resuscitate: Secondary | ICD-10-CM | POA: Diagnosis present

## 2024-04-06 DIAGNOSIS — I82432 Acute embolism and thrombosis of left popliteal vein: Secondary | ICD-10-CM | POA: Diagnosis not present

## 2024-04-06 DIAGNOSIS — Z886 Allergy status to analgesic agent status: Secondary | ICD-10-CM

## 2024-04-06 DIAGNOSIS — S301XXS Contusion of abdominal wall, sequela: Secondary | ICD-10-CM | POA: Diagnosis not present

## 2024-04-06 DIAGNOSIS — Z95828 Presence of other vascular implants and grafts: Secondary | ICD-10-CM | POA: Diagnosis not present

## 2024-04-06 DIAGNOSIS — K259 Gastric ulcer, unspecified as acute or chronic, without hemorrhage or perforation: Secondary | ICD-10-CM | POA: Diagnosis not present

## 2024-04-06 DIAGNOSIS — K5900 Constipation, unspecified: Secondary | ICD-10-CM | POA: Diagnosis present

## 2024-04-06 DIAGNOSIS — Z888 Allergy status to other drugs, medicaments and biological substances status: Secondary | ICD-10-CM

## 2024-04-06 DIAGNOSIS — D62 Acute posthemorrhagic anemia: Secondary | ICD-10-CM | POA: Diagnosis not present

## 2024-04-06 DIAGNOSIS — I959 Hypotension, unspecified: Secondary | ICD-10-CM | POA: Diagnosis present

## 2024-04-06 DIAGNOSIS — Z8711 Personal history of peptic ulcer disease: Secondary | ICD-10-CM

## 2024-04-06 DIAGNOSIS — M6281 Muscle weakness (generalized): Secondary | ICD-10-CM | POA: Diagnosis not present

## 2024-04-06 DIAGNOSIS — E872 Acidosis, unspecified: Secondary | ICD-10-CM | POA: Diagnosis not present

## 2024-04-06 DIAGNOSIS — S7012XS Contusion of left thigh, sequela: Secondary | ICD-10-CM | POA: Diagnosis not present

## 2024-04-06 DIAGNOSIS — E890 Postprocedural hypothyroidism: Secondary | ICD-10-CM | POA: Diagnosis present

## 2024-04-06 DIAGNOSIS — I129 Hypertensive chronic kidney disease with stage 1 through stage 4 chronic kidney disease, or unspecified chronic kidney disease: Secondary | ICD-10-CM | POA: Diagnosis present

## 2024-04-06 DIAGNOSIS — I70203 Unspecified atherosclerosis of native arteries of extremities, bilateral legs: Secondary | ICD-10-CM | POA: Diagnosis not present

## 2024-04-06 DIAGNOSIS — R932 Abnormal findings on diagnostic imaging of liver and biliary tract: Secondary | ICD-10-CM | POA: Diagnosis not present

## 2024-04-06 DIAGNOSIS — R609 Edema, unspecified: Secondary | ICD-10-CM | POA: Diagnosis not present

## 2024-04-06 DIAGNOSIS — T508X5A Adverse effect of diagnostic agents, initial encounter: Secondary | ICD-10-CM | POA: Diagnosis not present

## 2024-04-06 DIAGNOSIS — Z743 Need for continuous supervision: Secondary | ICD-10-CM | POA: Diagnosis not present

## 2024-04-06 DIAGNOSIS — I82401 Acute embolism and thrombosis of unspecified deep veins of right lower extremity: Secondary | ICD-10-CM | POA: Diagnosis not present

## 2024-04-06 DIAGNOSIS — I82412 Acute embolism and thrombosis of left femoral vein: Secondary | ICD-10-CM | POA: Diagnosis present

## 2024-04-06 DIAGNOSIS — E1122 Type 2 diabetes mellitus with diabetic chronic kidney disease: Secondary | ICD-10-CM | POA: Diagnosis present

## 2024-04-06 DIAGNOSIS — R61 Generalized hyperhidrosis: Secondary | ICD-10-CM | POA: Diagnosis not present

## 2024-04-06 DIAGNOSIS — E119 Type 2 diabetes mellitus without complications: Secondary | ICD-10-CM | POA: Diagnosis not present

## 2024-04-06 DIAGNOSIS — Z87891 Personal history of nicotine dependence: Secondary | ICD-10-CM

## 2024-04-06 DIAGNOSIS — R404 Transient alteration of awareness: Secondary | ICD-10-CM | POA: Diagnosis not present

## 2024-04-06 DIAGNOSIS — R531 Weakness: Secondary | ICD-10-CM | POA: Diagnosis not present

## 2024-04-06 DIAGNOSIS — R2689 Other abnormalities of gait and mobility: Secondary | ICD-10-CM | POA: Diagnosis not present

## 2024-04-06 DIAGNOSIS — K279 Peptic ulcer, site unspecified, unspecified as acute or chronic, without hemorrhage or perforation: Secondary | ICD-10-CM | POA: Diagnosis not present

## 2024-04-06 DIAGNOSIS — N281 Cyst of kidney, acquired: Secondary | ICD-10-CM | POA: Diagnosis not present

## 2024-04-06 DIAGNOSIS — I7090 Unspecified atherosclerosis: Secondary | ICD-10-CM | POA: Diagnosis not present

## 2024-04-06 DIAGNOSIS — N179 Acute kidney failure, unspecified: Secondary | ICD-10-CM | POA: Diagnosis not present

## 2024-04-06 LAB — LACTIC ACID, PLASMA
Lactic Acid, Venous: 6.1 mmol/L (ref 0.5–1.9)
Lactic Acid, Venous: 5.2 mmol/L (ref 0.5–1.9)
Lactic Acid, Venous: 2.5 mmol/L (ref 0.5–1.9)
Lactic Acid, Venous: 3.3 mmol/L (ref 0.5–1.9)

## 2024-04-06 LAB — ECHOCARDIOGRAM COMPLETE
Area-P 1/2: 3.3 cm2
Est EF: >75
Height: 64 in
Weight: 2328.06 [oz_av]

## 2024-04-06 LAB — CBC
HCT: 27 % — ABNORMAL LOW (ref 36.0–46.0)
HCT: 23.6 % — ABNORMAL LOW (ref 36.0–46.0)
Hemoglobin: 8.6 g/dL — ABNORMAL LOW (ref 12.0–15.0)
Hemoglobin: 7.6 g/dL — ABNORMAL LOW (ref 12.0–15.0)
MCH: 30.9 pg (ref 26.0–34.0)
MCH: 30.5 pg (ref 26.0–34.0)
MCHC: 31.9 g/dL (ref 30.0–36.0)
MCHC: 32.2 g/dL (ref 30.0–36.0)
MCV: 97.1 fL (ref 80.0–100.0)
MCV: 94.8 fL (ref 80.0–100.0)
Platelets: 180 10*3/uL (ref 150–400)
Platelets: 168 10*3/uL (ref 150–400)
RBC: 2.78 MIL/uL — ABNORMAL LOW (ref 3.87–5.11)
RBC: 2.49 MIL/uL — ABNORMAL LOW (ref 3.87–5.11)
RDW: 14.2 % (ref 11.5–15.5)
RDW: 14.4 % (ref 11.5–15.5)
WBC: 16.5 10*3/uL — ABNORMAL HIGH (ref 4.0–10.5)
WBC: 14.5 10*3/uL — ABNORMAL HIGH (ref 4.0–10.5)
nRBC: 0.2 % (ref 0.0–0.2)
nRBC: 0.3 % — ABNORMAL HIGH (ref 0.0–0.2)

## 2024-04-06 LAB — CBC WITH DIFFERENTIAL/PLATELET
Abs Immature Granulocytes: 0.22 10*3/uL — ABNORMAL HIGH (ref 0.00–0.07)
Basophils Absolute: 0.1 10*3/uL (ref 0.0–0.1)
Basophils Relative: 1 %
Eosinophils Absolute: 0 10*3/uL (ref 0.0–0.5)
Eosinophils Relative: 0 %
HCT: 31.4 % — ABNORMAL LOW (ref 36.0–46.0)
Hemoglobin: 10 g/dL — ABNORMAL LOW (ref 12.0–15.0)
Immature Granulocytes: 2 %
Lymphocytes Relative: 37 %
Lymphs Abs: 4.2 10*3/uL — ABNORMAL HIGH (ref 0.7–4.0)
MCH: 30.4 pg (ref 26.0–34.0)
MCHC: 31.8 g/dL (ref 30.0–36.0)
MCV: 95.4 fL (ref 80.0–100.0)
Monocytes Absolute: 0.8 10*3/uL (ref 0.1–1.0)
Monocytes Relative: 7 %
Neutro Abs: 6 10*3/uL (ref 1.7–7.7)
Neutrophils Relative %: 53 %
Platelets: 212 10*3/uL (ref 150–400)
RBC: 3.29 MIL/uL — ABNORMAL LOW (ref 3.87–5.11)
RDW: 13.9 % (ref 11.5–15.5)
WBC: 11.2 10*3/uL — ABNORMAL HIGH (ref 4.0–10.5)
nRBC: 0 % (ref 0.0–0.2)

## 2024-04-06 LAB — PROTIME-INR
INR: 1.1 (ref 0.8–1.2)
Prothrombin Time: 15 s (ref 11.4–15.2)

## 2024-04-06 LAB — I-STAT CHEM 8, ED
BUN: 31 mg/dL — ABNORMAL HIGH (ref 8–23)
Calcium, Ion: 1.12 mmol/L — ABNORMAL LOW (ref 1.15–1.40)
Chloride: 111 mmol/L (ref 98–111)
Creatinine, Ser: 1.8 mg/dL — ABNORMAL HIGH (ref 0.44–1.00)
Glucose, Bld: 196 mg/dL — ABNORMAL HIGH (ref 70–99)
HCT: 29 % — ABNORMAL LOW (ref 36.0–46.0)
Hemoglobin: 9.9 g/dL — ABNORMAL LOW (ref 12.0–15.0)
Potassium: 4.8 mmol/L (ref 3.5–5.1)
Sodium: 140 mmol/L (ref 135–145)
TCO2: 20 mmol/L — ABNORMAL LOW (ref 22–32)

## 2024-04-06 LAB — BASIC METABOLIC PANEL WITH GFR
Anion gap: 8 (ref 5–15)
BUN: 23 mg/dL (ref 8–23)
CO2: 15 mmol/L — ABNORMAL LOW (ref 22–32)
Calcium: 6.4 mg/dL — CL (ref 8.9–10.3)
Chloride: 121 mmol/L — ABNORMAL HIGH (ref 98–111)
Creatinine, Ser: 1.25 mg/dL — ABNORMAL HIGH (ref 0.44–1.00)
GFR, Estimated: 42 mL/min — ABNORMAL LOW (ref >60)
Glucose, Bld: 155 mg/dL — ABNORMAL HIGH (ref 70–99)
Potassium: 3.6 mmol/L (ref 3.5–5.1)
Sodium: 144 mmol/L (ref 135–145)

## 2024-04-06 LAB — I-STAT CG4 LACTIC ACID, ED: Lactic Acid, Venous: 3.1 mmol/L (ref 0.5–1.9)

## 2024-04-06 LAB — DIC (DISSEMINATED INTRAVASCULAR COAGULATION)PANEL
D-Dimer, Quant: >20 ug{FEU}/mL — ABNORMAL HIGH (ref 0.00–0.50)
Fibrinogen: 221 mg/dL (ref 210–475)
INR: 1.5 — ABNORMAL HIGH (ref 0.8–1.2)
Platelets: 157 10*3/uL (ref 150–400)
Prothrombin Time: 18.7 s — ABNORMAL HIGH (ref 11.4–15.2)
Smear Review: NONE SEEN
aPTT: 34 s (ref 24–36)

## 2024-04-06 LAB — TROPONIN I (HIGH SENSITIVITY)
Troponin I (High Sensitivity): 14 ng/L (ref <18)
Troponin I (High Sensitivity): 21 ng/L — ABNORMAL HIGH (ref <18)

## 2024-04-06 LAB — CBG MONITORING, ED: Glucose-Capillary: 95 mg/dL (ref 70–99)

## 2024-04-06 LAB — LIPASE, BLOOD: Lipase: 41 U/L (ref 11–51)

## 2024-04-06 LAB — GLUCOSE, CAPILLARY: Glucose-Capillary: 103 mg/dL — ABNORMAL HIGH (ref 70–99)

## 2024-04-06 LAB — HEMOGLOBIN A1C
Hgb A1c MFr Bld: 5.8 % — ABNORMAL HIGH (ref 4.8–5.6)
Mean Plasma Glucose: 119.76 mg/dL

## 2024-04-06 LAB — CK: Total CK: 54 U/L (ref 38–234)

## 2024-04-06 LAB — APTT: aPTT: 23 s — ABNORMAL LOW (ref 24–36)

## 2024-04-06 MED ORDER — LACTATED RINGERS IV BOLUS
1000.0000 mL | Freq: Once | INTRAVENOUS | Status: AC
  Administered 2024-04-06: 1000 mL via INTRAVENOUS

## 2024-04-06 MED ORDER — LORAZEPAM 1 MG PO TABS
1.0000 mg | ORAL_TABLET | Freq: Three times a day (TID) | ORAL | Status: DC | PRN
  Administered 2024-04-09 – 2024-04-12 (×3): 1 mg via ORAL
  Filled 2024-04-06 (×3): qty 1

## 2024-04-06 MED ORDER — CALCIUM GLUCONATE-NACL 1-0.675 GM/50ML-% IV SOLN
1.0000 g | Freq: Once | INTRAVENOUS | Status: AC
  Administered 2024-04-06: 1000 mg via INTRAVENOUS
  Filled 2024-04-06: qty 50

## 2024-04-06 MED ORDER — SODIUM CHLORIDE 0.9% FLUSH
3.0000 mL | Freq: Two times a day (BID) | INTRAVENOUS | Status: DC
  Administered 2024-04-06 – 2024-04-13 (×12): 3 mL via INTRAVENOUS

## 2024-04-06 MED ORDER — COLCHICINE 0.6 MG PO TABS: 0.6000 mg | ORAL_TABLET | Freq: Every day | ORAL | Status: DC | PRN

## 2024-04-06 MED ORDER — ACETAMINOPHEN 325 MG PO TABS: 650.0000 mg | ORAL_TABLET | Freq: Four times a day (QID) | ORAL | Status: DC | PRN

## 2024-04-06 MED ORDER — ACETAMINOPHEN 650 MG RE SUPP: 650.0000 mg | Freq: Four times a day (QID) | RECTAL | Status: DC | PRN

## 2024-04-06 MED ORDER — PROCHLORPERAZINE EDISYLATE 10 MG/2ML IJ SOLN
10.0000 mg | Freq: Four times a day (QID) | INTRAMUSCULAR | Status: DC | PRN
  Administered 2024-04-06: 10 mg via INTRAVENOUS
  Filled 2024-04-06: qty 2

## 2024-04-06 MED ORDER — ONDANSETRON HCL 4 MG/2ML IJ SOLN
4.0000 mg | Freq: Four times a day (QID) | INTRAMUSCULAR | Status: DC | PRN
  Administered 2024-04-06: 4 mg via INTRAVENOUS
  Filled 2024-04-06: qty 2

## 2024-04-06 MED ORDER — ALBUTEROL SULFATE (2.5 MG/3ML) 0.083% IN NEBU: 2.5000 mg | INHALATION_SOLUTION | Freq: Four times a day (QID) | RESPIRATORY_TRACT | Status: DC | PRN

## 2024-04-06 MED ORDER — DEXTROSE 50 % IV SOLN: 1.0000 | INTRAVENOUS | Status: DC | PRN

## 2024-04-06 MED ORDER — PANTOPRAZOLE SODIUM 40 MG IV SOLR
40.0000 mg | Freq: Every day | INTRAVENOUS | Status: DC
  Administered 2024-04-06: 40 mg via INTRAVENOUS
  Filled 2024-04-06: qty 10

## 2024-04-06 MED ORDER — IOHEXOL 350 MG/ML SOLN
100.0000 mL | Freq: Once | INTRAVENOUS | Status: AC | PRN
  Administered 2024-04-06: 100 mL via INTRAVENOUS

## 2024-04-06 MED ORDER — LACTATED RINGERS IV SOLN: INTRAVENOUS | Status: DC

## 2024-04-06 MED ORDER — TRAMADOL HCL 50 MG PO TABS
50.0000 mg | ORAL_TABLET | Freq: Four times a day (QID) | ORAL | Status: DC | PRN
  Administered 2024-04-08 – 2024-04-12 (×4): 50 mg via ORAL
  Filled 2024-04-06 (×5): qty 1

## 2024-04-06 NOTE — Progress Notes (Signed)
 Echocardiogram 2D Echocardiogram has been performed.  Damien FALCON Tyarra Nolton RDCS 04/06/2024, 8:29 AM

## 2024-04-06 NOTE — ED Notes (Signed)
 Pt place on bedside commode to move bowels.  Pt appeared to have a little bit of a vagal episode.  She never lost consciousness, but she shook for a few seconds and then looked a little spaced out and less responsive. I called  her name and shook her shoulder and she began to speak but wasn't making since.  This lasted for maybe 30 seconds but she continued to appear weak and less alert until she was returned to bed.  She did bear weight and help us  to get her into bed and she was talking normally once in bed.  Pt did have large BM.

## 2024-04-06 NOTE — ED Notes (Signed)
 Called [facility] to add to cardiac monitoring

## 2024-04-06 NOTE — ED Notes (Signed)
 Istat walked to lab with tech present. Other tubes sent to lab via tube station.

## 2024-04-06 NOTE — ED Notes (Addendum)
 Phlebotomy asked to grab labs

## 2024-04-06 NOTE — Consult Note (Signed)
 Hospital Consult    Reason for Consult: Left leg ischemia  MRN #:  [id number]  History of Present Illness: This is a 89 y.o. female who presented to the emergency department after she woke up about 3 AM having severe pain in her left leg.  At that time she was also having numbness and trouble moving the leg.  Per report the ED said that it appeared cyanotic and there was a faint DP on Doppler exam.  The ED reports that after a fluid bolus and improving her blood pressure that the left limb appeared to have better color and also she developed improved motor and sensory. She denies ever having significant foot pain although does have neuropathy.  She denies any previous history of claudication, rest pain or nonhealing wounds.  She does ambulate with a walker.  She lives independently and is very functional.  Past Medical History:  Diagnosis Date   Anemia    Asthma    Diabetes (HCC)    GERD (gastroesophageal reflux disease)    Gout    Heart palpitations    Hypertension    Hypothyroidism    Peptic ulcer     Past Surgical History:  Procedure Laterality Date   BIOPSY  12/17/2021   Procedure: BIOPSY;  Surgeon: Teressa Toribio SQUIBB, MD;  Location: [facility];  Service: Gastroenterology;;   BIOPSY  05/03/2022   Procedure: BIOPSY;  Surgeon: Teressa Toribio SQUIBB, MD;  Location: [facility];  Service: Gastroenterology;;   ROMAYNE Bilateral    COLONOSCOPY WITH PROPOFOL  N/A 12/17/2021   Procedure: COLONOSCOPY WITH PROPOFOL ;  Surgeon: Teressa Toribio SQUIBB, MD;  Location: [facility];  Service: Gastroenterology;  Laterality: N/A;   ESOPHAGOGASTRODUODENOSCOPY (EGD) WITH PROPOFOL  N/A 12/17/2021   Procedure: ESOPHAGOGASTRODUODENOSCOPY (EGD) WITH PROPOFOL ;  Surgeon: Teressa Toribio SQUIBB, MD;  Location: [facility];  Service: Gastroenterology;  Laterality: N/A;   ESOPHAGOGASTRODUODENOSCOPY (EGD) WITH PROPOFOL  N/A 05/03/2022   Procedure: ESOPHAGOGASTRODUODENOSCOPY (EGD) WITH PROPOFOL ;  Surgeon: Teressa Toribio SQUIBB, MD;  Location: [facility];  Service: Gastroenterology;  Laterality: N/A;   HEMORRHOID SURGERY     OVARIAN CYST REMOVAL Left    THROAT SURGERY     due to not being able to swallow   THYROIDECTOMY     UPPER GASTROINTESTINAL ENDOSCOPY      Allergies  Allergen Reactions   Ace Inhibitors Swelling and Other (See Comments)    Angioedemia  Lisinopril    Advair Hfa [Fluticasone-Salmeterol] Other (See Comments)    Blurred vision    Amrix [Cyclobenzaprine Hcl] Other (See Comments)    Abnormal LFT(s)   Aspirin Other (See Comments)    Bleeding    Nsaids Other (See Comments)    Bleeding   Pregabalin Other (See Comments)    Patient did not want to experience possible side effects   Zocor [Simvastatin] Other (See Comments)    Muscle pain   Nifedipine Anxiety and Other (See Comments)    Nervousness    Prior to Admission medications   Medication Sig Start Date End Date Taking? Authorizing Provider  amLODipine  (NORVASC ) 10 MG tablet Take 10 mg by mouth daily.   Yes [provider]  carvedilol  (COREG ) 3.125 MG tablet Take 3.125 mg by mouth 2 (two) times daily. 03/15/21  Yes [provider]  Cholecalciferol (VITAMIN D-3 PO) Take 1 capsule by mouth daily.   Yes [provider]  colchicine 0.6 MG tablet Take 0.6 mg by mouth daily as needed (gout).   Yes [provider]  ferrous sulfate   325 (65 FE) MG tablet Take 1 tablet (325 mg total) by mouth daily. 12/17/21 03/29/23  Regalado, Belkys A, MD  levothyroxine  (SYNTHROID , LEVOTHROID) 50 MCG tablet Take 50 mcg by mouth daily before breakfast.    [provider]  LORazepam  (ATIVAN ) 1 MG tablet Take 1 mg by mouth 3 (three) times daily as needed (for vertigo).    [provider]  ondansetron  (ZOFRAN ) 8 MG tablet Take 8 mg by mouth every 8 (eight) hours as needed for nausea or vomiting.    [provider]  ONETOUCH ULTRA test strip USE SUBCUTANEOUSLY TO CHECK BLOOD SUGAR TWICE DAILY  for 90    [provider]  senna (SENOKOT) 8.6 MG TABS tablet Take 2 tablets by mouth daily.    [provider]  tiZANidine (ZANAFLEX) 4 MG tablet Take 4 mg by mouth every 8 (eight) hours as needed for muscle spasms.    [provider]  traMADol  (ULTRAM ) 50 MG tablet Take 50 mg by mouth 2 (two) times daily as needed for moderate pain. Maximum dose= 8 tablets per day pain    [provider]  TURMERIC CURCUMIN PO Take 600 mg by mouth daily.    [provider]    Social History   Socioeconomic History   Marital status: Married    Spouse name: Not on file   Number of children: 0   Years of education: Not on file   Highest education level: Not on file  Occupational History   Occupation: retired  Tobacco Use   Smoking status: Former   Smokeless tobacco: Never  Advertising account planner   Vaping status: Never Used  Substance and Sexual Activity   Alcohol use: No   Drug use: No   Sexual activity: Never  Other Topics Concern   Not on file  Social History Narrative   Right handed   Lives in 3 story building   Drinks caffeine   Social Drivers of Corporate investment banker Strain: Not on file  Food Insecurity: Not on file  Transportation Needs: Not on file  Physical Activity: Not on file  Stress: Not on file  Social Connections: Not on file  Intimate Partner Violence: Not on file    Family History  Problem Relation Age of Onset   Diabetes Mother    Diabetes Sister    Colon cancer Neg Hx    Colon polyps Neg Hx    Rectal cancer Neg Hx    Stomach cancer Neg Hx    Esophageal cancer Neg Hx    Inflammatory bowel disease Neg Hx    Liver disease Neg Hx    Pancreatic cancer Neg Hx     ROS: Otherwise negative unless mentioned in HPI  Physical Examination  Vitals:   04/06/24 0630 04/06/24 0813  BP: 106/65   Pulse: 92   Resp: (!) 27   Temp:  98 F (36.7 C)  SpO2: 100%    Body mass index is 24.98 kg/m.  General: no acute  distress Cardiac: hemodynamically stable Pulm: normal work of breathing Neuro: alert, no focal deficit Extremities: Color symmetric, no edema or cyanosis.  Motor and sensory intact and symmetric Vascular:   Right: Palpable femoral, biphasic DP  Left: Palpable femoral, biphasic DP   Data:   CTA independently reviewed No acute occlusion identified.  Bilateral SFA and popliteal arteries are widely patent.  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  ASSESSMENT/PLAN: This is a 89 y.o. female who presented to the emergency department due to significant left leg pain.  She was felt to have bilateral pulmonary emboli and was hypotensive.  Her hypotension improved with a fluid bolus which improved the symptoms of her left leg as well as the color.  She has patent vasculature bilaterally and has a biphasic DP on the left.  She is motor and sensory intact at this time.  She does have an unexplained left thigh and left rectus muscle hematoma but denies any fall or trauma.  At this time her perfusion is reasonable and does not warrant any intervention. Will obtain an ABI to better quantify her perfusion although she is currently asymptomatic.   Norman GORMAN Serve MD [facility] [phone] 04/06/2024  9:25 AM

## 2024-04-06 NOTE — ED Notes (Addendum)
 Critical lab lactic 6.1. Provider made aware

## 2024-04-06 NOTE — Consult Note (Signed)
 NAME:  Julie [name], MRN:  [id number], DOB:  August 24, 1935, LOS: 0 ADMISSION DATE:  04/06/2024, CONSULTATION DATE:  04/06/24 REFERRING MD:  EDP, CHIEF COMPLAINT:  L hip pain   History of Present Illness:  89 year old woman with advanced arthritis, chronic neuropathy, near-bedbound status who is presenting with sudden onset L hip and leg pain.  Found in ER to have potential ischemic LLE so sent for aortic protocol CTA C/A/P.  This interestingly showed L abdominal wall and L thigh hematoma as well as pulmonary emboli without obvious RV strain.  She adamantly denies any trauma.  Bps low on admit but improved with IVF bolus as did doppler signal in LLE.  She also just vomited and feels better after doing this.  PCCM consulted due to complexity of presentation.  Pertinent  Medical History   Past Medical History:  Diagnosis Date   Anemia    Asthma    Diabetes (HCC)    GERD (gastroesophageal reflux disease)    Gout    Heart palpitations    Hypertension    Hypothyroidism    Peptic ulcer      Significant Hospital Events: Including procedures, antibiotic start and stop dates in addition to other pertinent events   6/30 consult  Interim History / Subjective:  Consult  Objective    Blood pressure 106/65, pulse 92, temperature 98 F (36.7 C), resp. rate (!) 27, height 5' 4 (1.626 m), weight 66 kg, SpO2 100%.        Intake/Output Summary (Last 24 hours) at 04/06/2024 0751 Last data filed at 04/06/2024 0550 Gross per 24 hour  Intake 1000 ml  Output --  Net 1000 ml   Filed Weights   04/06/24 0356  Weight: 66 kg    Examination: General: Very talkative pleasant elderly woman in NAD HENT: MM dry, trachea midline Lungs: clear, occasional tachypnea she attributes to pain Cardiovascular: regular, ext warm to tough, LLE now dopplerable Abdomen: soft, mild TTP on L thigh and L abdominal wall Extremities: as above, no edema ++ arthritic changes with ulnar deviation likely RA Neuro:  moves but globally weak, diminished sensation LE chronic from DM Skin: no rashes  Labs/imaging reviewed  Resolved problem list   Assessment and Plan  Bilateral PE-  ?traumatic vs spontaneous hematomas of thigh and abdominal wall Severe PVD worsened by above events, no e/o ongoing ischemia  - Trend CBC, lactate - Hold AC for now - Position of clots for mechanical thrombectomy tough to tell on limited views; no obvious impending shock state - Check LE duplex and echo to help risk-stratify (reviewed echo, poor windows does have some RV dysfunction) - If CBC stable can start AC challenge - If CBC not stable may need to consider IVC filter - Discussed code status, patient is DNR w/ poor functional status, if she gets worse may need to consider GOC before offering advanced interventions like repeat CTA and mechanical thrombectomy - Given normal hemodynamics, if lactate is stable/improved okay for progressive, if non-clearing lactate may need to take to unit to figure out best method of rescuscitation (aka is it blood loss or impending RV failure)  Best Practice (right click and Reselect all SmartList Selections daily)   Diet/type: Regular consistency (see orders) DVT prophylaxis other Pressure ulcer(s): N/A GI prophylaxis: N/A Lines: N/A Foley:  N/A Code Status:  DNR Last date of multidisciplinary goals of care discussion [updated patient]  Labs   CBC: Recent Labs  Lab 04/06/24 0428 04/06/24 0433 04/06/24  9385  WBC 11.2*  --   --   NEUTROABS 6.0  --   --   HGB 10.0* 9.9*  --   HCT 31.4* 29.0*  --   MCV 95.4  --   --   PLT 212  --  157    Basic Metabolic Panel: Recent Labs  Lab 04/06/24 0428 04/06/24 0433  NA 144 140  K 3.6 4.8  CL 121* 111  CO2 15*  --   GLUCOSE 155* 196*  BUN 23 31*  CREATININE 1.25* 1.80*  CALCIUM 6.4*  --    GFR: Estimated Creatinine Clearance: 20.6 mL/min (A) (by C-G formula based on SCr of 1.8 mg/dL (H)). Recent Labs  Lab  04/06/24 0428 04/06/24 0434  WBC 11.2*  --   LATICACIDVEN  --  3.1*    Liver Function Tests: No results for input(s): AST, ALT, ALKPHOS, BILITOT, PROT, ALBUMIN in the last 168 hours. No results for input(s): LIPASE, AMYLASE in the last 168 hours. No results for input(s): AMMONIA in the last 168 hours.  ABG    Component Value Date/Time   TCO2 20 (L) 04/06/2024 0433     Coagulation Profile: Recent Labs  Lab 04/06/24 0428 04/06/24 0614  INR 1.1 1.5*    Cardiac Enzymes: Recent Labs  Lab 04/06/24 0428  CKTOTAL 54    HbA1C: No results found for: HGBA1C  CBG: No results for input(s): GLUCAP in the last 168 hours.  Review of Systems:   Full ROS as per HPI  Past Medical History:  She,  has a past medical history of Anemia, Asthma, Diabetes (HCC), GERD (gastroesophageal reflux disease), Gout, Heart palpitations, Hypertension, Hypothyroidism, and Peptic ulcer.   Surgical History:   Past Surgical History:  Procedure Laterality Date   BIOPSY  12/17/2021   Procedure: BIOPSY;  Surgeon: Teressa Toribio SQUIBB, MD;  Location: [facility];  Service: Gastroenterology;;   BIOPSY  05/03/2022   Procedure: BIOPSY;  Surgeon: Teressa Toribio SQUIBB, MD;  Location: [facility];  Service: Gastroenterology;;   ROMAYNE Bilateral    COLONOSCOPY WITH PROPOFOL  N/A 12/17/2021   Procedure: COLONOSCOPY WITH PROPOFOL ;  Surgeon: Teressa Toribio SQUIBB, MD;  Location: [facility];  Service: Gastroenterology;  Laterality: N/A;   ESOPHAGOGASTRODUODENOSCOPY (EGD) WITH PROPOFOL  N/A 12/17/2021   Procedure: ESOPHAGOGASTRODUODENOSCOPY (EGD) WITH PROPOFOL ;  Surgeon: Teressa Toribio SQUIBB, MD;  Location: [facility];  Service: Gastroenterology;  Laterality: N/A;   ESOPHAGOGASTRODUODENOSCOPY (EGD) WITH PROPOFOL  N/A 05/03/2022   Procedure: ESOPHAGOGASTRODUODENOSCOPY (EGD) WITH PROPOFOL ;  Surgeon: Teressa Toribio SQUIBB, MD;  Location: [facility];  Service: Gastroenterology;  Laterality: N/A;    HEMORRHOID SURGERY     OVARIAN CYST REMOVAL Left    THROAT SURGERY     due to not being able to swallow   THYROIDECTOMY     UPPER GASTROINTESTINAL ENDOSCOPY       Social History:   reports that she has quit smoking. She has never used smokeless tobacco. She reports that she does not drink alcohol and does not use drugs.   Family History:  Her family history includes Diabetes in her mother and sister. There is no history of Colon cancer, Colon polyps, Rectal cancer, Stomach cancer, Esophageal cancer, Inflammatory bowel disease, Liver disease, or Pancreatic cancer.   Allergies Allergies  Allergen Reactions   Ace Inhibitors Swelling and Other (See Comments)    Angioedemia  Lisinopril    Advair Hfa [Fluticasone-Salmeterol] Other (See Comments)    Blurred vision    Amrix [Cyclobenzaprine Hcl] Other (See Comments)  Abnormal LFT(s)   Aspirin Other (See Comments)    Bleeding    Nsaids Other (See Comments)    Bleeding   Pregabalin Other (See Comments)    Patient did not want to experience possible side effects   Zocor [Simvastatin] Other (See Comments)    Muscle pain   Nifedipine Anxiety and Other (See Comments)    Nervousness     Home Medications  Prior to Admission medications   Medication Sig Start Date End Date Taking? Authorizing Provider  amLODipine  (NORVASC ) 10 MG tablet Take 10 mg by mouth daily.    [provider]  carvedilol  (COREG ) 3.125 MG tablet Take 3.125 mg by mouth 2 (two) times daily. 03/15/21   [provider]  Cholecalciferol (VITAMIN D-3) 25 MCG (1000 UT) CAPS Take 1,000 Units by mouth daily.    [provider]  colchicine 0.6 MG tablet Take 0.6 mg by mouth daily as needed (gout).    [provider]  esomeprazole  (NEXIUM ) 40 MG capsule Take 1 capsule (40 mg total) by mouth 2 (two) times daily before a meal. 07/15/23   Mansouraty, Aloha Raddle., MD  ferrous sulfate  325 (65 FE) MG tablet Take 1 tablet (325 mg total) by mouth  daily. 12/17/21 03/29/23  Regalado, Belkys A, MD  levothyroxine  (SYNTHROID , LEVOTHROID) 50 MCG tablet Take 50 mcg by mouth daily before breakfast.    [provider]  LORazepam  (ATIVAN ) 1 MG tablet Take 1 mg by mouth 3 (three) times daily as needed (for vertigo).    [provider]  ondansetron  (ZOFRAN ) 8 MG tablet Take 8 mg by mouth every 8 (eight) hours as needed for nausea or vomiting.    [provider]  ONETOUCH ULTRA test strip USE SUBCUTANEOUSLY TO CHECK BLOOD SUGAR TWICE DAILY for 90    [provider]  senna (SENOKOT) 8.6 MG TABS tablet Take 2 tablets by mouth daily.    [provider]  tiZANidine (ZANAFLEX) 4 MG tablet Take 4 mg by mouth every 8 (eight) hours as needed for muscle spasms.    [provider]  traMADol  (ULTRAM ) 50 MG tablet Take 50 mg by mouth 2 (two) times daily as needed for moderate pain. Maximum dose= 8 tablets per day pain    [provider]  TURMERIC CURCUMIN PO Take 600 mg by mouth daily.    [provider]     Critical care time: N/A
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
## 2024-04-06 NOTE — ED Notes (Signed)
 Pt's left leg & foot appear to have more blood flow at this time. Color is less purple but still not back to baseline for pt. EDP at bedside, pedal pulses auscultated by doppler.

## 2024-04-06 NOTE — ED Provider Notes (Signed)
 [place] EMERGENCY DEPARTMENT AT [facility] Provider Note   CSN: [id number] Arrival date & time: 04/06/24  9652     Patient presents with: Leg Pain   Julie [name] is a 89 y.o. female.   Patient presents to the emergency department for evaluation of left leg pain.  Patient reports that she woke up around 2 AM and was having severe pain in the left leg.  She reports that the leg is numb and she cannot move it.  Denies any injury.       Prior to Admission medications   Medication Sig Start Date End Date Taking? Authorizing Provider  amLODipine  (NORVASC ) 10 MG tablet Take 10 mg by mouth daily.    [provider]  carvedilol  (COREG ) 3.125 MG tablet Take 3.125 mg by mouth 2 (two) times daily. 03/15/21   [provider]  Cholecalciferol (VITAMIN D-3) 25 MCG (1000 UT) CAPS Take 1,000 Units by mouth daily.    [provider]  colchicine 0.6 MG tablet Take 0.6 mg by mouth daily as needed (gout).    [provider]  esomeprazole  (NEXIUM ) 40 MG capsule Take 1 capsule (40 mg total) by mouth 2 (two) times daily before a meal. 07/15/23   Mansouraty, Aloha Raddle., MD  ferrous sulfate  325 (65 FE) MG tablet Take 1 tablet (325 mg total) by mouth daily. 12/17/21 03/29/23  Regalado, Belkys A, MD  levothyroxine  (SYNTHROID , LEVOTHROID) 50 MCG tablet Take 50 mcg by mouth daily before breakfast.    [provider]  LORazepam  (ATIVAN ) 1 MG tablet Take 1 mg by mouth 3 (three) times daily as needed (for vertigo).    [provider]  ondansetron  (ZOFRAN ) 8 MG tablet Take 8 mg by mouth every 8 (eight) hours as needed for nausea or vomiting.    [provider]  ONETOUCH ULTRA test strip USE SUBCUTANEOUSLY TO CHECK BLOOD SUGAR TWICE DAILY for 90    [provider]  senna (SENOKOT) 8.6 MG TABS tablet Take 2 tablets by mouth daily.    [provider]  tiZANidine (ZANAFLEX) 4 MG tablet Take 4 mg by mouth every 8 (eight) hours as  needed for muscle spasms.    [provider]  traMADol  (ULTRAM ) 50 MG tablet Take 50 mg by mouth 2 (two) times daily as needed for moderate pain. Maximum dose= 8 tablets per day pain    [provider]  TURMERIC CURCUMIN PO Take 600 mg by mouth daily.    [provider]    Allergies: Ace inhibitors, Advair hfa [fluticasone-salmeterol], Amrix [cyclobenzaprine hcl], Aspirin, Nsaids, Pregabalin, Zocor [simvastatin], and Nifedipine    Review of Systems  Updated Vital Signs BP 106/65   Pulse 92   Temp 98 F (36.7 C)   Resp (!) 27   Ht 5' 4 (1.626 m)   Wt 66 kg   SpO2 100%   BMI 24.98 kg/m   Physical Exam Vitals and nursing note reviewed.  Constitutional:      General: She is not in acute distress.    Appearance: She is well-developed. She is diaphoretic.  HENT:     Head: Normocephalic and atraumatic.     Mouth/Throat:     Mouth: Mucous membranes are moist.   Eyes:     General: Vision grossly intact. Gaze aligned appropriately.     Extraocular Movements: Extraocular movements intact.     Conjunctiva/sclera: Conjunctivae normal.    Cardiovascular:     Rate and Rhythm: Normal rate  and regular rhythm.     Pulses:          Dorsalis pedis pulses are 1+ on the right side and detected w/ Doppler on the left side.     Heart sounds: Normal heart sounds, S1 normal and S2 normal. No murmur heard.    No friction rub. No gallop.  Pulmonary:     Effort: Pulmonary effort is normal. No respiratory distress.     Breath sounds: Normal breath sounds.  Abdominal:     General: Bowel sounds are normal.     Palpations: Abdomen is soft.     Tenderness: There is no abdominal tenderness. There is no guarding or rebound.     Hernia: No hernia is present.   Musculoskeletal:        General: No swelling.     Cervical back: Full passive range of motion without pain, normal range of motion and neck supple. No spinous process tenderness or muscular tenderness. Normal range  of motion.     Left upper leg: Swelling and tenderness present.     Right lower leg: No edema.     Left lower leg: No edema.   Skin:    General: Skin is warm.     Capillary Refill: Capillary refill takes less than 2 seconds.     Coloration: Skin is cyanotic (left lower leg and foot).     Findings: No ecchymosis, erythema, rash or wound.   Neurological:     General: No focal deficit present.     Mental Status: She is alert and oriented to person, place, and time.     GCS: GCS eye subscore is 4. GCS verbal subscore is 5. GCS motor subscore is 6.     Cranial Nerves: Cranial nerves 2-12 are intact.     Sensory: Sensory deficit (LLE) present.     Motor: Weakness (LLE) present.   Psychiatric:        Attention and Perception: Attention normal.        Mood and Affect: Mood normal.        Speech: Speech normal.        Behavior: Behavior normal.     (all labs ordered are listed, but only abnormal results are displayed) Labs Reviewed  CBC WITH DIFFERENTIAL/PLATELET - Abnormal; Notable for the following components:      Result Value   WBC 11.2 (*)    RBC 3.29 (*)    Hemoglobin 10.0 (*)    HCT 31.4 (*)    Lymphs Abs 4.2 (*)    Abs Immature Granulocytes 0.22 (*)    All other components within normal limits  BASIC METABOLIC PANEL WITH GFR - Abnormal; Notable for the following components:   Chloride 121 (*)    CO2 15 (*)    Glucose, Bld 155 (*)    Creatinine, Ser 1.25 (*)    Calcium 6.4 (*)    GFR, Estimated 42 (*)    All other components within normal limits  APTT - Abnormal; Notable for the following components:   aPTT 23 (*)    All other components within normal limits  I-STAT CG4 LACTIC ACID, ED - Abnormal; Notable for the following components:   Lactic Acid, Venous 3.1 (*)    All other components within normal limits  I-STAT CHEM 8, ED - Abnormal; Notable for the following components:   BUN 31 (*)    Creatinine, Ser 1.80 (*)    Glucose, Bld 196 (*)    Calcium, Ion  1.12  (*)    TCO2 20 (*)    Hemoglobin 9.9 (*)    HCT 29.0 (*)    All other components within normal limits  TROPONIN I (HIGH SENSITIVITY) - Abnormal; Notable for the following components:   Troponin I (High Sensitivity) 21 (*)    All other components within normal limits  CK  PROTIME-INR  DIC (DISSEMINATED INTRAVASCULAR COAGULATION)PANEL  TYPE AND SCREEN  TROPONIN I (HIGH SENSITIVITY)    EKG: EKG Interpretation Date/Time:  Monday April 06 2024 04:47:03 EDT Ventricular Rate:  80 PR Interval:  138 QRS Duration:  72 QT Interval:  370 QTC Calculation: 426 R Axis:   -15  Text Interpretation: Normal sinus rhythm Nonspecific ST and T wave abnormality Abnormal ECG When compared with ECG of 04-Feb-2022 11:41, No significant change since last tracing Confirmed by Haze Lonni PARAS [phone]) on 04/06/2024 4:57:44 AM  Radiology: CT Angio Aortobifemoral W and/or Wo Contrast Addendum Date: 04/06/2024 ADDENDUM REPORT: 04/06/2024 06:17 ADDENDUM: Study discussed by telephone with Dr. LONNI HAZE on 04/06/2024 at 0608 hours. Electronically Signed   By: VEAR Hurst M.D.   On: 04/06/2024 06:17   Result Date: 04/06/2024 CLINICAL DATA:  89 year old female with claudication, limb ischemia. EXAM: CT ANGIOGRAPHY OF ABDOMINAL AORTA WITH ILIOFEMORAL RUNOFF TECHNIQUE: Multidetector CT imaging of the abdomen, pelvis and lower extremities was performed using the standard protocol during bolus administration of intravenous contrast. Multiplanar CT image reconstructions and MIPs were obtained to evaluate the vascular anatomy. RADIATION DOSE REDUCTION: This exam was performed according to the departmental dose-optimization program which includes automated exposure control, adjustment of the mA and/or kV according to patient size and/or use of iterative reconstruction technique. CONTRAST:  OMNIPAQUE IOHEXOL 350 MG/ML SOLN COMPARISON:  Noncontrast CT Abdomen and Pelvis 02/04/2022. FINDINGS: VASCULAR Aorta: Tortuous  visible descending and abdominal aorta. Negative for abdominal aortic aneurysm or dissection. Calcified aortic atherosclerosis. Celiac: Patent, no significant atherosclerosis. SMA: Patent, minimal atherosclerosis. Renals: Patent, minimal atherosclerosis. IMA: Patent, with calcified plaque at its origin. Pelvic arteries: Tortuous bilateral iliac arteries with calcified atherosclerosis. Bilateral iliacs remain patent. No iliac artery aneurysm. Significantly delayed contrast bolus, inadequate femoral and lower extremity arterial contrast timing on the initial imaging phase at 0525 hours. Imaging was repeated from the mid thighs distally at 0527 hours with adequate contrast timing. RIGHT Lower Extremity Femoral: Calcified Common femoral atherosclerosis. Patent right femoral bifurcation without stenosis. Calcified SFA and PFA. Calcification throughout the right SFA is felt to simulate the appearance of vascular stenting. Inadequate contrast opacification of the distal SFA on the initial images. On the 2nd imaging phase the distal SFA and popliteal arteries are patent. Popliteal artery soft and calcified plaque. Runoff: Three-vessel runoff with extensive calcified atherosclerosis affecting all vessels. Anterior tibial and posterior tibial arteries remain patent to the foot. LEFT Lower Extremity Femoral: Patent Common femoral artery with calcified atherosclerosis. Similar extensive left SFA and PFA calcified plaque, simulating the appearance of SFA vascular stent. Insufficient contrast timing of the distal SFA. On the 2nd imaging phase the distal SFA and popliteal arteries are patent. Left knee arthroplasty streak artifact obscures portions of the popliteal artery. But it remains patent. Runoff: Heavily calcified three-vessel runoff. The anterior tibial artery is occluded about 3-4 cm distal to its origin on series 13, image 94. There is also poor enhancement of the right posterior tibial artery, in which density appears  more related to vessel calcification than preserved enhancement. The fibular artery is occluded on series 13, image 146 in  its distal 3rd. Subsequently, there is little to no distal left lower extremity runoff enhancement. Review of the MIP images confirms the above findings. NON-VASCULAR Lower chest: Normal heart size, no pericardial effusion. Left lower lobe pulmonary embolus on series 5, image 6. Nearby posterior basal segment right lower lobe confluent lung opacity and small volume pleural effusion. Contralateral right middle or upper lobe pulmonary artery embolism also faintly visible on series 5, image 1. Visible main pulmonary artery, right main arteries are patent. Visible lung bases otherwise are clear.  No right pleural effusion. Hepatobiliary: Extensive layering gravel type gallstones. No pericholecystic inflammation. Small but circumscribed hypodense areas scattered in the liver compatible with benign etiology such as cyst or hemangioma and stable since 2023 (no follow up imaging recommended). Pancreas: Partially atrophied. Spleen: Diminutive, negative. Adrenals/Urinary Tract: Chronic bilateral renal cysts including a large exophytic right upper pole cyst with simple fluid density (no follow-up imaging recommended). Otherwise symmetric renal parenchymal enhancement. No hydronephrosis or hydroureter is evident. Mass effect on the urinary bladder from space of Retzius, left anterior pelvic hematoma which is further detailed below. Punctate pelvic phleboliths. Stomach/Bowel: Moderate size stool ball in the rectum. Upstream large bowel decompressed. Descending and sigmoid diverticulosis with no active inflammation. Decompressed cecum, normal appendix on series 5, image 97. No dilated small bowel. Small to moderate volume of retained fluid and gas in the stomach. No pneumoperitoneum. No free fluid. Lymphatic: No lymphadenopathy identified. Reproductive: Negative. Other: No pelvis free fluid. Musculoskeletal:  Chronic severe lumbar spine disc and endplate degeneration, widespread chronic vacuum disc and endplate sclerosis. Lower thoracic flowing endplate osteophytes resulting in interbody ankylosis. No acute osseous abnormality identified in the abdomen or pelvis. But there is a rectus muscle hematoma on the left, tracking along a distance of 10 cm as seen on series 8, image 104. The left rectus muscle is expanded up to 40 x 50 x 100 mm. And there is anterior pelvic, left space of Retzius hematoma extension on series 5, image 118. Abnormal left thigh anterior compartment muscle enlargement and heterogeneity favored to be indistinct intramuscular hematoma (series 5, image 174) which tracks along a distance of at least 24 cm as seen on series 7, image 61. Smaller volume posterior muscle compartment intramuscular hematoma suspected such as on series 5, image 215. The underlying left femur appears to remain intact. No soft tissue gas. Left knee arthroplasty with streak artifact. Bilateral foot degenerative osseous changes. Bilateral great toe metallic hardware. No acute osseous abnormality identified. IMPRESSION: 1. Partially visible bilateral Acute Pulmonary Emboli. Visible clot most apparent in the left lower lobe medial basal segmental branch with evidence of a small associated lower lobe infarct, small left pleural effusion. 2. Moderate sized left lower Rectus Muscle Hematoma. Associated hematoma extension into the anterior left pelvic space, left space of Retzius. Mass effect on the urinary bladder. 3. Additional long segment Left Thigh Intramuscular Hematoma, anterior more so than posterior muscle compartment involvement. Superimposed left knee arthroplasty. No acute osseous abnormality identified. 4. Advanced femoral artery and bilateral lower extremity calcified atherosclerosis with Poor Left lower extremity runoff; occluded proximal left anterior tibial artery, occluded distal left fibular artery, and poor  enhancement throughout the left posterior tibial. 5. Maintained contralateral right lower extremity 3 vessel runoff, with extensive calcified atherosclerosis. 6. Tortuous abdominal aorta and iliac arteries with Aortoiliac calcified atherosclerosis. Negative for abdominal aortic aneurysm or dissection. Aortic Atherosclerosis (ICD10-I70.0). Electronically Signed: By: VEAR Hurst M.D. On: 04/06/2024 06:01     .Critical Care  Performed by: Haze Lonni PARAS, MD Authorized by: Haze Lonni PARAS, MD   Critical care provider statement:    Critical care time (minutes):  30   Critical care was necessary to treat or prevent imminent or life-threatening deterioration of the following conditions:  Circulatory failure and metabolic crisis   Critical care was time spent personally by me on the following activities:  Development of treatment plan with patient or surrogate, discussions with consultants, evaluation of patient's response to treatment, examination of patient, ordering and review of laboratory studies, ordering and review of radiographic studies, ordering and performing treatments and interventions, pulse oximetry, re-evaluation of patient's condition and review of old charts   I assumed direction of critical care for this patient from another provider in my specialty: no     Care discussed with: admitting provider      Medications Ordered in the ED  calcium gluconate 1 g/ 50 mL sodium chloride  IVPB (1,000 mg Intravenous New Bag/Given 04/06/24 0630)  lactated ringers  bolus 1,000 mL (0 mLs Intravenous Stopped 04/06/24 0550)  iohexol (OMNIPAQUE) 350 MG/ML injection 100 mL (100 mLs Intravenous Contrast Given 04/06/24 0531)                                    Medical Decision Making Amount and/or Complexity of Data Reviewed External Data Reviewed: labs, radiology, ECG and notes. Labs: ordered. Decision-making details documented in ED Course. Radiology: ordered and independent interpretation  performed. Decision-making details documented in ED Course. ECG/medicine tests: ordered and independent interpretation performed. Decision-making details documented in ED Course.  Risk Prescription drug management.   Differential diagnosis considered includes, but not limited to: Ischemic limb; DVT; aortic dissection  Patient presents to the emergency department stating that she awakened from sleep with severe pain in her left leg.  Examination at arrival reveals tenderness and swelling of the thigh with duskiness of the lower leg, cyanosis of the toes.  Left foot is cool to the touch compared to the right.  Ischemic limb was considered.  After some effort, left dorsalis pedal pulse was found with Doppler device.  Patient sent to radiology for CT angiography to further evaluate.  Patient with multiple unanticipated findings.  Patient has bilateral pulmonary embolism noted incidentally.  No significant respiratory compromise at this time.  Patient also with rectus muscle hematoma and large left thigh hematoma.  No known trauma that would cause this.  Patient not on blood thinners.  Additionally, patient has occluded proximal left anterior tibial artery, occluded distal left fibular artery, and poor enhancement throughout the left posterior tibial.   I suspect patient's initial low blood pressures might be secondary to the blood loss into the thigh.  Type and screen ordered.  Patient's blood pressure improved with IV fluids.  Duskiness of the leg and cyanosis of the toes significantly improved with adequate blood pressure.  This may have been causing some of the ischemia symptoms.  Compartment syndrome still a possibility secondary to increased pain and decreased sensation of the leg.  She is, however, now spontaneously moving the leg without difficulty.  Discussed with Dr. Pearline, on-call for vascular surgery, will consult on the patient.  Discussed with Dr. Layman, on-call for critical care,  critical care will evaluate the patient for possible admission.     Final diagnoses:  Bilateral pulmonary embolism (HCC)  Nontraumatic rectus hematoma  Thigh hematoma, left, initial encounter  Limb ischemia  ED Discharge Orders     None          Haze Lonni PARAS, MD 04/06/24 [phone]

## 2024-04-06 NOTE — ED Notes (Signed)
 Pt asked if I could move her leg, when I uncovered her leg her left foot was purple in color. EDP at bedside assessing pt. Pt states she couldn't feel touch sensation from mid lower leg to her foot. EDP found pedal pulse in right foot, ED nurse found faint pulse in left foot both sites marked with X.

## 2024-04-06 NOTE — ED Notes (Signed)
 Patient cannot go to floor until repeat Lactic is resulted.

## 2024-04-06 NOTE — ED Notes (Signed)
 CCMD called and notified

## 2024-04-06 NOTE — ED Triage Notes (Signed)
 Pt BIB [facility] from home for left leg pain that runs down her leg. EMS administered 25mcg of fentanyl & 4mg  of zofran  via 20g in LAC. Pt states it didn't help.  No other complaints at time of triage.

## 2024-04-06 NOTE — ED Notes (Signed)
 CCMD contacted to admit the patient for cardiac monitoring.

## 2024-04-06 NOTE — ED Notes (Signed)
 Date and time results received: 04/06/24 0531 (use smartphrase .now to insert current time)  Test: calcium Critical Value: 6.4  Name of Provider Notified: L. Beverley, PA  Orders Received? Or Actions Taken?: n/a

## 2024-04-07 ENCOUNTER — Inpatient Hospital Stay (HOSPITAL_COMMUNITY)

## 2024-04-07 DIAGNOSIS — S8012XA Contusion of left lower leg, initial encounter: Secondary | ICD-10-CM | POA: Diagnosis not present

## 2024-04-07 DIAGNOSIS — R609 Edema, unspecified: Secondary | ICD-10-CM | POA: Diagnosis not present

## 2024-04-07 LAB — CBC
HCT: 21.3 % — ABNORMAL LOW (ref 36.0–46.0)
HCT: 29.2 % — ABNORMAL LOW (ref 36.0–46.0)
Hemoglobin: 6.8 g/dL — CL (ref 12.0–15.0)
Hemoglobin: 9.6 g/dL — ABNORMAL LOW (ref 12.0–15.0)
MCH: 30.8 pg (ref 26.0–34.0)
MCH: 30.4 pg (ref 26.0–34.0)
MCHC: 31.9 g/dL (ref 30.0–36.0)
MCHC: 32.9 g/dL (ref 30.0–36.0)
MCV: 96.4 fL (ref 80.0–100.0)
MCV: 92.4 fL (ref 80.0–100.0)
Platelets: 138 10*3/uL — ABNORMAL LOW (ref 150–400)
Platelets: 122 10*3/uL — ABNORMAL LOW (ref 150–400)
RBC: 2.21 MIL/uL — ABNORMAL LOW (ref 3.87–5.11)
RBC: 3.16 MIL/uL — ABNORMAL LOW (ref 3.87–5.11)
RDW: 14.6 % (ref 11.5–15.5)
RDW: 15.1 % (ref 11.5–15.5)
WBC: 12.8 10*3/uL — ABNORMAL HIGH (ref 4.0–10.5)
WBC: 11.9 10*3/uL — ABNORMAL HIGH (ref 4.0–10.5)
nRBC: 0.3 % — ABNORMAL HIGH (ref 0.0–0.2)
nRBC: 0.3 % — ABNORMAL HIGH (ref 0.0–0.2)

## 2024-04-07 LAB — GLUCOSE, CAPILLARY
Glucose-Capillary: 97 mg/dL (ref 70–99)
Glucose-Capillary: 99 mg/dL (ref 70–99)
Glucose-Capillary: 88 mg/dL (ref 70–99)
Glucose-Capillary: 91 mg/dL (ref 70–99)
Glucose-Capillary: 97 mg/dL (ref 70–99)

## 2024-04-07 LAB — BASIC METABOLIC PANEL WITH GFR
Anion gap: 6 (ref 5–15)
Anion gap: 12 (ref 5–15)
BUN: 37 mg/dL — ABNORMAL HIGH (ref 8–23)
BUN: 32 mg/dL — ABNORMAL HIGH (ref 8–23)
CO2: 21 mmol/L — ABNORMAL LOW (ref 22–32)
CO2: 17 mmol/L — ABNORMAL LOW (ref 22–32)
Calcium: 7.9 mg/dL — ABNORMAL LOW (ref 8.9–10.3)
Calcium: 8.1 mg/dL — ABNORMAL LOW (ref 8.9–10.3)
Chloride: 112 mmol/L — ABNORMAL HIGH (ref 98–111)
Chloride: 110 mmol/L (ref 98–111)
Creatinine, Ser: 2.17 mg/dL — ABNORMAL HIGH (ref 0.44–1.00)
Creatinine, Ser: 2 mg/dL — ABNORMAL HIGH (ref 0.44–1.00)
GFR, Estimated: 22 mL/min — ABNORMAL LOW (ref >60)
GFR, Estimated: 24 mL/min — ABNORMAL LOW (ref >60)
Glucose, Bld: 102 mg/dL — ABNORMAL HIGH (ref 70–99)
Glucose, Bld: 84 mg/dL (ref 70–99)
Potassium: 6 mmol/L — ABNORMAL HIGH (ref 3.5–5.1)
Potassium: 4.8 mmol/L (ref 3.5–5.1)
Sodium: 139 mmol/L (ref 135–145)
Sodium: 139 mmol/L (ref 135–145)

## 2024-04-07 LAB — PREPARE RBC (CROSSMATCH)

## 2024-04-07 MED ORDER — PANTOPRAZOLE SODIUM 40 MG PO TBEC
40.0000 mg | DELAYED_RELEASE_TABLET | Freq: Every day | ORAL | Status: DC
  Administered 2024-04-07 – 2024-04-17 (×11): 40 mg via ORAL
  Filled 2024-04-07 (×11): qty 1

## 2024-04-07 MED ORDER — MIDAZOLAM HCL 2 MG/2ML IJ SOLN
INTRAMUSCULAR | Status: AC | PRN
  Administered 2024-04-07: 1 mg via INTRAVENOUS

## 2024-04-07 MED ORDER — CALCIUM GLUCONATE-NACL 1-0.675 GM/50ML-% IV SOLN
1.0000 g | Freq: Once | INTRAVENOUS | Status: AC
  Administered 2024-04-07: 1000 mg via INTRAVENOUS
  Filled 2024-04-07: qty 50

## 2024-04-07 MED ORDER — DEXTROSE 50 % IV SOLN
1.0000 | Freq: Once | INTRAVENOUS | Status: AC
  Administered 2024-04-07: 50 mL via INTRAVENOUS
  Filled 2024-04-07: qty 50

## 2024-04-07 MED ORDER — MIDAZOLAM HCL 2 MG/2ML IJ SOLN
INTRAMUSCULAR | Status: AC
  Filled 2024-04-07: qty 2

## 2024-04-07 MED ORDER — INSULIN ASPART 100 UNIT/ML IV SOLN
5.0000 [IU] | Freq: Once | INTRAVENOUS | Status: AC
  Administered 2024-04-07: 5 [IU] via INTRAVENOUS

## 2024-04-07 MED ORDER — CHLORHEXIDINE GLUCONATE CLOTH 2 % EX PADS
6.0000 | MEDICATED_PAD | Freq: Every day | CUTANEOUS | Status: DC
  Administered 2024-04-07 – 2024-04-17 (×11): 6 via TOPICAL

## 2024-04-07 MED ORDER — FENTANYL CITRATE (PF) 100 MCG/2ML IJ SOLN
INTRAMUSCULAR | Status: AC | PRN
  Administered 2024-04-07: 25 ug via INTRAVENOUS

## 2024-04-07 MED ORDER — IOHEXOL 300 MG/ML  SOLN
100.0000 mL | Freq: Once | INTRAMUSCULAR | Status: AC | PRN
  Administered 2024-04-07: 20 mL via INTRAVENOUS

## 2024-04-07 MED ORDER — IOHEXOL 300 MG/ML  SOLN
50.0000 mL | Freq: Once | INTRAMUSCULAR | Status: AC | PRN
  Administered 2024-04-07: 10 mL via INTRAVENOUS

## 2024-04-07 MED ORDER — LIDOCAINE-EPINEPHRINE 1 %-1:100000 IJ SOLN
INTRAMUSCULAR | Status: AC
  Filled 2024-04-07: qty 1

## 2024-04-07 MED ORDER — FENTANYL CITRATE (PF) 100 MCG/2ML IJ SOLN
INTRAMUSCULAR | Status: AC
  Filled 2024-04-07: qty 2

## 2024-04-07 MED ORDER — SODIUM ZIRCONIUM CYCLOSILICATE 10 G PO PACK
10.0000 g | PACK | Freq: Once | ORAL | Status: AC
  Administered 2024-04-07: 10 g via ORAL
  Filled 2024-04-07: qty 1

## 2024-04-07 MED ORDER — SODIUM CHLORIDE 0.9% IV SOLUTION: Freq: Once | INTRAVENOUS | Status: AC

## 2024-04-07 NOTE — Progress Notes (Signed)
 Venous duplex lower ext  has been completed. Refer to [facility] under chart review to view preliminary results.   04/07/2024  9:57 AM Nashae Maudlin, Ricka BIRCH

## 2024-04-07 NOTE — Progress Notes (Signed)
 PROGRESS NOTE        PATIENT DETAILS Name: Julie [name] Age: 89 y.o. Sex: female Date of Birth: 11/20/1934 Admit Date: 04/06/2024 Admitting Physician Maximino DELENA Sharps, MD ERE:Rnoopwd, Lonell, DO  Brief Summary: Patient is a 89 y.o.  female with history of HTN, hypothyroidism, GERD, advanced arthritis, chronic neuropathy-near bedbound status-who presented with sudden onset of left leg pain that woke her up around 2 am on the day of her presentation-she was evaluated in the ED-due to concern for potential ischemic LLE-she underwent CTA study-which showed rectus sheath/left thigh hematoma as well as pulmonary emboli.  She was briefly hypotensive in the ED but responded to IVF bolus. She was evaluated by PCCM-not felt to require intervention for PE-also evaluated by vascular surgery-thought to have patent vasculature bilaterally.  She was subsequently admitted to the hospitalist service.  Significant events: 6/30>> presented with sudden onset of left leg pain-found to have left rectus muscle/left thigh hematoma and PE.  Significant studies: 6/30>> CTA abdominal aorta with iliofemoral runoff: Partially visible pulmonary emboli, moderate size left rectus muscle hematoma, additional long segment left thigh intramuscular hematoma, advanced femoral/bilateral lower extremity calcified atherosclerosis. 6/30>> TTE: EF> 75% 7/01>> B/L LE Doppler: Acute DVT left femoral/left popliteal/left tibial/left peroneal vein  Significant microbiology data: None  Procedures: 7/01>> IVC filter placement  Consults: Vascular surgery PCCM IR  Subjective: Lying comfortably in bed-denies any chest pain or shortness of breath.  Objective: Vitals: Blood pressure 106/61, pulse 91, temperature 99 F (37.2 C), temperature source Oral, resp. rate 19, height 5' 4 (1.626 m), weight 66 kg, SpO2 100%.   Exam: Gen Exam:Alert awake-not in any distress HEENT:atraumatic, normocephalic Chest:  B/L clear to auscultation anteriorly CVS:S1S2 regular Abdomen:soft non tender, non distended Extremities:no edema Neurology: Non focal Skin: no rash  Pertinent Labs/Radiology:    Latest Ref Rng & Units 04/07/2024    4:30 PM 04/07/2024    5:51 AM 04/06/2024    8:14 PM  CBC  WBC 4.0 - 10.5 K/uL 11.9  12.8  14.5   Hemoglobin 12.0 - 15.0 g/dL 9.6  6.8  7.6   Hematocrit 36.0 - 46.0 % 29.2  21.3  23.6   Platelets 150 - 400 K/uL 122  138  168     Lab Results  Component Value Date   NA 139 04/07/2024   K 4.8 04/07/2024   CL 110 04/07/2024   CO2 17 (L) 04/07/2024      Assessment/Plan: Left rectus muscle hematoma/Left thigh hematoma with acute blood loss anemia Unclear etiology-denies obvious trauma-not on anticoagulation S/p 2 units of PRBC on 7/1-Hb stable Supportive care-follow CBC and transfuse as needed Apply abdominal binder  Acute bilateral PE with left lower extremity DVT Not a candidate for anticoagulation S/p IVC filter placement by IR on 7/01 Probably provoked VTE in the setting of near bedbound status.  AKI on CKD stage IIIb AKI likely hemodynamically mediated Supportive care Follow electrolytes Avoid nephrotoxic agents.  Nausea/vomiting No apparent cause noted on CT imaging Supportive care with antiemetics  PAD Per vascular surgery-has patent vasculature bilaterally No intervention recommended Unable to use aspirin Follow-up with vascular surgery in the outpatient setting ABI ordered but given recent diagnosis of acute DVT-not done and on hold.  HTN BP stable-all antihypertensives on hold-was hypotensive on initial presentation.  History of peripheral neuropathy/severe arthritis Chronic debility/deconditioning Supportive care PT/OT  eval when able  Code status:   Code Status: Limited: Do not attempt resuscitation (DNR) -DNR-LIMITED -Do Not Intubate/DNI    DVT Prophylaxis:***      Family Communication: None at bedside   Disposition Plan: Status  is: Inpatient {Inpatient:23812}   Planned Discharge Destination:{Plan; Disposition:26390}   Diet: Diet Order             Diet renal with fluid restriction Fluid restriction: 1200 mL Fluid; Room service appropriate? Yes; Fluid consistency: Thin  Diet effective now                     Antimicrobial agents: Anti-infectives (From admission, onward)    None        MEDICATIONS: Scheduled Meds:  Chlorhexidine Gluconate Cloth  6 each Topical Daily   pantoprazole   40 mg Oral Daily   sodium chloride  flush  3 mL Intravenous Q12H   Continuous Infusions: PRN Meds:.acetaminophen  **OR** acetaminophen , albuterol, colchicine, dextrose, LORazepam , ondansetron  (ZOFRAN ) IV, prochlorperazine, traMADol    I have personally reviewed following labs and imaging studies  LABORATORY DATA: CBC: Recent Labs  Lab 04/06/24 0428 04/06/24 0433 04/06/24 0614 04/06/24 1200 04/06/24 2014 04/07/24 0551 04/07/24 1630  WBC 11.2*  --   --  16.5* 14.5* 12.8* 11.9*  NEUTROABS 6.0  --   --   --   --   --   --   HGB 10.0* 9.9*  --  8.6* 7.6* 6.8* 9.6*  HCT 31.4* 29.0*  --  27.0* 23.6* 21.3* 29.2*  MCV 95.4  --   --  97.1 94.8 96.4 92.4  PLT 212  --  157 180 168 138* 122*    Basic Metabolic Panel: Recent Labs  Lab 04/06/24 0428 04/06/24 0433 04/07/24 0551 04/07/24 1630  NA 144 140 139 139  K 3.6 4.8 6.0* 4.8  CL 121* 111 112* 110  CO2 15*  --  21* 17*  GLUCOSE 155* 196* 102* 84  BUN 23 31* 37* 32*  CREATININE 1.25* 1.80* 2.17* 2.00*  CALCIUM 6.4*  --  7.9* 8.1*    GFR: Estimated Creatinine Clearance: 18.5 mL/min (A) (by C-G formula based on SCr of 2 mg/dL (H)).  Liver Function Tests: No results for input(s): AST, ALT, ALKPHOS, BILITOT, PROT, ALBUMIN in the last 168 hours. Recent Labs  Lab 04/06/24 0910  LIPASE 41   No results for input(s): AMMONIA in the last 168 hours.  Coagulation Profile: Recent Labs  Lab 04/06/24 0428 04/06/24 0614  INR 1.1 1.5*     Cardiac Enzymes: Recent Labs  Lab 04/06/24 0428  CKTOTAL 54    BNP (last 3 results) No results for input(s): PROBNP in the last 8760 hours.  Lipid Profile: No results for input(s): CHOL, HDL, LDLCALC, TRIG, CHOLHDL, LDLDIRECT in the last 72 hours.  Thyroid  Function Tests: No results for input(s): TSH, T4TOTAL, FREET4, T3FREE, THYROIDAB in the last 72 hours.  Anemia Panel: No results for input(s): VITAMINB12, FOLATE, FERRITIN, TIBC, IRON, RETICCTPCT in the last 72 hours.  Urine analysis:    Component Value Date/Time   COLORURINE STRAW (A) 02/04/2022 1226   APPEARANCEUR CLEAR 02/04/2022 1226   LABSPEC 1.006 02/04/2022 1226   PHURINE 5.0 02/04/2022 1226   GLUCOSEU NEGATIVE 02/04/2022 1226   HGBUR NEGATIVE 02/04/2022 1226   BILIRUBINUR NEGATIVE 02/04/2022 1226   KETONESUR NEGATIVE 02/04/2022 1226   PROTEINUR NEGATIVE 02/04/2022 1226   UROBILINOGEN 0.2 09/11/2011 0927   NITRITE NEGATIVE 02/04/2022 1226   LEUKOCYTESUR NEGATIVE 02/04/2022 1226  Sepsis Labs: Lactic Acid, Venous    Component Value Date/Time   LATICACIDVEN 3.3 (HH) 04/06/2024 2014    MICROBIOLOGY: No results found for this or any previous visit (from the past 240 hours).  RADIOLOGY STUDIES/RESULTS: IR IVC FILTER PLMT / S&I PORTER GUID/MOD SED Result Date: 04/07/2024 Jenna Cordella LABOR, MD     04/07/2024  3:53 PM Interventional Radiology Procedure Note Procedure: IVC filter placement Complications: None Estimated Blood Loss: < 10 mL Findings: Denali filter placed in the infrarenal ivc. Cordella LABOR Jenna, MD   VAS US  LOWER EXTREMITY VENOUS (DVT) Result Date: 04/07/2024  Lower Venous DVT Study Patient Name:  Julie [name]  Date of Exam:   04/07/2024 Medical Rec #: [id number]      Accession #:    [id number] Date of Birth: 12/07/1934      Patient Gender: F Patient Age:   65 years Exam Location:  [facility] Procedure:      VAS US  LOWER EXTREMITY VENOUS (DVT) Referring  Phys: TORIBIO SHARPS --------------------------------------------------------------------------------  Indications: Onset of severe pain in left leg and numbness and trouble moving her leg.  Comparison Study: No priors Performing Technologist: Delon Fetch  Examination Guidelines: A complete evaluation includes B-mode imaging, spectral Doppler, color Doppler, and power Doppler as needed of all accessible portions of each vessel. Bilateral testing is considered an integral part of a complete examination. Limited examinations for reoccurring indications may be performed as noted. The reflux portion of the exam is performed with the patient in reverse Trendelenburg.  +---------+---------------+---------+-----------+----------+--------------+ RIGHT    CompressibilityPhasicitySpontaneityPropertiesThrombus Aging +---------+---------------+---------+-----------+----------+--------------+ CFV      Full           Yes      Yes                                 +---------+---------------+---------+-----------+----------+--------------+ SFJ      Full                                                        +---------+---------------+---------+-----------+----------+--------------+ FV Prox  Full                                                        +---------+---------------+---------+-----------+----------+--------------+ FV Mid   Full                                                        +---------+---------------+---------+-----------+----------+--------------+ FV DistalFull                                                        +---------+---------------+---------+-----------+----------+--------------+ PFV      Full                                                        +---------+---------------+---------+-----------+----------+--------------+  POP      Full           Yes      Yes                                  +---------+---------------+---------+-----------+----------+--------------+ PTV      Full                                                        +---------+---------------+---------+-----------+----------+--------------+ PERO     Full                                                        +---------+---------------+---------+-----------+----------+--------------+   +---------+---------------+---------+-----------+----------+--------------+ LEFT     CompressibilityPhasicitySpontaneityPropertiesThrombus Aging +---------+---------------+---------+-----------+----------+--------------+ CFV      Full           Yes      Yes                                 +---------+---------------+---------+-----------+----------+--------------+ SFJ      Full                                                        +---------+---------------+---------+-----------+----------+--------------+ FV Prox  Full                                                        +---------+---------------+---------+-----------+----------+--------------+ FV Mid   Full                                                        +---------+---------------+---------+-----------+----------+--------------+ FV DistalPartial        Yes      Yes                  Acute          +---------+---------------+---------+-----------+----------+--------------+ PFV      Full                                                        +---------+---------------+---------+-----------+----------+--------------+ POP      None           No       No                   Acute          +---------+---------------+---------+-----------+----------+--------------+ PTV  Partial                                      Acute          +---------+---------------+---------+-----------+----------+--------------+ PERO     None                                         Acute           +---------+---------------+---------+-----------+----------+--------------+     Summary: RIGHT: - There is no evidence of deep vein thrombosis in the lower extremity.  - No cystic structure found in the popliteal fossa.  LEFT: - Findings consistent with acute deep vein thrombosis involving the left femoral vein, left popliteal vein, left posterior tibial veins, and left peroneal veins.   *See table(s) above for measurements and observations.    Preliminary    ECHOCARDIOGRAM COMPLETE Result Date: 04/06/2024    ECHOCARDIOGRAM REPORT   Patient Name:   Julie [name] Date of Exam: 04/06/2024 Medical Rec #:  [id number]     Height:       64.0 in Accession #:    [id number]    Weight:       145.5 lb Date of Birth:  1935-10-07     BSA:          1.709 m Patient Age:    89 years      BP:           111/68 mmHg Patient Gender: F             HR:           88 bpm. Exam Location:  Inpatient Procedure: 2D Echo, Color Doppler and Cardiac Doppler (Both Spectral and Color            Flow Doppler were utilized during procedure). STAT ECHO Indications:    I26.02 Pulmonary embolus  History:        Patient has no prior history of Echocardiogram examinations.                 Risk Factors:Diabetes and Hypertension.  Sonographer:    Damien Senior RDCS Referring Phys: [id number] TORIBIO JAYSON SHARPS  Sonographer Comments: Technically difficult due to poor echo windows. IMPRESSIONS  1. Left ventricular ejection fraction, by estimation, is >75%. The left ventricle has hyperdynamic function. The left ventricle has no regional wall motion abnormalities. Left ventricular diastolic parameters were normal.  2. The right ventricle is not well visualized. Consider repeat study with contrast. On most views, RV function appears mildly reduced consistent with normal IVC caliber and S'; however, on image 54, RV appears dilated with at least moderately reduced systolic function.  3. The mitral valve is grossly normal. No evidence of mitral valve regurgitation. No  evidence of mitral stenosis.  4. The aortic valve is normal in structure. Aortic valve regurgitation is trivial. No aortic stenosis is present.  5. The inferior vena cava is normal in size with greater than 50% respiratory variability, suggesting right atrial pressure of 3 mmHg. FINDINGS  Left Ventricle: Left ventricular ejection fraction, by estimation, is >75%. The left ventricle has hyperdynamic function. The left ventricle has no regional wall motion abnormalities. The left ventricular internal cavity size was normal in size. There is no left ventricular hypertrophy. Left ventricular diastolic parameters were normal.  Right Ventricle: The right ventricular size is not well visualized. Right vetricular wall thickness was not well visualized. Right ventricular systolic function was not well visualized. Left Atrium: Left atrial size was normal in size. Right Atrium: Right atrial size was normal in size. Pericardium: There is no evidence of pericardial effusion. Mitral Valve: The mitral valve is grossly normal. No evidence of mitral valve regurgitation. No evidence of mitral valve stenosis. Tricuspid Valve: The tricuspid valve is normal in structure. Tricuspid valve regurgitation is not demonstrated. No evidence of tricuspid stenosis. Aortic Valve: The aortic valve is normal in structure. Aortic valve regurgitation is trivial. No aortic stenosis is present. Pulmonic Valve: The pulmonic valve was normal in structure. Pulmonic valve regurgitation is not visualized. No evidence of pulmonic stenosis. Aorta: The aortic root is normal in size and structure. Venous: The inferior vena cava is normal in size with greater than 50% respiratory variability, suggesting right atrial pressure of 3 mmHg. IAS/Shunts: No atrial level shunt detected by color flow Doppler.   Diastology LV e' medial:    5.98 cm/s LV E/e' medial:  8.1 LV e' lateral:   10.80 cm/s LV E/e' lateral: 4.5  RIGHT VENTRICLE RV S prime:     20.50 cm/s TAPSE  (M-mode): 1.3 cm LEFT ATRIUM             Index        RIGHT ATRIUM          Index LA Vol (A2C):   26.1 ml 15.27 ml/m  RA Area:     9.48 cm LA Vol (A4C):   28.8 ml 16.85 ml/m  RA Volume:   22.00 ml 12.87 ml/m LA Biplane Vol: 28.3 ml 16.56 ml/m  AORTIC VALVE LVOT Vmax:   91.00 cm/s LVOT Vmean:  66.200 cm/s LVOT VTI:    0.165 m MITRAL VALVE MV Area (PHT): 3.30 cm    SHUNTS MV Decel Time: 230 msec    Systemic VTI: 0.16 m MV E velocity: 48.70 cm/s MV A velocity: 94.90 cm/s MV E/A ratio:  0.51 Aditya Sabharwal Electronically signed by Ria Commander Signature Date/Time: 04/06/2024/9:25:07 AM    Final    CT Angio Aortobifemoral W and/or Wo Contrast Addendum Date: 04/06/2024 ADDENDUM REPORT: 04/06/2024 06:17 ADDENDUM: Study discussed by telephone with Dr. LONNI SEATS on 04/06/2024 at 0608 hours. Electronically Signed   By: VEAR Hurst M.D.   On: 04/06/2024 06:17   Result Date: 04/06/2024 CLINICAL DATA:  89 year old female with claudication, limb ischemia. EXAM: CT ANGIOGRAPHY OF ABDOMINAL AORTA WITH ILIOFEMORAL RUNOFF TECHNIQUE: Multidetector CT imaging of the abdomen, pelvis and lower extremities was performed using the standard protocol during bolus administration of intravenous contrast. Multiplanar CT image reconstructions and MIPs were obtained to evaluate the vascular anatomy. RADIATION DOSE REDUCTION: This exam was performed according to the departmental dose-optimization program which includes automated exposure control, adjustment of the mA and/or kV according to patient size and/or use of iterative reconstruction technique. CONTRAST:  OMNIPAQUE IOHEXOL 350 MG/ML SOLN COMPARISON:  Noncontrast CT Abdomen and Pelvis 02/04/2022. FINDINGS: VASCULAR Aorta: Tortuous visible descending and abdominal aorta. Negative for abdominal aortic aneurysm or dissection. Calcified aortic atherosclerosis. Celiac: Patent, no significant atherosclerosis. SMA: Patent, minimal atherosclerosis. Renals: Patent, minimal  atherosclerosis. IMA: Patent, with calcified plaque at its origin. Pelvic arteries: Tortuous bilateral iliac arteries with calcified atherosclerosis. Bilateral iliacs remain patent. No iliac artery aneurysm. Significantly delayed contrast bolus, inadequate femoral and lower extremity arterial contrast timing on the initial imaging phase at 0525 hours.  Imaging was repeated from the mid thighs distally at 0527 hours with adequate contrast timing. RIGHT Lower Extremity Femoral: Calcified Common femoral atherosclerosis. Patent right femoral bifurcation without stenosis. Calcified SFA and PFA. Calcification throughout the right SFA is felt to simulate the appearance of vascular stenting. Inadequate contrast opacification of the distal SFA on the initial images. On the 2nd imaging phase the distal SFA and popliteal arteries are patent. Popliteal artery soft and calcified plaque. Runoff: Three-vessel runoff with extensive calcified atherosclerosis affecting all vessels. Anterior tibial and posterior tibial arteries remain patent to the foot. LEFT Lower Extremity Femoral: Patent Common femoral artery with calcified atherosclerosis. Similar extensive left SFA and PFA calcified plaque, simulating the appearance of SFA vascular stent. Insufficient contrast timing of the distal SFA. On the 2nd imaging phase the distal SFA and popliteal arteries are patent. Left knee arthroplasty streak artifact obscures portions of the popliteal artery. But it remains patent. Runoff: Heavily calcified three-vessel runoff. The anterior tibial artery is occluded about 3-4 cm distal to its origin on series 13, image 94. There is also poor enhancement of the right posterior tibial artery, in which density appears more related to vessel calcification than preserved enhancement. The fibular artery is occluded on series 13, image 146 in its distal 3rd. Subsequently, there is little to no distal left lower extremity runoff enhancement. Review of the  MIP images confirms the above findings. NON-VASCULAR Lower chest: Normal heart size, no pericardial effusion. Left lower lobe pulmonary embolus on series 5, image 6. Nearby posterior basal segment right lower lobe confluent lung opacity and small volume pleural effusion. Contralateral right middle or upper lobe pulmonary artery embolism also faintly visible on series 5, image 1. Visible main pulmonary artery, right main arteries are patent. Visible lung bases otherwise are clear.  No right pleural effusion. Hepatobiliary: Extensive layering gravel type gallstones. No pericholecystic inflammation. Small but circumscribed hypodense areas scattered in the liver compatible with benign etiology such as cyst or hemangioma and stable since 2023 (no follow up imaging recommended). Pancreas: Partially atrophied. Spleen: Diminutive, negative. Adrenals/Urinary Tract: Chronic bilateral renal cysts including a large exophytic right upper pole cyst with simple fluid density (no follow-up imaging recommended). Otherwise symmetric renal parenchymal enhancement. No hydronephrosis or hydroureter is evident. Mass effect on the urinary bladder from space of Retzius, left anterior pelvic hematoma which is further detailed below. Punctate pelvic phleboliths. Stomach/Bowel: Moderate size stool ball in the rectum. Upstream large bowel decompressed. Descending and sigmoid diverticulosis with no active inflammation. Decompressed cecum, normal appendix on series 5, image 97. No dilated small bowel. Small to moderate volume of retained fluid and gas in the stomach. No pneumoperitoneum. No free fluid. Lymphatic: No lymphadenopathy identified. Reproductive: Negative. Other: No pelvis free fluid. Musculoskeletal: Chronic severe lumbar spine disc and endplate degeneration, widespread chronic vacuum disc and endplate sclerosis. Lower thoracic flowing endplate osteophytes resulting in interbody ankylosis. No acute osseous abnormality identified in  the abdomen or pelvis. But there is a rectus muscle hematoma on the left, tracking along a distance of 10 cm as seen on series 8, image 104. The left rectus muscle is expanded up to 40 x 50 x 100 mm. And there is anterior pelvic, left space of Retzius hematoma extension on series 5, image 118. Abnormal left thigh anterior compartment muscle enlargement and heterogeneity favored to be indistinct intramuscular hematoma (series 5, image 174) which tracks along a distance of at least 24 cm as seen on series 7, image 61. Smaller volume posterior muscle  compartment intramuscular hematoma suspected such as on series 5, image 215. The underlying left femur appears to remain intact. No soft tissue gas. Left knee arthroplasty with streak artifact. Bilateral foot degenerative osseous changes. Bilateral great toe metallic hardware. No acute osseous abnormality identified. IMPRESSION: 1. Partially visible bilateral Acute Pulmonary Emboli. Visible clot most apparent in the left lower lobe medial basal segmental branch with evidence of a small associated lower lobe infarct, small left pleural effusion. 2. Moderate sized left lower Rectus Muscle Hematoma. Associated hematoma extension into the anterior left pelvic space, left space of Retzius. Mass effect on the urinary bladder. 3. Additional long segment Left Thigh Intramuscular Hematoma, anterior more so than posterior muscle compartment involvement. Superimposed left knee arthroplasty. No acute osseous abnormality identified. 4. Advanced femoral artery and bilateral lower extremity calcified atherosclerosis with Poor Left lower extremity runoff; occluded proximal left anterior tibial artery, occluded distal left fibular artery, and poor enhancement throughout the left posterior tibial. 5. Maintained contralateral right lower extremity 3 vessel runoff, with extensive calcified atherosclerosis. 6. Tortuous abdominal aorta and iliac arteries with Aortoiliac calcified  atherosclerosis. Negative for abdominal aortic aneurysm or dissection. Aortic Atherosclerosis (ICD10-I70.0). Electronically Signed: By: VEAR Hurst M.D. On: 04/06/2024 06:01     LOS: 1 day   Donalda Applebaum, MD  [facility]    To contact the attending provider between 7A-7P or the covering provider during after hours 7P-7A, please log into the web site [email or web address] and access using universal [place] password for that web site. If you do not have the password, please call the hospital operator.  04/07/2024, 8:18 PM

## 2024-04-07 NOTE — Procedures (Signed)
 Interventional Radiology Procedure Note  Procedure: IVC filter placement  Complications: None  Estimated Blood Loss: < 10 mL  Findings: Denali filter placed in the infrarenal ivc.  Cordella DELENA Banner, MD

## 2024-04-07 NOTE — Progress Notes (Signed)
 PROGRESS NOTE    Julie [name]  FMW:[id number] DOB: 13-Jan-1935 DOA: 04/06/2024 PCP: Gerome Brunet, DO   Chief Complaint  Patient presents with   Leg Pain    Brief Narrative:  Julie [name] is a 89 y.o. female with medical history significant of hypertension, hypothyroidism, gout, GERD presented with left leg pain  and nausea.  Her workup significant for rectus sheath hematoma extending into the pelvic area, as well imaging was significant for bilateral PE, so she was admitted for further workup.  Assessment & Plan:   Principal Problem:   Leg hematoma Active Problems:   Rectus sheath hematoma   PVD (peripheral vascular disease) (HCC)   Bilateral pulmonary embolism (HCC)   Lactic acidosis   Leukocytosis   Nausea and vomiting   Hypocalcemia   Essential hypertension   Chronic kidney disease, stage 3b (HCC)  Left lower rectus muscle hematoma extending into anterior left pelvic space, left space of retzius  Hematoma of the left thigh and left rectus femoris Acute blood loss anemia -No clear etiology, denies any trauma, she is not on any blood thinners. - Discussed with IR, no clear source of being/extravasation, so no procedure can be performed. - Continue with conservative management.  Transfuse as needed - No anticoagulation - SCD for DVT prophylaxis - Apply abdominal binder    Acute bilateral PE acute left lower extremity DVT  -CT on admission significant for bilateral PE with possible pulmonary infarct - Venous Doppler significant for acute DVT in her left leg involving the distal femoral vein, popliteal vein and calf veins.  - Patient with her indication for anticoagulation due to her significant hematoma. - High risk given acute DVT, so we will proceed with IVC filter given her contraindication for anticoagulation.  AKI on chronic kidney disease stage IIIb Metabolic acidosis - Creatinine trending up, most likely in setting of hemodynamics, has had low blood pressure  on admission as well significant acute blood loss anemia, and she received IV contrast yesterday as well evidence of urinary bladder compression from hematoma, so will insert Foley catheter- Hold nephrotoxic medications- -avoid low blood pressures -Continue with volume resuscitation, for now she will be blood transfusion, and IV fluids after that. -Bicarb level is improving  Hyperkalemia - Received Kayexalate, IV calcium gluconate, D50 with IV insulin, monitor closely on telemetry, repeat level after medication administration  PVD -Vascular surgery input greatly appreciated. - Recommendation for ABI, but given recent diagnosis of acute DVT study is on hold.  Lactic acidosis Leukocytosis Acute.  Lactic acid elevated up to 6.1 and WBC 11.2-> 16.5.  - Most likely due to from hypoperfusion in setting of acute blood loss anemia soft blood pressure. - Improving with IV hydration    Nausea and vomiting Patient reported have nausea and vomiting.  No acute cause noted on CT imaging. - Aspiration precautions with elevation head of bed - Antiemetics as needed    Hypocalcemia - replaced    Essential hypertension Initial blood pressures noted to be as low as 89/55  and 75/64. - Hold home blood pressure regimen Coreg  and amlodipine     DVT prophylaxis: Will have IVC filter Code Status: DNR, confirmed by the patient Family Communication: None at bedside I cussed with her niece Erminio by phone Disposition:   Status is: Inpatient    Consultants:  Scholer surgery Interventional radiology   Subjective: Patient denies any complaints this morning  Objective: Vitals:   04/07/24 1025 04/07/24 1026 04/07/24 1027 04/07/24 1140  BP:  ROLLEN)  118/59  128/64  Pulse: 75  79 76  Resp: 14 15 16 15   Temp:  98.8 F (37.1 C)  98.5 F (36.9 C)  TempSrc:  Oral  Oral  SpO2: 97% 95% 95% 98%  Weight:      Height:        Intake/Output Summary (Last 24 hours) at 04/07/2024 1240 Last data filed at  04/07/2024 1207 Gross per 24 hour  Intake 718.5 ml  Output 550 ml  Net 168.5 ml   Filed Weights   04/06/24 0356  Weight: 66 kg    Examination:  Awake Alert, Oriented X 3, No new F.N deficits, Normal affect Symmetrical Chest wall movement, Good air movement bilaterally, CTAB RRR,No Gallops,Rubs or new Murmurs, No Parasternal Heave +ve B.Sounds, Abd Soft, No tenderness, No rebound - guarding or rigidity. No Cyanosis, Clubbing or edema, No new Rash or bruise      Data Reviewed: I have personally reviewed following labs and imaging studies  CBC: Recent Labs  Lab 04/06/24 0428 04/06/24 0433 04/06/24 0614 04/06/24 1200 04/06/24 2014 04/07/24 0551  WBC 11.2*  --   --  16.5* 14.5* 12.8*  NEUTROABS 6.0  --   --   --   --   --   HGB 10.0* 9.9*  --  8.6* 7.6* 6.8*  HCT 31.4* 29.0*  --  27.0* 23.6* 21.3*  MCV 95.4  --   --  97.1 94.8 96.4  PLT 212  --  157 180 168 138*    Basic Metabolic Panel: Recent Labs  Lab 04/06/24 0428 04/06/24 0433 04/07/24 0551  NA 144 140 139  K 3.6 4.8 6.0*  CL 121* 111 112*  CO2 15*  --  21*  GLUCOSE 155* 196* 102*  BUN 23 31* 37*  CREATININE 1.25* 1.80* 2.17*  CALCIUM 6.4*  --  7.9*    GFR: Estimated Creatinine Clearance: 17.1 mL/min (A) (by C-G formula based on SCr of 2.17 mg/dL (H)).  Liver Function Tests: No results for input(s): AST, ALT, ALKPHOS, BILITOT, PROT, ALBUMIN in the last 168 hours.  CBG: Recent Labs  Lab 04/06/24 1050 04/06/24 2228 04/07/24 0732 04/07/24 1142 04/07/24 1233  GLUCAP 95 103* 97 99 88     No results found for this or any previous visit (from the past 240 hours).       Radiology Studies: VAS US  LOWER EXTREMITY VENOUS (DVT) Result Date: 04/07/2024  Lower Venous DVT Study Patient Name:  Julie [name]  Date of Exam:   04/07/2024 Medical Rec #: [id number]      Accession #:    [id number] Date of Birth: [birth year]      Patient Gender: F Patient Age:   21 years Exam Location:  [facility] Procedure:      VAS US  LOWER EXTREMITY VENOUS (DVT) Referring Phys: TORIBIO SHARPS --------------------------------------------------------------------------------  Indications: Onset of severe pain in left leg and numbness and trouble moving her leg.  Comparison Study: No priors Performing Technologist: Delon Fetch  Examination Guidelines: A complete evaluation includes B-mode imaging, spectral Doppler, color Doppler, and power Doppler as needed of all accessible portions of each vessel. Bilateral testing is considered an integral part of a complete examination. Limited examinations for reoccurring indications may be performed as noted. The reflux portion of the exam is performed with the patient in reverse Trendelenburg.  +---------+---------------+---------+-----------+----------+--------------+ RIGHT    CompressibilityPhasicitySpontaneityPropertiesThrombus Aging +---------+---------------+---------+-----------+----------+--------------+ CFV      Full  Yes      Yes                                 +---------+---------------+---------+-----------+----------+--------------+ SFJ      Full                                                        +---------+---------------+---------+-----------+----------+--------------+ FV Prox  Full                                                        +---------+---------------+---------+-----------+----------+--------------+ FV Mid   Full                                                        +---------+---------------+---------+-----------+----------+--------------+ FV DistalFull                                                        +---------+---------------+---------+-----------+----------+--------------+ PFV      Full                                                        +---------+---------------+---------+-----------+----------+--------------+ POP      Full           Yes      Yes                                  +---------+---------------+---------+-----------+----------+--------------+ PTV      Full                                                        +---------+---------------+---------+-----------+----------+--------------+ PERO     Full                                                        +---------+---------------+---------+-----------+----------+--------------+   +---------+---------------+---------+-----------+----------+--------------+ LEFT     CompressibilityPhasicitySpontaneityPropertiesThrombus Aging +---------+---------------+---------+-----------+----------+--------------+ CFV      Full           Yes      Yes                                 +---------+---------------+---------+-----------+----------+--------------+ SFJ  Full                                                        +---------+---------------+---------+-----------+----------+--------------+ FV Prox  Full                                                        +---------+---------------+---------+-----------+----------+--------------+ FV Mid   Full                                                        +---------+---------------+---------+-----------+----------+--------------+ FV DistalPartial        Yes      Yes                  Acute          +---------+---------------+---------+-----------+----------+--------------+ PFV      Full                                                        +---------+---------------+---------+-----------+----------+--------------+ POP      None           No       No                   Acute          +---------+---------------+---------+-----------+----------+--------------+ PTV      Partial                                      Acute          +---------+---------------+---------+-----------+----------+--------------+ PERO     None                                         Acute           +---------+---------------+---------+-----------+----------+--------------+     Summary: RIGHT: - There is no evidence of deep vein thrombosis in the lower extremity.  - No cystic structure found in the popliteal fossa.  LEFT: - Findings consistent with acute deep vein thrombosis involving the left femoral vein, left popliteal vein, left posterior tibial veins, and left peroneal veins.   *See table(s) above for measurements and observations.    Preliminary    ECHOCARDIOGRAM COMPLETE Result Date: 04/06/2024    ECHOCARDIOGRAM REPORT   Patient Name:   Julie [name] Date of Exam: 04/06/2024 Medical Rec #:  [id number]     Height:       64.0 in Accession #:    [id number]    Weight:       145.5 lb Date of Birth:  [birth year]     BSA:  1.709 m Patient Age:    25 years      BP:           111/68 mmHg Patient Gender: F             HR:           88 bpm. Exam Location:  Inpatient Procedure: 2D Echo, Color Doppler and Cardiac Doppler (Both Spectral and Color            Flow Doppler were utilized during procedure). STAT ECHO Indications:    I26.02 Pulmonary embolus  History:        Patient has no prior history of Echocardiogram examinations.                 Risk Factors:Diabetes and Hypertension.  Sonographer:    Damien Senior RDCS Referring Phys: [id number] TORIBIO JAYSON SHARPS  Sonographer Comments: Technically difficult due to poor echo windows. IMPRESSIONS  1. Left ventricular ejection fraction, by estimation, is >75%. The left ventricle has hyperdynamic function. The left ventricle has no regional wall motion abnormalities. Left ventricular diastolic parameters were normal.  2. The right ventricle is not well visualized. Consider repeat study with contrast. On most views, RV function appears mildly reduced consistent with normal IVC caliber and S'; however, on image 54, RV appears dilated with at least moderately reduced systolic function.  3. The mitral valve is grossly normal. No evidence of mitral valve regurgitation. No  evidence of mitral stenosis.  4. The aortic valve is normal in structure. Aortic valve regurgitation is trivial. No aortic stenosis is present.  5. The inferior vena cava is normal in size with greater than 50% respiratory variability, suggesting right atrial pressure of 3 mmHg. FINDINGS  Left Ventricle: Left ventricular ejection fraction, by estimation, is >75%. The left ventricle has hyperdynamic function. The left ventricle has no regional wall motion abnormalities. The left ventricular internal cavity size was normal in size. There is no left ventricular hypertrophy. Left ventricular diastolic parameters were normal. Right Ventricle: The right ventricular size is not well visualized. Right vetricular wall thickness was not well visualized. Right ventricular systolic function was not well visualized. Left Atrium: Left atrial size was normal in size. Right Atrium: Right atrial size was normal in size. Pericardium: There is no evidence of pericardial effusion. Mitral Valve: The mitral valve is grossly normal. No evidence of mitral valve regurgitation. No evidence of mitral valve stenosis. Tricuspid Valve: The tricuspid valve is normal in structure. Tricuspid valve regurgitation is not demonstrated. No evidence of tricuspid stenosis. Aortic Valve: The aortic valve is normal in structure. Aortic valve regurgitation is trivial. No aortic stenosis is present. Pulmonic Valve: The pulmonic valve was normal in structure. Pulmonic valve regurgitation is not visualized. No evidence of pulmonic stenosis. Aorta: The aortic root is normal in size and structure. Venous: The inferior vena cava is normal in size with greater than 50% respiratory variability, suggesting right atrial pressure of 3 mmHg. IAS/Shunts: No atrial level shunt detected by color flow Doppler.   Diastology LV e' medial:    5.98 cm/s LV E/e' medial:  8.1 LV e' lateral:   10.80 cm/s LV E/e' lateral: 4.5  RIGHT VENTRICLE RV S prime:     20.50 cm/s TAPSE  (M-mode): 1.3 cm LEFT ATRIUM             Index        RIGHT ATRIUM          Index LA Vol (A2C):  26.1 ml 15.27 ml/m  RA Area:     9.48 cm LA Vol (A4C):   28.8 ml 16.85 ml/m  RA Volume:   22.00 ml 12.87 ml/m LA Biplane Vol: 28.3 ml 16.56 ml/m  AORTIC VALVE LVOT Vmax:   91.00 cm/s LVOT Vmean:  66.200 cm/s LVOT VTI:    0.165 m MITRAL VALVE MV Area (PHT): 3.30 cm    SHUNTS MV Decel Time: 230 msec    Systemic VTI: 0.16 m MV E velocity: 48.70 cm/s MV A velocity: 94.90 cm/s MV E/A ratio:  0.51 Aditya Sabharwal Electronically signed by Ria Commander Signature Date/Time: 04/06/2024/9:25:07 AM    Final    CT Angio Aortobifemoral W and/or Wo Contrast Addendum Date: 04/06/2024 ADDENDUM REPORT: 04/06/2024 06:17 ADDENDUM: Study discussed by telephone with Dr. LONNI SEATS on 04/06/2024 at 0608 hours. Electronically Signed   By: VEAR Hurst M.D.   On: 04/06/2024 06:17   Result Date: 04/06/2024 CLINICAL DATA:  89 year old female with claudication, limb ischemia. EXAM: CT ANGIOGRAPHY OF ABDOMINAL AORTA WITH ILIOFEMORAL RUNOFF TECHNIQUE: Multidetector CT imaging of the abdomen, pelvis and lower extremities was performed using the standard protocol during bolus administration of intravenous contrast. Multiplanar CT image reconstructions and MIPs were obtained to evaluate the vascular anatomy. RADIATION DOSE REDUCTION: This exam was performed according to the departmental dose-optimization program which includes automated exposure control, adjustment of the mA and/or kV according to patient size and/or use of iterative reconstruction technique. CONTRAST:  OMNIPAQUE IOHEXOL 350 MG/ML SOLN COMPARISON:  Noncontrast CT Abdomen and Pelvis 02/04/2022. FINDINGS: VASCULAR Aorta: Tortuous visible descending and abdominal aorta. Negative for abdominal aortic aneurysm or dissection. Calcified aortic atherosclerosis. Celiac: Patent, no significant atherosclerosis. SMA: Patent, minimal atherosclerosis. Renals: Patent, minimal  atherosclerosis. IMA: Patent, with calcified plaque at its origin. Pelvic arteries: Tortuous bilateral iliac arteries with calcified atherosclerosis. Bilateral iliacs remain patent. No iliac artery aneurysm. Significantly delayed contrast bolus, inadequate femoral and lower extremity arterial contrast timing on the initial imaging phase at 0525 hours. Imaging was repeated from the mid thighs distally at 0527 hours with adequate contrast timing. RIGHT Lower Extremity Femoral: Calcified Common femoral atherosclerosis. Patent right femoral bifurcation without stenosis. Calcified SFA and PFA. Calcification throughout the right SFA is felt to simulate the appearance of vascular stenting. Inadequate contrast opacification of the distal SFA on the initial images. On the 2nd imaging phase the distal SFA and popliteal arteries are patent. Popliteal artery soft and calcified plaque. Runoff: Three-vessel runoff with extensive calcified atherosclerosis affecting all vessels. Anterior tibial and posterior tibial arteries remain patent to the foot. LEFT Lower Extremity Femoral: Patent Common femoral artery with calcified atherosclerosis. Similar extensive left SFA and PFA calcified plaque, simulating the appearance of SFA vascular stent. Insufficient contrast timing of the distal SFA. On the 2nd imaging phase the distal SFA and popliteal arteries are patent. Left knee arthroplasty streak artifact obscures portions of the popliteal artery. But it remains patent. Runoff: Heavily calcified three-vessel runoff. The anterior tibial artery is occluded about 3-4 cm distal to its origin on series 13, image 94. There is also poor enhancement of the right posterior tibial artery, in which density appears more related to vessel calcification than preserved enhancement. The fibular artery is occluded on series 13, image 146 in its distal 3rd. Subsequently, there is little to no distal left lower extremity runoff enhancement. Review of the  MIP images confirms the above findings. NON-VASCULAR Lower chest: Normal heart size, no pericardial effusion. Left lower lobe pulmonary embolus on  series 5, image 6. Nearby posterior basal segment right lower lobe confluent lung opacity and small volume pleural effusion. Contralateral right middle or upper lobe pulmonary artery embolism also faintly visible on series 5, image 1. Visible main pulmonary artery, right main arteries are patent. Visible lung bases otherwise are clear.  No right pleural effusion. Hepatobiliary: Extensive layering gravel type gallstones. No pericholecystic inflammation. Small but circumscribed hypodense areas scattered in the liver compatible with benign etiology such as cyst or hemangioma and stable since 2023 (no follow up imaging recommended). Pancreas: Partially atrophied. Spleen: Diminutive, negative. Adrenals/Urinary Tract: Chronic bilateral renal cysts including a large exophytic right upper pole cyst with simple fluid density (no follow-up imaging recommended). Otherwise symmetric renal parenchymal enhancement. No hydronephrosis or hydroureter is evident. Mass effect on the urinary bladder from space of Retzius, left anterior pelvic hematoma which is further detailed below. Punctate pelvic phleboliths. Stomach/Bowel: Moderate size stool ball in the rectum. Upstream large bowel decompressed. Descending and sigmoid diverticulosis with no active inflammation. Decompressed cecum, normal appendix on series 5, image 97. No dilated small bowel. Small to moderate volume of retained fluid and gas in the stomach. No pneumoperitoneum. No free fluid. Lymphatic: No lymphadenopathy identified. Reproductive: Negative. Other: No pelvis free fluid. Musculoskeletal: Chronic severe lumbar spine disc and endplate degeneration, widespread chronic vacuum disc and endplate sclerosis. Lower thoracic flowing endplate osteophytes resulting in interbody ankylosis. No acute osseous abnormality identified in  the abdomen or pelvis. But there is a rectus muscle hematoma on the left, tracking along a distance of 10 cm as seen on series 8, image 104. The left rectus muscle is expanded up to 40 x 50 x 100 mm. And there is anterior pelvic, left space of Retzius hematoma extension on series 5, image 118. Abnormal left thigh anterior compartment muscle enlargement and heterogeneity favored to be indistinct intramuscular hematoma (series 5, image 174) which tracks along a distance of at least 24 cm as seen on series 7, image 61. Smaller volume posterior muscle compartment intramuscular hematoma suspected such as on series 5, image 215. The underlying left femur appears to remain intact. No soft tissue gas. Left knee arthroplasty with streak artifact. Bilateral foot degenerative osseous changes. Bilateral great toe metallic hardware. No acute osseous abnormality identified. IMPRESSION: 1. Partially visible bilateral Acute Pulmonary Emboli. Visible clot most apparent in the left lower lobe medial basal segmental branch with evidence of a small associated lower lobe infarct, small left pleural effusion. 2. Moderate sized left lower Rectus Muscle Hematoma. Associated hematoma extension into the anterior left pelvic space, left space of Retzius. Mass effect on the urinary bladder. 3. Additional long segment Left Thigh Intramuscular Hematoma, anterior more so than posterior muscle compartment involvement. Superimposed left knee arthroplasty. No acute osseous abnormality identified. 4. Advanced femoral artery and bilateral lower extremity calcified atherosclerosis with Poor Left lower extremity runoff; occluded proximal left anterior tibial artery, occluded distal left fibular artery, and poor enhancement throughout the left posterior tibial. 5. Maintained contralateral right lower extremity 3 vessel runoff, with extensive calcified atherosclerosis. 6. Tortuous abdominal aorta and iliac arteries with Aortoiliac calcified  atherosclerosis. Negative for abdominal aortic aneurysm or dissection. Aortic Atherosclerosis (ICD10-I70.0). Electronically Signed: By: VEAR Hurst M.D. On: 04/06/2024 06:01        Scheduled Meds:  Chlorhexidine Gluconate Cloth  6 each Topical Daily   pantoprazole   40 mg Oral Daily   sodium chloride  flush  3 mL Intravenous Q12H   Continuous Infusions:  calcium gluconate  LOS: 1 day       Brayton Lye, MD [facility]   To contact the attending provider between 7A-7P or the covering provider during after hours 7P-7A, please log into the web site [email or web address] and access using universal  password for that web site. If you do not have the password, please call the hospital operator.  04/07/2024, 12:40 PM

## 2024-04-07 NOTE — Consult Note (Signed)
 Chief Complaint: acute PE; acute DVT; rectus sheath hematoma; anticoagulation contraindication - image guided inferior vena cava filter placement    Referring Provider(s): Elgergawy, Dawood  Supervising Physician: Jenna Hacker  Patient Status: [facility] - In-pt  History of Present Illness: Julie [name] is a 89 y.o. female with history of anemia, asthma, diabetes, gout, hypertension, hypothyroidism, and peptic ulcer.  Pt presented to the emergency department 04/06/24 with left leg pain.  Upon emergency department workup, CT angiogram was obtained revealing acute bilateral pulmonary embolus, rectus sheath hematoma, and contraindication to anticoagulation.  Pt was admitted for further treatment and workup.  Interventional radiology was consulted for possible image guided inferior vena cava filter placement.  Imaging was reviewed and approved by Dr. Jenna 04/07/24 image guided inferior vena cava filter placement.    Patient currently has DNR order in place. Discussion with the patient and family regarding wishes.  The DNR order is modified during the procedure such that limited attempts at resuscitation are clearly defined with regards to specific procedures.    Past Medical History:  Diagnosis Date   Anemia    Asthma    Diabetes (HCC)    GERD (gastroesophageal reflux disease)    Gout    Heart palpitations    Hypertension    Hypothyroidism    Peptic ulcer     Past Surgical History:  Procedure Laterality Date   BIOPSY  12/17/2021   Procedure: BIOPSY;  Surgeon: Teressa Toribio SQUIBB, MD;  Location: [facility];  Service: Gastroenterology;;   BIOPSY  05/03/2022   Procedure: BIOPSY;  Surgeon: Teressa Toribio SQUIBB, MD;  Location: [facility];  Service: Gastroenterology;;   ROMAYNE Bilateral    COLONOSCOPY WITH PROPOFOL  N/A 12/17/2021   Procedure: COLONOSCOPY WITH PROPOFOL ;  Surgeon: Teressa Toribio SQUIBB, MD;  Location: [facility];  Service: Gastroenterology;  Laterality: N/A;    ESOPHAGOGASTRODUODENOSCOPY (EGD) WITH PROPOFOL  N/A 12/17/2021   Procedure: ESOPHAGOGASTRODUODENOSCOPY (EGD) WITH PROPOFOL ;  Surgeon: Teressa Toribio SQUIBB, MD;  Location: [facility];  Service: Gastroenterology;  Laterality: N/A;   ESOPHAGOGASTRODUODENOSCOPY (EGD) WITH PROPOFOL  N/A 05/03/2022   Procedure: ESOPHAGOGASTRODUODENOSCOPY (EGD) WITH PROPOFOL ;  Surgeon: Teressa Toribio SQUIBB, MD;  Location: [facility];  Service: Gastroenterology;  Laterality: N/A;   HEMORRHOID SURGERY     OVARIAN CYST REMOVAL Left    THROAT SURGERY     due to not being able to swallow   THYROIDECTOMY     UPPER GASTROINTESTINAL ENDOSCOPY      Allergies: Ace inhibitors, Advair hfa [fluticasone-salmeterol], Amrix [cyclobenzaprine hcl], Asa [aspirin], Lyrica [pregabalin], Nsaids, Zocor [simvastatin], and Adalat [nifedipine]  Medications: Prior to Admission medications   Medication Sig Start Date End Date Taking? Authorizing Provider  amLODipine  (NORVASC ) 10 MG tablet Take 10 mg by mouth daily.   Yes [provider]  carvedilol  (COREG ) 3.125 MG tablet Take 3.125 mg by mouth 2 (two) times daily. 03/15/21  Yes [provider]  Cholecalciferol (VITAMIN D-3 PO) Take 1 capsule by mouth daily.   Yes [provider]  colchicine 0.6 MG tablet Take 0.6 mg by mouth daily as needed (gout).   Yes [provider]  Cyanocobalamin  (VITAMIN B-12 PO) Take 1 tablet by mouth every other day.   Yes [provider]  ferrous sulfate  325 (65 FE) MG tablet Take 1 tablet (325 mg total) by mouth daily. Patient taking differently: Take 325 mg by mouth See admin instructions. Take 1 tablet (325mg ) by mouth 2-3 times a week. 12/17/21 05/16/24 Yes Regalado, Owen LABOR, MD  LORazepam  (ATIVAN ) 1 MG tablet Take 1 mg by mouth 3 (three) times daily as needed (vertigo).   Yes [provider]  magnesium  sulfate (EPSOM SALT) GRAN Take by mouth See admin instructions. Mix 1 small scoop with water and drink every other  day for bowel regimen.   Yes [provider]  ondansetron  (ZOFRAN ) 8 MG tablet Take 8 mg by mouth 2 (two) times daily as needed for nausea or vomiting.   Yes [provider]  predniSONE (DELTASONE) 5 MG tablet Take 5 mg by mouth daily as needed (pain not relieved by tramadol ).   Yes [provider]  traMADol  (ULTRAM ) 50 MG tablet Take 50 mg by mouth every 6 (six) hours as needed for moderate pain (pain score 4-6).   Yes [provider]     Family History  Problem Relation Age of Onset   Diabetes Mother    Diabetes Sister    Colon cancer Neg Hx    Colon polyps Neg Hx    Rectal cancer Neg Hx    Stomach cancer Neg Hx    Esophageal cancer Neg Hx    Inflammatory bowel disease Neg Hx    Liver disease Neg Hx    Pancreatic cancer Neg Hx     Social History   Socioeconomic History   Marital status: Married    Spouse name: Not on file   Number of children: 0   Years of education: Not on file   Highest education level: Not on file  Occupational History   Occupation: retired  Tobacco Use   Smoking status: Former   Smokeless tobacco: Never  Advertising account planner   Vaping status: Never Used  Substance and Sexual Activity   Alcohol use: No   Drug use: No   Sexual activity: Never  Other Topics Concern   Not on file  Social History Narrative   Right handed   Lives in 3 story building   Drinks caffeine   Social Drivers of Corporate investment banker Strain: Not on file  Food Insecurity: Not on file  Transportation Needs: Not on file  Physical Activity: Not on file  Stress: Not on file  Social Connections: Not on file     Review of Systems: A 12 point ROS discussed and pertinent positives are indicated in the HPI above.  All other systems are negative.  Review of Systems  Constitutional:  Negative for chills, fatigue and fever.  Respiratory:  Negative for cough, shortness of breath and wheezing.   Gastrointestinal:  Negative for diarrhea, nausea  and vomiting.  Neurological:  Negative for dizziness and headaches.  Psychiatric/Behavioral:  Negative for agitation, behavioral problems and confusion.     Vital Signs: BP 128/64 (BP Location: Left Arm)   Pulse 76   Temp 98.5 F (36.9 C) (Oral)   Resp 15   Ht 5' 4 (1.626 m)   Wt 145 lb 8.1 oz (66 kg)   SpO2 98%   BMI 24.98 kg/m   Advance Care Plan: The advanced care place/surrogate decision maker was discussed at the time of visit and the patient did not wish to discuss or was not able to name a surrogate decision maker or provide an advance care plan.  Physical Exam Constitutional:      Appearance: She is well-developed.  HENT:     Head: Atraumatic.     Mouth/Throat:     Mouth: Mucous membranes are moist.   Cardiovascular:     Rate and Rhythm:  Normal rate and regular rhythm.     Heart sounds: No murmur heard. Pulmonary:     Effort: Pulmonary effort is normal.     Breath sounds: Normal breath sounds.  Abdominal:     General: Bowel sounds are normal.     Palpations: Abdomen is soft.   Musculoskeletal:        General: Normal range of motion.   Skin:    General: Skin is warm.   Neurological:     Mental Status: She is alert and oriented to person, place, and time.   Psychiatric:        Mood and Affect: Mood normal.        Behavior: Behavior normal.     Imaging: VAS US  LOWER EXTREMITY VENOUS (DVT) Result Date: 04/07/2024  Lower Venous DVT Study Patient Name:  Julie [name]  Date of Exam:   04/07/2024 Medical Rec #: [id number]      Accession #:    [id number] Date of Birth: [birth year]      Patient Gender: F Patient Age:   53 years Exam Location:  [facility] Procedure:      VAS US  LOWER EXTREMITY VENOUS (DVT) Referring Phys: TORIBIO SHARPS --------------------------------------------------------------------------------  Indications: Onset of severe pain in left leg and numbness and trouble moving her leg.  Comparison Study: No priors Performing Technologist: Delon Fetch  Examination Guidelines: A complete evaluation includes B-mode imaging, spectral Doppler, color Doppler, and power Doppler as needed of all accessible portions of each vessel. Bilateral testing is considered an integral part of a complete examination. Limited examinations for reoccurring indications may be performed as noted. The reflux portion of the exam is performed with the patient in reverse Trendelenburg.  +---------+---------------+---------+-----------+----------+--------------+ RIGHT    CompressibilityPhasicitySpontaneityPropertiesThrombus Aging +---------+---------------+---------+-----------+----------+--------------+ CFV      Full           Yes      Yes                                 +---------+---------------+---------+-----------+----------+--------------+ SFJ      Full                                                        +---------+---------------+---------+-----------+----------+--------------+ FV Prox  Full                                                        +---------+---------------+---------+-----------+----------+--------------+ FV Mid   Full                                                        +---------+---------------+---------+-----------+----------+--------------+ FV DistalFull                                                        +---------+---------------+---------+-----------+----------+--------------+  PFV      Full                                                        +---------+---------------+---------+-----------+----------+--------------+ POP      Full           Yes      Yes                                 +---------+---------------+---------+-----------+----------+--------------+ PTV      Full                                                        +---------+---------------+---------+-----------+----------+--------------+ PERO     Full                                                         +---------+---------------+---------+-----------+----------+--------------+   +---------+---------------+---------+-----------+----------+--------------+ LEFT     CompressibilityPhasicitySpontaneityPropertiesThrombus Aging +---------+---------------+---------+-----------+----------+--------------+ CFV      Full           Yes      Yes                                 +---------+---------------+---------+-----------+----------+--------------+ SFJ      Full                                                        +---------+---------------+---------+-----------+----------+--------------+ FV Prox  Full                                                        +---------+---------------+---------+-----------+----------+--------------+ FV Mid   Full                                                        +---------+---------------+---------+-----------+----------+--------------+ FV DistalPartial        Yes      Yes                  Acute          +---------+---------------+---------+-----------+----------+--------------+ PFV      Full                                                        +---------+---------------+---------+-----------+----------+--------------+  POP      None           No       No                   Acute          +---------+---------------+---------+-----------+----------+--------------+ PTV      Partial                                      Acute          +---------+---------------+---------+-----------+----------+--------------+ PERO     None                                         Acute          +---------+---------------+---------+-----------+----------+--------------+     Summary: RIGHT: - There is no evidence of deep vein thrombosis in the lower extremity.  - No cystic structure found in the popliteal fossa.  LEFT: - Findings consistent with acute deep vein thrombosis involving the left femoral vein, left popliteal vein, left posterior  tibial veins, and left peroneal veins.   *See table(s) above for measurements and observations.    Preliminary    ECHOCARDIOGRAM COMPLETE Result Date: 04/06/2024    ECHOCARDIOGRAM REPORT   Patient Name:   LAYALI FREUND Date of Exam: 04/06/2024 Medical Rec #:  [id number]     Height:       64.0 in Accession #:    [id number]    Weight:       145.5 lb Date of Birth:  04-Sep-1935     BSA:          1.709 m Patient Age:    89 years      BP:           111/68 mmHg Patient Gender: F             HR:           88 bpm. Exam Location:  Inpatient Procedure: 2D Echo, Color Doppler and Cardiac Doppler (Both Spectral and Color            Flow Doppler were utilized during procedure). STAT ECHO Indications:    I26.02 Pulmonary embolus  History:        Patient has no prior history of Echocardiogram examinations.                 Risk Factors:Diabetes and Hypertension.  Sonographer:    Damien Senior RDCS Referring Phys: [id number] TORIBIO JAYSON SHARPS  Sonographer Comments: Technically difficult due to poor echo windows. IMPRESSIONS  1. Left ventricular ejection fraction, by estimation, is >75%. The left ventricle has hyperdynamic function. The left ventricle has no regional wall motion abnormalities. Left ventricular diastolic parameters were normal.  2. The right ventricle is not well visualized. Consider repeat study with contrast. On most views, RV function appears mildly reduced consistent with normal IVC caliber and S'; however, on image 54, RV appears dilated with at least moderately reduced systolic function.  3. The mitral valve is grossly normal. No evidence of mitral valve regurgitation. No evidence of mitral stenosis.  4. The aortic valve is normal in structure. Aortic valve regurgitation is trivial. No aortic stenosis is present.  5. The inferior vena cava is normal in size with greater than  50% respiratory variability, suggesting right atrial pressure of 3 mmHg. FINDINGS  Left Ventricle: Left ventricular ejection fraction, by  estimation, is >75%. The left ventricle has hyperdynamic function. The left ventricle has no regional wall motion abnormalities. The left ventricular internal cavity size was normal in size. There is no left ventricular hypertrophy. Left ventricular diastolic parameters were normal. Right Ventricle: The right ventricular size is not well visualized. Right vetricular wall thickness was not well visualized. Right ventricular systolic function was not well visualized. Left Atrium: Left atrial size was normal in size. Right Atrium: Right atrial size was normal in size. Pericardium: There is no evidence of pericardial effusion. Mitral Valve: The mitral valve is grossly normal. No evidence of mitral valve regurgitation. No evidence of mitral valve stenosis. Tricuspid Valve: The tricuspid valve is normal in structure. Tricuspid valve regurgitation is not demonstrated. No evidence of tricuspid stenosis. Aortic Valve: The aortic valve is normal in structure. Aortic valve regurgitation is trivial. No aortic stenosis is present. Pulmonic Valve: The pulmonic valve was normal in structure. Pulmonic valve regurgitation is not visualized. No evidence of pulmonic stenosis. Aorta: The aortic root is normal in size and structure. Venous: The inferior vena cava is normal in size with greater than 50% respiratory variability, suggesting right atrial pressure of 3 mmHg. IAS/Shunts: No atrial level shunt detected by color flow Doppler.   Diastology LV e' medial:    5.98 cm/s LV E/e' medial:  8.1 LV e' lateral:   10.80 cm/s LV E/e' lateral: 4.5  RIGHT VENTRICLE RV S prime:     20.50 cm/s TAPSE (M-mode): 1.3 cm LEFT ATRIUM             Index        RIGHT ATRIUM          Index LA Vol (A2C):   26.1 ml 15.27 ml/m  RA Area:     9.48 cm LA Vol (A4C):   28.8 ml 16.85 ml/m  RA Volume:   22.00 ml 12.87 ml/m LA Biplane Vol: 28.3 ml 16.56 ml/m  AORTIC VALVE LVOT Vmax:   91.00 cm/s LVOT Vmean:  66.200 cm/s LVOT VTI:    0.165 m MITRAL VALVE MV  Area (PHT): 3.30 cm    SHUNTS MV Decel Time: 230 msec    Systemic VTI: 0.16 m MV E velocity: 48.70 cm/s MV A velocity: 94.90 cm/s MV E/A ratio:  0.51 Aditya Sabharwal Electronically signed by Ria Commander Signature Date/Time: 04/06/2024/9:25:07 AM    Final    CT Angio Aortobifemoral W and/or Wo Contrast Addendum Date: 04/06/2024 ADDENDUM REPORT: 04/06/2024 06:17 ADDENDUM: Study discussed by telephone with Dr. LONNI SEATS on 04/06/2024 at 0608 hours. Electronically Signed   By: VEAR Hurst M.D.   On: 04/06/2024 06:17   Result Date: 04/06/2024 CLINICAL DATA:  89 year old female with claudication, limb ischemia. EXAM: CT ANGIOGRAPHY OF ABDOMINAL AORTA WITH ILIOFEMORAL RUNOFF TECHNIQUE: Multidetector CT imaging of the abdomen, pelvis and lower extremities was performed using the standard protocol during bolus administration of intravenous contrast. Multiplanar CT image reconstructions and MIPs were obtained to evaluate the vascular anatomy. RADIATION DOSE REDUCTION: This exam was performed according to the departmental dose-optimization program which includes automated exposure control, adjustment of the mA and/or kV according to patient size and/or use of iterative reconstruction technique. CONTRAST:  OMNIPAQUE IOHEXOL 350 MG/ML SOLN COMPARISON:  Noncontrast CT Abdomen and Pelvis 02/04/2022. FINDINGS: VASCULAR Aorta: Tortuous visible descending and abdominal aorta. Negative for abdominal aortic aneurysm or dissection. Calcified aortic  atherosclerosis. Celiac: Patent, no significant atherosclerosis. SMA: Patent, minimal atherosclerosis. Renals: Patent, minimal atherosclerosis. IMA: Patent, with calcified plaque at its origin. Pelvic arteries: Tortuous bilateral iliac arteries with calcified atherosclerosis. Bilateral iliacs remain patent. No iliac artery aneurysm. Significantly delayed contrast bolus, inadequate femoral and lower extremity arterial contrast timing on the initial imaging phase at 0525  hours. Imaging was repeated from the mid thighs distally at 0527 hours with adequate contrast timing. RIGHT Lower Extremity Femoral: Calcified Common femoral atherosclerosis. Patent right femoral bifurcation without stenosis. Calcified SFA and PFA. Calcification throughout the right SFA is felt to simulate the appearance of vascular stenting. Inadequate contrast opacification of the distal SFA on the initial images. On the 2nd imaging phase the distal SFA and popliteal arteries are patent. Popliteal artery soft and calcified plaque. Runoff: Three-vessel runoff with extensive calcified atherosclerosis affecting all vessels. Anterior tibial and posterior tibial arteries remain patent to the foot. LEFT Lower Extremity Femoral: Patent Common femoral artery with calcified atherosclerosis. Similar extensive left SFA and PFA calcified plaque, simulating the appearance of SFA vascular stent. Insufficient contrast timing of the distal SFA. On the 2nd imaging phase the distal SFA and popliteal arteries are patent. Left knee arthroplasty streak artifact obscures portions of the popliteal artery. But it remains patent. Runoff: Heavily calcified three-vessel runoff. The anterior tibial artery is occluded about 3-4 cm distal to its origin on series 13, image 94. There is also poor enhancement of the right posterior tibial artery, in which density appears more related to vessel calcification than preserved enhancement. The fibular artery is occluded on series 13, image 146 in its distal 3rd. Subsequently, there is little to no distal left lower extremity runoff enhancement. Review of the MIP images confirms the above findings. NON-VASCULAR Lower chest: Normal heart size, no pericardial effusion. Left lower lobe pulmonary embolus on series 5, image 6. Nearby posterior basal segment right lower lobe confluent lung opacity and small volume pleural effusion. Contralateral right middle or upper lobe pulmonary artery embolism also  faintly visible on series 5, image 1. Visible main pulmonary artery, right main arteries are patent. Visible lung bases otherwise are clear.  No right pleural effusion. Hepatobiliary: Extensive layering gravel type gallstones. No pericholecystic inflammation. Small but circumscribed hypodense areas scattered in the liver compatible with benign etiology such as cyst or hemangioma and stable since 2023 (no follow up imaging recommended). Pancreas: Partially atrophied. Spleen: Diminutive, negative. Adrenals/Urinary Tract: Chronic bilateral renal cysts including a large exophytic right upper pole cyst with simple fluid density (no follow-up imaging recommended). Otherwise symmetric renal parenchymal enhancement. No hydronephrosis or hydroureter is evident. Mass effect on the urinary bladder from space of Retzius, left anterior pelvic hematoma which is further detailed below. Punctate pelvic phleboliths. Stomach/Bowel: Moderate size stool ball in the rectum. Upstream large bowel decompressed. Descending and sigmoid diverticulosis with no active inflammation. Decompressed cecum, normal appendix on series 5, image 97. No dilated small bowel. Small to moderate volume of retained fluid and gas in the stomach. No pneumoperitoneum. No free fluid. Lymphatic: No lymphadenopathy identified. Reproductive: Negative. Other: No pelvis free fluid. Musculoskeletal: Chronic severe lumbar spine disc and endplate degeneration, widespread chronic vacuum disc and endplate sclerosis. Lower thoracic flowing endplate osteophytes resulting in interbody ankylosis. No acute osseous abnormality identified in the abdomen or pelvis. But there is a rectus muscle hematoma on the left, tracking along a distance of 10 cm as seen on series 8, image 104. The left rectus muscle is expanded up to 40 x 50  x 100 mm. And there is anterior pelvic, left space of Retzius hematoma extension on series 5, image 118. Abnormal left thigh anterior compartment muscle  enlargement and heterogeneity favored to be indistinct intramuscular hematoma (series 5, image 174) which tracks along a distance of at least 24 cm as seen on series 7, image 61. Smaller volume posterior muscle compartment intramuscular hematoma suspected such as on series 5, image 215. The underlying left femur appears to remain intact. No soft tissue gas. Left knee arthroplasty with streak artifact. Bilateral foot degenerative osseous changes. Bilateral great toe metallic hardware. No acute osseous abnormality identified. IMPRESSION: 1. Partially visible bilateral Acute Pulmonary Emboli. Visible clot most apparent in the left lower lobe medial basal segmental branch with evidence of a small associated lower lobe infarct, small left pleural effusion. 2. Moderate sized left lower Rectus Muscle Hematoma. Associated hematoma extension into the anterior left pelvic space, left space of Retzius. Mass effect on the urinary bladder. 3. Additional long segment Left Thigh Intramuscular Hematoma, anterior more so than posterior muscle compartment involvement. Superimposed left knee arthroplasty. No acute osseous abnormality identified. 4. Advanced femoral artery and bilateral lower extremity calcified atherosclerosis with Poor Left lower extremity runoff; occluded proximal left anterior tibial artery, occluded distal left fibular artery, and poor enhancement throughout the left posterior tibial. 5. Maintained contralateral right lower extremity 3 vessel runoff, with extensive calcified atherosclerosis. 6. Tortuous abdominal aorta and iliac arteries with Aortoiliac calcified atherosclerosis. Negative for abdominal aortic aneurysm or dissection. Aortic Atherosclerosis (ICD10-I70.0). Electronically Signed: By: VEAR Hurst M.D. On: 04/06/2024 06:01    Labs:  CBC: Recent Labs    04/06/24 0428 04/06/24 0433 04/06/24 0614 04/06/24 1200 04/06/24 2014 04/07/24 0551  WBC 11.2*  --   --  16.5* 14.5* 12.8*  HGB 10.0* 9.9*  --   8.6* 7.6* 6.8*  HCT 31.4* 29.0*  --  27.0* 23.6* 21.3*  PLT 212  --  157 180 168 138*    COAGS: Recent Labs    04/06/24 0428 04/06/24 0614  INR 1.1 1.5*  APTT 23* 34    BMP: Recent Labs    04/06/24 0428 04/06/24 0433 04/07/24 0551  NA 144 140 139  K 3.6 4.8 6.0*  CL 121* 111 112*  CO2 15*  --  21*  GLUCOSE 155* 196* 102*  BUN 23 31* 37*  CALCIUM 6.4*  --  7.9*  CREATININE 1.25* 1.80* 2.17*  GFRNONAA 42*  --  22*    LIVER FUNCTION TESTS: No results for input(s): BILITOT, AST, ALT, ALKPHOS, PROT, ALBUMIN in the last 8760 hours.  TUMOR MARKERS: No results for input(s): AFPTM, CEA, CA199, CHROMGRNA in the last 8760 hours.  Assessment and Plan:  Pt is with acute PE; acute DVT; rectus sheath hematoma; anticoagulation contraindication scheduled for image guided inferior vena cava filter placement 04/07/24.  Imaging was reviewed and approved by Dr. Jenna.    Risks and benefits discussed with the patient including, but not limited to bleeding, infection, contrast induced renal failure, filter fracture or migration which can lead to emergency surgery or even death, strut penetration with damage or irritation to adjacent structures and caval thrombosis.  All of the patient's questions were answered, patient is agreeable to proceed. Consent signed and in chart.  Thank you for allowing our service to participate in DANNY ZIMNY 's care.  Electronically Signed: Lavanda JAYSON Jurist, PA-C   04/07/2024, 1:54 PM    I spent a total of 40 Minutes    in  face to face in clinical consultation, greater than 50% of which was counseling/coordinating care for image guided inferior vena cava filter placement

## 2024-04-08 ENCOUNTER — Inpatient Hospital Stay (HOSPITAL_COMMUNITY)

## 2024-04-08 DIAGNOSIS — Z95828 Presence of other vascular implants and grafts: Secondary | ICD-10-CM

## 2024-04-08 DIAGNOSIS — S7012XA Contusion of left thigh, initial encounter: Secondary | ICD-10-CM

## 2024-04-08 DIAGNOSIS — N1832 Chronic kidney disease, stage 3b: Secondary | ICD-10-CM | POA: Diagnosis not present

## 2024-04-08 DIAGNOSIS — M7981 Nontraumatic hematoma of soft tissue: Secondary | ICD-10-CM

## 2024-04-08 DIAGNOSIS — I2699 Other pulmonary embolism without acute cor pulmonale: Secondary | ICD-10-CM | POA: Diagnosis not present

## 2024-04-08 DIAGNOSIS — I7090 Unspecified atherosclerosis: Secondary | ICD-10-CM

## 2024-04-08 DIAGNOSIS — I82432 Acute embolism and thrombosis of left popliteal vein: Secondary | ICD-10-CM

## 2024-04-08 DIAGNOSIS — Z9889 Other specified postprocedural states: Secondary | ICD-10-CM

## 2024-04-08 DIAGNOSIS — I82412 Acute embolism and thrombosis of left femoral vein: Secondary | ICD-10-CM

## 2024-04-08 DIAGNOSIS — I82442 Acute embolism and thrombosis of left tibial vein: Secondary | ICD-10-CM

## 2024-04-08 LAB — TYPE AND SCREEN
ABO/RH(D): O POS
Antibody Screen: NEGATIVE
Unit division: 0
Unit division: 0

## 2024-04-08 LAB — CBC
HCT: 29.5 % — ABNORMAL LOW (ref 36.0–46.0)
Hemoglobin: 9.8 g/dL — ABNORMAL LOW (ref 12.0–15.0)
MCH: 29.7 pg (ref 26.0–34.0)
MCHC: 33.2 g/dL (ref 30.0–36.0)
MCV: 89.4 fL (ref 80.0–100.0)
Platelets: 123 10*3/uL — ABNORMAL LOW (ref 150–400)
RBC: 3.3 MIL/uL — ABNORMAL LOW (ref 3.87–5.11)
RDW: 15.4 % (ref 11.5–15.5)
WBC: 12.5 10*3/uL — ABNORMAL HIGH (ref 4.0–10.5)
nRBC: 0.2 % (ref 0.0–0.2)

## 2024-04-08 LAB — GLUCOSE, CAPILLARY
Glucose-Capillary: 96 mg/dL (ref 70–99)
Glucose-Capillary: 148 mg/dL — ABNORMAL HIGH (ref 70–99)
Glucose-Capillary: 115 mg/dL — ABNORMAL HIGH (ref 70–99)
Glucose-Capillary: 120 mg/dL — ABNORMAL HIGH (ref 70–99)

## 2024-04-08 LAB — BASIC METABOLIC PANEL WITH GFR
Anion gap: 13 (ref 5–15)
BUN: 26 mg/dL — ABNORMAL HIGH (ref 8–23)
CO2: 20 mmol/L — ABNORMAL LOW (ref 22–32)
Calcium: 8.2 mg/dL — ABNORMAL LOW (ref 8.9–10.3)
Chloride: 108 mmol/L (ref 98–111)
Creatinine, Ser: 1.93 mg/dL — ABNORMAL HIGH (ref 0.44–1.00)
GFR, Estimated: 25 mL/min — ABNORMAL LOW (ref >60)
Glucose, Bld: 96 mg/dL (ref 70–99)
Potassium: 4.3 mmol/L (ref 3.5–5.1)
Sodium: 141 mmol/L (ref 135–145)

## 2024-04-08 LAB — VAS US ABI WITH/WO TBI

## 2024-04-08 LAB — BPAM RBC
Blood Product Expiration Date: 202508052359
Blood Product Expiration Date: 202508052359
ISSUE DATE / TIME: 202507010755
ISSUE DATE / TIME: 202507011005
ISSUING PHYSICIAN: 202507010755
Unit Type and Rh: 202508052359
Unit Type and Rh: 5100
Unit Type and Rh: 5100

## 2024-04-08 NOTE — TOC Initial Note (Signed)
 Transition of Care [facility]) - Initial/Assessment Note    Patient Details  Name: Julie [name] MRN: [id number] Date of Birth: May 21, 1935  [facility]) CM/SW Contact:    Inocente GORMAN Kindle, LCSW Phone Number: 04/08/2024, 12:47 PM  Clinical Narrative:                 CSW received consult for possible SNF placement from MD at time of discharge. CSW spoke with patient who was very pleasant. Patient reported that she lives alone (husband passed many years ago suddenly) and may need strengthening given patient's current physical needs and fall risk. Patient expressed being agreeable to SNF placement at time of discharge. Patient reports preference for [facility] only. CSW discussed insurance authorization process, potential of a semi private room, and will provide Medicare SNF ratings list. CSW will send out referrals for review and provide bed offers as available once recommended by therapy.   Skilled Nursing Rehab Facilities-   [email or web address]   Ratings out of 5 stars (5 the highest)  Name Address  Phone # Quality Care Staffing Health Inspection Overall  [facility] [address] [phone] 3 1 4 3   [facility] [address], [place] [phone] 5 2 4 5   [facility] [address], [facility] [phone] [facility] [address] [place] [phone] 4 3 4 4   [facility]  [address], [place] [phone] 5 3 5 5   [facility] [address], [facility] [phone] 5 3 2 3   [facility] [address] [place] [phone] 5 1 2 2   [facility] [address], [place] [phone] 2 3 4 4   [address] ([place]) [address], [place] [phone] 1 3 3 2   [facility] [address] [place], [place] [phone] 3 1 2 1   [facility] [address], [place] [phone] 3 1 1 1   [facility]otilda Pereyra [address] [place], [place] [phone] 3 3 4 4   [facility] [address], [place] [phone] 4 1 2 1   [facility]) [address], [place] [phone] 1 1 4 2           [facility] [address] [place] [phone] 3 1 5 4   [facility] [address], [place] [phone] 4 1 1 1   [facility]  [address] [place] [phone] 2 3 1 1   [facility] [place] [address] [phone] 2 2 4 4   [facility] [address] [place] [address], [place] [phone] 2 2 3 3           [facility] [address], [place] [phone] 2 1 2 1   [facility] (No [facility]) [address], [place] [phone] 5 4 4 5   [facility] [address] [facility] [phone] 3 4 4 4   [facility] [address], [place] [phone] 3 1 2 1   [place] [address] [place] [phone] 3 2 2 2   [facility] [address] [place] [phone] 1 3 3 2   [facility] [address] [place] [phone] 2 2 3 3   [facility] [address] [place], [place] [phone] 2 1 4 3   [facility] [address], [facility] [phone] 2 1 1 1   [facility] [address], [place] [phone] 2 1 2 1   [facility] [address] [place] [phone] 3 1 2 1           [facility] [address], [place] [phone] 4 1 2 1   [facility] [address], [place]  [phone] 3 4 4 4   [facility] (No Humana) [address], [place] [phone] 3 2 5 5   [facility]) [address], [place] [phone] 2 2 3 3   [facility] [place] [address], [place] [phone] 5 3 5 5   [facility] [address], [place] [phone] 2 1 1 1   [facility] [address] [place], [place] [phone] 3 5 5 5           [facility] [address] [place], [place] [phone]  5 4 5 5   [facility])  [address], [place] [phone] 1 1 2 1   [facility]) [address] [place] [phone]  2 4 4   [facility] [address], [place] [phone] 3 5 5 5   [address] [place], [place] [phone] 4 2 2 2   [facility]) [address] [place] [phone] 1 1 3 1   [facility] [address] [place] [phone] 2 2 2 2      Expected Discharge Plan: Skilled Nursing Facility Barriers to Discharge: English as a second language teacher, Continued Medical Work up, SNF Pending bed offer   Patient Goals and CMS Choice Patient states their goals for this hospitalization and ongoing recovery are:: Rehab CMS Medicare.gov Compare Post Acute Care list provided to:: Patient Choice offered to / list presented to : Patient [place] ownership interest in [facility].provided to:: Patient    Expected Discharge Plan and Services In-house Referral: Clinical Social Work   Post Acute Care Choice: Skilled Nursing Facility Living arrangements for the past 2 months: Apartment                                      Prior Living Arrangements/Services Living arrangements for the past 2 months: Apartment Lives with:: Self Patient language and need for interpreter reviewed:: Yes Do you feel safe going back to the place where you live?: Yes      Need for Family Participation in Patient Care: Yes (Comment) Care giver support system in place?: Yes (comment)   Criminal Activity/Legal Involvement Pertinent to Current Situation/Hospitalization: No - Comment as needed  Activities of Daily Living      Permission Sought/Granted Permission sought to share information with : Facility Medical sales representative, Family Supports Permission granted to share information with : Yes, Verbal Permission Granted  Share Information with NAME: Vivian  Permission granted to share info w  AGENCY: SNFs  Permission granted to share info w Relationship: Niece  Permission granted to share info w Contact Information: [phone]  Emotional Assessment Appearance:: Appears stated age Attitude/Demeanor/Rapport: Engaged, Gracious Affect (typically observed): Accepting, Pleasant, Appropriate Orientation: : Oriented to Self, Oriented to Place, Oriented to  Time, Oriented to Situation Alcohol / Substance Use: Not Applicable Psych Involvement: No (comment)  Admission diagnosis:  Leg hematoma [S80.10XA] Bilateral pulmonary embolism (HCC) [I26.99] Limb ischemia [I99.8] Nontraumatic rectus hematoma [M79.81] Thigh hematoma, left, initial encounter [S70.12XA] Patient Active Problem List   Diagnosis Date Noted   Leg hematoma 04/06/2024   Rectus sheath hematoma 04/06/2024   PVD (peripheral vascular disease) (HCC) 04/06/2024   Bilateral pulmonary embolism (HCC) 04/06/2024  Lactic acidosis 04/06/2024   Leukocytosis 04/06/2024   Nausea and vomiting 04/06/2024   Hypocalcemia 04/06/2024   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 [place] [address] Phone: [phone] Fax: [phone]  [facility] - [place], [place] - [address] [place] [address] Phone: [phone] Fax: [phone]  [facility] [address] [place] [address] Phone: [phone] Fax: [phone]     Social Drivers of Health (SDOH) Social  History: SDOH Screenings   Food Insecurity: No Food Insecurity (04/07/2024)  Housing: Low Risk  (04/07/2024)  Transportation Needs: No Transportation Needs (04/07/2024)  Utilities: Not At Risk (04/07/2024)  Social Connections: Moderately Isolated (04/07/2024)  Tobacco Use: Medium Risk (04/06/2024)   SDOH Interventions:     Readmission Risk Interventions     No data to display

## 2024-04-08 NOTE — Evaluation (Signed)
 Physical Therapy Evaluation Patient Details Name: Julie [name] MRN: [id number] DOB: Aug 19, 1935 Today's Date: 04/08/2024  History of Present Illness  89 y.o. female [facility] with LLE pain/numbness. Pt with hematoma of L thigh and rectus femoris. Pt also with B acute PE and acute LLE DVT. 7/1 IVC filter. PMHx: hypertension, hypothyroidism, gout, GERD, CKD  Clinical Impression  Pt in bed upon arrival and agreeable to PT eval. PTA, pt was ModI with rollator for mobility and had assist from PCA for bathing, driving, and cleaning. In today's session, pt was limited by pain in LLE at rest and with mobility. ModA required for bed mobility and to perform step-pivot with RW to recliner. Assist needed for RW management and steadying assistance. Limited WB on LLE with poor eccentric lowering into recliner. Pt has intermittent level of assist available at home. Recommending post-acute rehab <3hrs to work towards independence with mobility. Pt would benefit from acute skilled PT with current functional limitations listed below (see PT Problem List). Acute PT to follow.         If plan is discharge home, recommend the following: A lot of help with walking and/or transfers;A lot of help with bathing/dressing/bathroom;Assistance with cooking/housework;Assist for transportation;Help with stairs or ramp for entrance   Can travel by private vehicle   No    Equipment Recommendations None recommended by PT     Functional Status Assessment Patient has had a recent decline in their functional status and demonstrates the ability to make significant improvements in function in a reasonable and predictable amount of time.     Precautions / Restrictions Precautions Precautions: Fall Recall of Precautions/Restrictions: Intact Precaution/Restrictions Comments: LLE hematoma Restrictions Weight Bearing Restrictions Per Provider Order: No      Mobility  Bed Mobility Overal bed mobility: Needs  Assistance Bed Mobility: Rolling, Sidelying to Sit Rolling: Min assist Sidelying to sit: Mod assist     General bed mobility comments: able to bring R LE off EOB with assistance needed for L LE management. MinA to roll and ModA to raise trunk. Assist needed to shift hips forward with bed pad    Transfers Overall transfer level: Needs assistance Equipment used: Rolling walker (2 wheels) Transfers: Sit to/from Stand, Bed to chair/wheelchair/BSC Sit to Stand: Mod assist   Step pivot transfers: Mod assist     General transfer comment: ModA for boost-up with cues for hand placement. Forward flexed posture with limited WB on LLE. Able to perform step-pivot to the right with poor eccentric control. Assist needed to turn RW.    Ambulation/Gait    General Gait Details: unable this date    Balance Overall balance assessment: Needs assistance, Mild deficits observed, not formally tested, History of Falls Sitting-balance support: No upper extremity supported, Feet supported Sitting balance-Leahy Scale: Fair     Standing balance support: Bilateral upper extremity supported, During functional activity, Reliant on assistive device for balance Standing balance-Leahy Scale: Poor Standing balance comment: reliant on RW and external support       Pertinent Vitals/Pain Pain Assessment Pain Assessment: Faces Faces Pain Scale: Hurts even more Pain Location: L LE Pain Descriptors / Indicators: Aching, Discomfort Pain Intervention(s): Limited activity within patient's tolerance, Monitored during session, Repositioned    Home Living Family/patient expects to be discharged to:: Private residence Living Arrangements: Alone Available Help at Discharge: Family;Available PRN/intermittently Type of Home: Apartment Home Access: Level entry;Elevator       Home Layout: One level Home Equipment: Grab bars - tub/shower;Grab  bars - toilet;Rolling Walker (2 wheels);Rollator (4 wheels);Shower  seat;BSC/3in1 (walking stick) Additional Comments: ILF, [place]    Prior Function Prior Level of Function : Needs assist      Mobility Comments: ModI with rollator ADLs Comments: PCA 3 hrs for 3 days a week for cleaning, bathing. Pt will do bulk meal prepping. Family or aide will drive     Extremity/Trunk Assessment   Upper Extremity Assessment Upper Extremity Assessment: Defer to OT evaluation    Lower Extremity Assessment Lower Extremity Assessment: LLE deficits/detail LLE Deficits / Details: Limited knee flexion/hip flexion ROM 2/2 pain. At least 3/5 with deferring MMT due to pain. Edema throughout leg and foot LLE: Unable to fully assess due to pain LLE Sensation: WNL    Cervical / Trunk Assessment Cervical / Trunk Assessment: Normal  Communication   Communication Communication: Impaired Factors Affecting Communication: Hearing impaired    Cognition Arousal: Alert Behavior During Therapy: WFL for tasks assessed/performed   PT - Cognitive impairments: No apparent impairments     Following commands: Intact       Cueing Cueing Techniques: Verbal cues, Tactile cues     General Comments General comments (skin integrity, edema, etc.): VSS on RA. Discussed performing ankle pumps, heel slides, and quad sets throughout the day    Exercises General Exercises - Lower Extremity Ankle Circles/Pumps: AROM, Both, 5 reps, Supine      PT Assessment Patient needs continued PT services  PT Problem List Decreased strength;Decreased range of motion;Decreased activity tolerance;Decreased balance;Decreased mobility       PT Treatment Interventions DME instruction;Gait training;Functional mobility training;Therapeutic exercise;Therapeutic activities;Balance training;Neuromuscular re-education;Patient/family education    PT Goals (Current goals can be found in the Care Plan section)  Acute Rehab PT Goals Patient Stated Goal: to get more rehab and get out of the  hospital PT Goal Formulation: With patient Time For Goal Achievement: 04/22/24 Potential to Achieve Goals: Good    Frequency Min 2X/week        AM-PAC PT 6 Clicks Mobility  Outcome Measure Help needed turning from your back to your side while in a flat bed without using bedrails?: A Little Help needed moving from lying on your back to sitting on the side of a flat bed without using bedrails?: A Lot Help needed moving to and from a bed to a chair (including a wheelchair)?: A Lot Help needed standing up from a chair using your arms (e.g., wheelchair or bedside chair)?: A Lot Help needed to walk in hospital room?: Total Help needed climbing 3-5 steps with a railing? : Total 6 Click Score: 11    End of Session Equipment Utilized During Treatment: Gait belt Activity Tolerance: Patient limited by pain Patient left: in chair;with call bell/phone within reach;with chair alarm set Nurse Communication: Mobility status PT Visit Diagnosis: Unsteadiness on feet (R26.81);Other abnormalities of gait and mobility (R26.89);Muscle weakness (generalized) (M62.81);History of falling (Z91.81)    Time: 8674-8650 PT Time Calculation (min) (ACUTE ONLY): 24 min   Charges:   PT Evaluation $PT Eval Low Complexity: 1 Low   PT General Charges $$ ACUTE PT VISIT: 1 Visit       Kate ORN, PT, DPT Secure Chat Preferred  Rehab Office [phone]   Kate BRAVO Wendolyn 04/08/2024, 2:30 PM

## 2024-04-08 NOTE — Evaluation (Signed)
 Occupational Therapy Evaluation Patient Details Name: Julie [name] MRN: [id number] DOB: [birth year] Today's Date: [name]   History of Present Illness   89 y.o. female presents to [facility] 04/06/24 with LLE pain/numbness. Pt with hematoma of L thigh and rectus femoris. Pt also with B acute PE and acute LLE DVT. 7/1 IVC filter. PMHx: hypertension, hypothyroidism, gout, GERD, CKD     Clinical Impressions Pt c/o pain to LLE, limiting mobility. Pt lives alone in ILF, has aide 3X/wk but mostly independent PLOF with rollator. Pt at this time requires min/mod A for dressing/bathing, not able to take more than a couple of steps from bed to recliner at this time due to pain in LLE. Recommending postacute rehab <3hrs/day to maximize functional independence with ADLs/mobility, will continue to see acutely to progress as able.      If plan is discharge home, recommend the following:   A lot of help with walking and/or transfers;A little help with bathing/dressing/bathroom;Assistance with cooking/housework;Assist for transportation;Help with stairs or ramp for entrance     Functional Status Assessment   Patient has had a recent decline in their functional status and demonstrates the ability to make significant improvements in function in a reasonable and predictable amount of time.     Equipment Recommendations   Other (comment) (defer)     Recommendations for Other Services         Precautions/Restrictions   Precautions Precautions: Fall Recall of Precautions/Restrictions: Intact Precaution/Restrictions Comments: LLE hematoma Restrictions Weight Bearing Restrictions Per Provider Order: No     Mobility Bed Mobility Overal bed mobility: Needs Assistance Bed Mobility: Sit to Supine       Sit to supine: Mod assist   General bed mobility comments: mod A for return to bed for LLE    Transfers Overall transfer level: Needs assistance Equipment used: Rolling walker (2  wheels) Transfers: Sit to/from Stand, Bed to chair/wheelchair/BSC Sit to Stand: Mod assist     Step pivot transfers: Mod assist     General transfer comment: mod A with RW, pain in LLE limiting tolerance      Balance Overall balance assessment: Needs assistance Sitting-balance support: No upper extremity supported, Feet supported Sitting balance-Leahy Scale: Fair     Standing balance support: Bilateral upper extremity supported, During functional activity, Reliant on assistive device for balance Standing balance-Leahy Scale: Poor Standing balance comment: reliant on RW and external support                           ADL either performed or assessed with clinical judgement   ADL Overall ADL's : Needs assistance/impaired Eating/Feeding: Independent   Grooming: Set up;Sitting   Upper Body Bathing: Minimal assistance;Sitting   Lower Body Bathing: Maximal assistance;Sitting/lateral leans   Upper Body Dressing : Minimal assistance;Sitting   Lower Body Dressing: Maximal assistance;Sitting/lateral leans   Toilet Transfer: Moderate assistance;Rolling walker (2 wheels);BSC/3in1   Toileting- Clothing Manipulation and Hygiene: Moderate assistance;Sitting/lateral lean;Sit to/from stand         General ADL Comments: Pt min/mod for UB/LB dressing and bathing, set up for grooming/feeding sitting. Pt has LLE pain limiting her ability to stand and complete transfers or steps without mod A using RW     Vision Baseline Vision/History: 1 Wears glasses Ability to See in Adequate Light: 0 Adequate Patient Visual Report: No change from baseline       Perception         Praxis  Pertinent Vitals/Pain Pain Assessment Pain Assessment: Faces Faces Pain Scale: Hurts even more Pain Location: L LE Pain Descriptors / Indicators: Aching, Discomfort Pain Intervention(s): Monitored during session     Extremity/Trunk Assessment Upper Extremity Assessment Upper  Extremity Assessment: Generalized weakness;RUE deficits/detail;LUE deficits/detail RUE Deficits / Details: Pt has history of R shoudler injury, poor AROM at shoulder, weak grip, poor FM RUE: Shoulder pain with ROM RUE Coordination: decreased fine motor;decreased gross motor LUE Deficits / Details: overall weak grip, poor FM LUE Coordination: decreased fine motor   Lower Extremity Assessment Lower Extremity Assessment: Defer to PT evaluation LLE Deficits / Details: Limited knee flexion/hip flexion ROM 2/2 pain. At least 3/5 with deferring MMT due to pain. Edema throughout leg and foot LLE: Unable to fully assess due to pain LLE Sensation: WNL   Cervical / Trunk Assessment Cervical / Trunk Assessment: Normal   Communication Communication Communication: Impaired Factors Affecting Communication: Hearing impaired   Cognition Arousal: Alert Behavior During Therapy: WFL for tasks assessed/performed Cognition: No apparent impairments                               Following commands: Intact       Cueing  General Comments   Cueing Techniques: Verbal cues;Tactile cues  VSS on RA. Discussed performing ankle pumps, heel slides, and quad sets throughout the day   Exercises     Shoulder Instructions      Home Living Family/patient expects to be discharged to:: Private residence Living Arrangements: Alone Available Help at Discharge: Family;Available PRN/intermittently Type of Home: Apartment Home Access: Level entry;Elevator     Home Layout: One level     Bathroom Shower/Tub: Producer, television/film/video: Handicapped height Bathroom Accessibility: Yes   Home Equipment: Grab bars - tub/shower;Grab bars - toilet;Rolling Walker (2 wheels);Rollator (4 wheels);Shower seat;BSC/3in1   Additional Comments: ILF, [place]      Prior Functioning/Environment Prior Level of Function : Needs assist             Mobility Comments: ModI with rollator ADLs  Comments: PCA 3 hrs for 3 days a week for cleaning, bathing. Pt will do bulk meal prepping. Family or aide will drive    OT Problem List: Decreased strength;Decreased range of motion;Decreased activity tolerance;Impaired balance (sitting and/or standing);Pain;Impaired UE functional use   OT Treatment/Interventions: Self-care/ADL training;Therapeutic exercise;DME and/or AE instruction;Energy conservation;Therapeutic activities;Patient/family education;Balance training      OT Goals(Current goals can be found in the care plan section)   Acute Rehab OT Goals Patient Stated Goal: to manage pain OT Goal Formulation: With patient Time For Goal Achievement: 04/22/24 Potential to Achieve Goals: Good   OT Frequency:  Min 2X/week    Co-evaluation              AM-PAC OT 6 Clicks Daily Activity     Outcome Measure Help from another person eating meals?: None Help from another person taking care of personal grooming?: A Little Help from another person toileting, which includes using toliet, bedpan, or urinal?: A Lot Help from another person bathing (including washing, rinsing, drying)?: A Lot Help from another person to put on and taking off regular upper body clothing?: A Little Help from another person to put on and taking off regular lower body clothing?: A Lot 6 Click Score: 16   End of Session Equipment Utilized During Treatment: Gait belt;Rolling walker (2 wheels) Nurse Communication: Mobility status  Activity  Tolerance: Patient limited by pain Patient left: in bed;with call bell/phone within reach  OT Visit Diagnosis: Unsteadiness on feet (R26.81);Other abnormalities of gait and mobility (R26.89);Muscle weakness (generalized) (M62.81);Pain Pain - Right/Left: Left Pain - part of body: Leg                Time: 8576-8544 OT Time Calculation (min): 32 min Charges:  OT General Charges $OT Visit: 1 Visit OT Evaluation $OT Eval Moderate Complexity: 1 Mod OT Treatments $Self  Care/Home Management : 8-22 mins  [place], OTR/L   Julie [name], 3:09 PM

## 2024-04-08 NOTE — NC FL2 (Signed)
 [place]  MEDICAID FL2 LEVEL OF CARE FORM     IDENTIFICATION  Patient Name: Julie [name] Birthdate: 12-11-1934 Sex: female Admission Date (Current Location): 04/06/2024  [facility] and [place] Number:  Producer, television/film/video and Address:  [facility] [place], [address], [place], [place] [address]      Provider Number: [id number]  Attending Physician Name and Address:  Raenelle Donalda HERO, MD  Relative Name and Phone Number:       Current Level of Care: Hospital Recommended Level of Care: Skilled Nursing Facility Prior Approval Number:    Date Approved/Denied:   PASRR Number: [id number]  Discharge Plan: SNF    Current Diagnoses: Patient Active Problem List   Diagnosis Date Noted   Leg hematoma 04/06/2024   Rectus sheath hematoma 04/06/2024   PVD (peripheral vascular disease) (HCC) 04/06/2024   Bilateral pulmonary embolism (HCC) 04/06/2024   Lactic acidosis 04/06/2024   Leukocytosis 04/06/2024   Nausea and vomiting 04/06/2024   Hypocalcemia 04/06/2024   Duodenal adenoma 07/11/2023   PUD (peptic ulcer disease) 07/11/2023   Acute upper abdominal pain 07/11/2023   Gastric ulcer 12/17/2021   Essential hypertension 12/15/2021   Hypothyroidism 12/15/2021   Type 2 diabetes mellitus without complication, with long-term current use of insulin (HCC) 12/15/2021   Severe anemia 12/15/2021   Chronic kidney disease, stage 3b (HCC) 12/15/2021    Orientation RESPIRATION BLADDER Height & Weight     Self, Time, Situation, Place  Normal Continent, Indwelling catheter Weight: 145 lb 8.1 oz (66 kg) Height:  5' 4 (162.6 cm)  BEHAVIORAL SYMPTOMS/MOOD NEUROLOGICAL BOWEL NUTRITION STATUS      Continent Diet (See dc summary)  AMBULATORY STATUS COMMUNICATION OF NEEDS Skin   Extensive Assist Verbally Normal                       Personal Care Assistance Level of Assistance  Bathing, Feeding, Dressing Bathing Assistance: Maximum assistance Feeding assistance:  Independent Dressing Assistance: Limited assistance     Functional Limitations Info  Sight, Hearing Sight Info: Impaired Hearing Info: Impaired      SPECIAL CARE FACTORS FREQUENCY  PT (By licensed PT), OT (By licensed OT)     PT Frequency: 5x week OT Frequency: 5x/week            Contractures Contractures Info: Not present    Additional Factors Info  Code Status, Allergies Code Status Info: DNR Allergies Info: Ace Inhibitors, Advair Hfa (Fluticasone-salmeterol), Amrix (Cyclobenzaprine Hcl), Asa (Aspirin), Lyrica (Pregabalin), Nsaids, Zocor (Simvastatin), Adalat (Nifedipine)           Current Medications (04/08/2024):  This is the current hospital active medication list Current Facility-Administered Medications  Medication Dose Route Frequency Provider Last Rate Last Admin   acetaminophen  (TYLENOL ) tablet 650 mg  650 mg Oral Q6H PRN Smith, Rondell A, MD       Or   acetaminophen  (TYLENOL ) suppository 650 mg  650 mg Rectal Q6H PRN Claudene, Rondell A, MD       albuterol (PROVENTIL) (2.5 MG/3ML) 0.083% nebulizer solution 2.5 mg  2.5 mg Nebulization Q6H PRN Smith, Rondell A, MD       Chlorhexidine Gluconate Cloth 2 % PADS 6 each  6 each Topical Daily Elgergawy, Dawood S, MD   6 each at 04/08/24 9147   colchicine tablet 0.6 mg  0.6 mg Oral Daily PRN Smith, Rondell A, MD       dextrose 50 % solution 50 mL  1  ampule Intravenous PRN Claudene Maximino LABOR, MD       LORazepam  (ATIVAN ) tablet 1 mg  1 mg Oral TID PRN Smith, Rondell A, MD       ondansetron  (ZOFRAN ) injection 4 mg  4 mg Intravenous Q6H PRN Claudene Toribio BROCKS, MD   4 mg at 04/06/24 1016   pantoprazole  (PROTONIX ) EC tablet 40 mg  40 mg Oral Daily Pham, Minh Q, RPH-CPP   40 mg at 04/08/24 0849   prochlorperazine (COMPAZINE) injection 10 mg  10 mg Intravenous Q6H PRN Smith, Rondell A, MD   10 mg at 04/06/24 1222   sodium chloride  flush (NS) 0.9 % injection 3 mL  3 mL Intravenous Q12H Smith, Rondell A, MD   3 mL at 04/08/24 0851    traMADol  (ULTRAM ) tablet 50 mg  50 mg Oral Q6H PRN Smith, Rondell A, MD   50 mg at 04/08/24 9150     Discharge Medications: Please see discharge summary for a list of discharge medications.  Relevant Imaging Results:  Relevant Lab Results:   Additional Information SSN : [id number]  Inocente RAMAN Shanyia Stines, LCSW

## 2024-04-08 NOTE — Plan of Care (Signed)

## 2024-04-08 NOTE — Progress Notes (Addendum)
  Progress Note    04/08/2024 8:55 AM * No surgery found *  Subjective:  L thigh pain   Vitals:   04/08/24 0400 04/08/24 0740  BP: 119/65 131/73  Pulse: 86 85  Resp: 15 13  Temp: 98.8 F (37.1 C) 98.5 F (36.9 C)  SpO2: 94% 99%   Physical Exam: Lungs:  non labored Extremities:  L foot warm with motor and sensation intact Neurologic: A&O  CBC    Component Value Date/Time   WBC 12.5 (H) 04/08/2024 0545   RBC 3.30 (L) 04/08/2024 0545   HGB 9.8 (L) 04/08/2024 0545   HCT 29.5 (L) 04/08/2024 0545   PLT 123 (L) 04/08/2024 0545   MCV 89.4 04/08/2024 0545   MCH 29.7 04/08/2024 0545   MCHC 33.2 04/08/2024 0545   RDW 15.4 04/08/2024 0545   LYMPHSABS 4.2 (H) 04/06/2024 0428   MONOABS 0.8 04/06/2024 0428   EOSABS 0.0 04/06/2024 0428   BASOSABS 0.1 04/06/2024 0428    BMET    Component Value Date/Time   NA 141 04/08/2024 0545   K 4.3 04/08/2024 0545   CL 108 04/08/2024 0545   CO2 20 (L) 04/08/2024 0545   GLUCOSE 96 04/08/2024 0545   BUN 26 (H) 04/08/2024 0545   CREATININE 1.93 (H) 04/08/2024 0545   CALCIUM 8.2 (L) 04/08/2024 0545   GFRNONAA 25 (L) 04/08/2024 0545   GFRAA 50 (L) 08/13/2016 1331    INR    Component Value Date/Time   INR 1.5 (H) 04/06/2024 0614     Intake/Output Summary (Last 24 hours) at 04/08/2024 0855 Last data filed at 04/08/2024 9161 Gross per 24 hour  Intake 760.15 ml  Output 1000 ml  Net -239.85 ml    Assessment/Plan:  89 y.o. female with L thigh hematoma   L foot warm to touch with motor and sensation intact ABIs pending She received her IVC filter yesterday with IR Ok to mobilize; will need compression and elevation to prevent post thrombotic syndrome long term   Donnice Sender, PA-C [facility] [phone] 04/08/2024 8:55 AM  VASCULAR STAFF ADDENDUM: I have independently interviewed and examined the patient. I agree with the above.  Left thigh pain likely related to her DVT. Essentially asymptomatic from  an arterial perspective on the left leg, she came in with concern for limb ischemia although once there was improvement in her blood pressure her discoloration resolved and her pain was much better. Will obtain ABI in order to trend with a 4-month follow-up visit but does not require any vascular intervention at this time.  DVTs limited to the distal femoral vein, popliteal and tibial veins and therefore does not warrant any intervention and should just be continued on anticoagulation.  Norman GORMAN Serve MD [facility] Phone Number: [phone] 04/08/2024 10:29 AM

## 2024-04-09 ENCOUNTER — Inpatient Hospital Stay (HOSPITAL_COMMUNITY)

## 2024-04-09 ENCOUNTER — Other Ambulatory Visit (HOSPITAL_COMMUNITY)

## 2024-04-09 DIAGNOSIS — I2699 Other pulmonary embolism without acute cor pulmonale: Secondary | ICD-10-CM | POA: Diagnosis not present

## 2024-04-09 DIAGNOSIS — N1832 Chronic kidney disease, stage 3b: Secondary | ICD-10-CM | POA: Diagnosis not present

## 2024-04-09 DIAGNOSIS — M7981 Nontraumatic hematoma of soft tissue: Secondary | ICD-10-CM | POA: Diagnosis not present

## 2024-04-09 DIAGNOSIS — S7012XA Contusion of left thigh, initial encounter: Secondary | ICD-10-CM | POA: Diagnosis not present

## 2024-04-09 LAB — BASIC METABOLIC PANEL WITH GFR
Anion gap: 15 (ref 5–15)
BUN: 48 mg/dL — ABNORMAL HIGH (ref 8–23)
CO2: 15 mmol/L — ABNORMAL LOW (ref 22–32)
Calcium: 8.5 mg/dL — ABNORMAL LOW (ref 8.9–10.3)
Chloride: 107 mmol/L (ref 98–111)
Creatinine, Ser: 3.44 mg/dL — ABNORMAL HIGH (ref 0.44–1.00)
GFR, Estimated: 12 mL/min — ABNORMAL LOW (ref >60)
Glucose, Bld: 109 mg/dL — ABNORMAL HIGH (ref 70–99)
Potassium: 5.7 mmol/L — ABNORMAL HIGH (ref 3.5–5.1)
Sodium: 137 mmol/L (ref 135–145)

## 2024-04-09 LAB — CBC
HCT: 34.9 % — ABNORMAL LOW (ref 36.0–46.0)
Hemoglobin: 11.3 g/dL — ABNORMAL LOW (ref 12.0–15.0)
MCH: 30.1 pg (ref 26.0–34.0)
MCHC: 32.4 g/dL (ref 30.0–36.0)
MCV: 92.8 fL (ref 80.0–100.0)
Platelets: 146 10*3/uL — ABNORMAL LOW (ref 150–400)
RBC: 3.76 MIL/uL — ABNORMAL LOW (ref 3.87–5.11)
RDW: 15.2 % (ref 11.5–15.5)
WBC: 16.3 10*3/uL — ABNORMAL HIGH (ref 4.0–10.5)
nRBC: 0.2 % (ref 0.0–0.2)

## 2024-04-09 LAB — GLUCOSE, CAPILLARY
Glucose-Capillary: 134 mg/dL — ABNORMAL HIGH (ref 70–99)
Glucose-Capillary: 144 mg/dL — ABNORMAL HIGH (ref 70–99)
Glucose-Capillary: 132 mg/dL — ABNORMAL HIGH (ref 70–99)
Glucose-Capillary: 142 mg/dL — ABNORMAL HIGH (ref 70–99)

## 2024-04-09 LAB — CK: Total CK: 111 U/L (ref 38–234)

## 2024-04-09 MED ORDER — SODIUM ZIRCONIUM CYCLOSILICATE 10 G PO PACK
10.0000 g | PACK | Freq: Two times a day (BID) | ORAL | Status: AC
  Administered 2024-04-09 (×2): 10 g via ORAL
  Filled 2024-04-09 (×2): qty 1

## 2024-04-09 MED ORDER — SODIUM CHLORIDE 0.9 % IV SOLN: INTRAVENOUS | Status: DC

## 2024-04-09 MED ORDER — SODIUM BICARBONATE 8.4 % IV SOLN
INTRAVENOUS | Status: DC
  Filled 2024-04-09 (×2): qty 1000

## 2024-04-09 NOTE — Plan of Care (Signed)

## 2024-04-09 NOTE — Progress Notes (Signed)
 PROGRESS NOTE        PATIENT DETAILS Name: Julie [name] Age: 89 y.o. Sex: female Date of Birth: 11/08/1934 Admit Date: 04/06/2024 Admitting Physician Maximino DELENA Sharps, MD ERE:Rnoopwd, Lonell, DO  Brief Summary: Patient is a 89 y.o.  female with history of HTN, hypothyroidism, GERD, advanced arthritis, chronic neuropathy-near bedbound status-who presented with sudden onset of left leg pain that woke her up around 2 am on the day of her presentation-she was evaluated in the ED-due to concern for potential ischemic LLE-she underwent CTA study-which showed rectus sheath/left thigh hematoma as well as pulmonary emboli.  She was briefly hypotensive in the ED but responded to IVF bolus. She was evaluated by PCCM-not felt to require intervention for PE-also evaluated by vascular surgery-thought to have patent vasculature bilaterally.  She was subsequently admitted to the hospitalist service.  Significant events: 6/30>> presented with sudden onset of left leg pain-found to have left rectus muscle/left thigh hematoma and PE.  Significant studies: 6/30>> CTA abdominal aorta with iliofemoral runoff: Partially visible pulmonary emboli, moderate size left rectus muscle hematoma, additional long segment left thigh intramuscular hematoma, advanced femoral/bilateral lower extremity calcified atherosclerosis. 6/30>> TTE: EF> 75% 7/01>> B/L LE Doppler: Acute DVT left femoral/left popliteal/left tibial/left peroneal vein  Significant microbiology data: None  Procedures: 7/01>> IVC filter placement  Consults: Vascular surgery PCCM IR  Subjective: No issues overnight-appears unchanged-continues to have unchanged left-sided thigh swelling  Objective: Vitals: Blood pressure 108/74, pulse (!) 101, temperature (!) 97.5 F (36.4 C), temperature source Oral, resp. rate 15, height 5' 4 (1.626 m), weight 66 kg, SpO2 98%.   Exam: Awake/alert Not in distress Abdomen: Soft  nontender nondistended Extremities: Continues to have unchanged dyspnea Nonfocal exam.  Pertinent Labs/Radiology:    Latest Ref Rng & Units 04/09/2024    5:52 AM 04/08/2024    5:45 AM 04/07/2024    4:30 PM  CBC  WBC 4.0 - 10.5 K/uL 16.3  12.5  11.9   Hemoglobin 12.0 - 15.0 g/dL 88.6  9.8  9.6   Hematocrit 36.0 - 46.0 % 34.9  29.5  29.2   Platelets 150 - 400 K/uL 146  123  122     Lab Results  Component Value Date   NA 137 04/09/2024   K 5.7 (H) 04/09/2024   CL 107 04/09/2024   CO2 15 (L) 04/09/2024     Assessment/Plan: Left rectus muscle hematoma/Left thigh hematoma with acute blood loss anemia Unclear etiology-denies obvious trauma-not on anticoagulation S/p 2 units of PRBC on 7/1-Hb stable since then  Acute bilateral PE with left lower extremity DVT Not a candidate for anticoagulation S/p IVC filter placement by IR on 7/01 Probably provoked VTE in the setting left thigh hematoma Once assured that she is no longer bleeding-hematoma not expanding-Hb stable-suspect can be started on Eliquis  AKI on CKD stage IIIb AKI initially felt to be hemodynamically mediated-had improved-but on 7/3-significant worsening Unclear why she would have worsening renal function overnight-no nausea, vomiting or diarrhea Restart IV fluids-given the fact she has developed mild hyperkalemia and metabolic acidosis-will use IV fluids with bicarb Foley catheter was removed earlier this morning (before lab results came back)-will watch closely-frequent bladder scans-if any signs of urinary retention-will need reinsertion Check renal ultrasound- check UA-check CK Watch closely-avoid nephrotoxic agents-if renal function continues to worsen-will need nephrology evaluation  Hyperkalemia Secondary to  worsening renal function Lokelma ordered Starting IV fluid with bicarb Repeat electrolytes tomorrow  Nausea/vomiting Resolved No apparent cause noted on CT imaging Supportive care with  antiemetics  PAD Per vascular surgery-has patent vasculature bilaterally No intervention recommended Unable to use aspirin Follow-up with vascular surgery in the outpatient setting  HTN BP stable-all antihypertensives on hold-was hypotensive on initial presentation.  History of peripheral neuropathy/severe arthritis Chronic debility/deconditioning Supportive care PT/OT eval when able  Code status:   Code Status: Limited: Do not attempt resuscitation (DNR) -DNR-LIMITED -Do Not Intubate/DNI    DVT Prophylaxis: SCDs if possible-anticoagulation contraindicated due to left rectus muscle/left thigh hematoma.   Family Communication: Niece-Brenda [phone] updated over the phone 7/3   Disposition Plan: Status is: Inpatient Remains inpatient appropriate because: Severity of illness   Planned Discharge Destination:Skilled nursing facility   Diet: Diet Order             Diet renal with fluid restriction Fluid restriction: 1200 mL Fluid; Room service appropriate? Yes; Fluid consistency: Thin  Diet effective now                     Antimicrobial agents: Anti-infectives (From admission, onward)    None        MEDICATIONS: Scheduled Meds:  Chlorhexidine Gluconate Cloth  6 each Topical Daily   pantoprazole   40 mg Oral Daily   sodium chloride  flush  3 mL Intravenous Q12H   Continuous Infusions: PRN Meds:.acetaminophen  **OR** acetaminophen , albuterol, colchicine, dextrose, LORazepam , ondansetron  (ZOFRAN ) IV, prochlorperazine, traMADol    I have personally reviewed following labs and imaging studies  LABORATORY DATA: CBC: Recent Labs  Lab 04/06/24 0428 04/06/24 0433 04/06/24 2014 04/07/24 0551 04/07/24 1630 04/08/24 0545 04/09/24 0552  WBC 11.2*   < > 14.5* 12.8* 11.9* 12.5* 16.3*  NEUTROABS 6.0  --   --   --   --   --   --   HGB 10.0*   < > 7.6* 6.8* 9.6* 9.8* 11.3*  HCT 31.4*   < > 23.6* 21.3* 29.2* 29.5* 34.9*  MCV 95.4   < > 94.8 96.4 92.4  89.4 92.8  PLT 212   < > 168 138* 122* 123* 146*   < > = values in this interval not displayed.    Basic Metabolic Panel: Recent Labs  Lab 04/06/24 0428 04/06/24 0433 04/07/24 0551 04/07/24 1630 04/08/24 0545 04/09/24 0846  NA 144 140 139 139 141 137  K 3.6 4.8 6.0* 4.8 4.3 5.7*  CL 121* 111 112* 110 108 107  CO2 15*  --  21* 17* 20* 15*  GLUCOSE 155* 196* 102* 84 96 109*  BUN 23 31* 37* 32* 26* 48*  CREATININE 1.25* 1.80* 2.17* 2.00* 1.93* 3.44*  CALCIUM 6.4*  --  7.9* 8.1* 8.2* 8.5*    GFR: Estimated Creatinine Clearance: 10.8 mL/min (A) (by C-G formula based on SCr of 3.44 mg/dL (H)).  Liver Function Tests: No results for input(s): AST, ALT, ALKPHOS, BILITOT, PROT, ALBUMIN in the last 168 hours. Recent Labs  Lab 04/06/24 0910  LIPASE 41   No results for input(s): AMMONIA in the last 168 hours.  Coagulation Profile: Recent Labs  Lab 04/06/24 0428 04/06/24 0614  INR 1.1 1.5*    Cardiac Enzymes: Recent Labs  Lab 04/06/24 0428  CKTOTAL 54    BNP (last 3 results) No results for input(s): PROBNP in the last 8760 hours.  Lipid Profile: No results for input(s): CHOL, HDL, LDLCALC, TRIG, CHOLHDL, LDLDIRECT in the  last 72 hours.  Thyroid  Function Tests: No results for input(s): TSH, T4TOTAL, FREET4, T3FREE, THYROIDAB in the last 72 hours.  Anemia Panel: No results for input(s): VITAMINB12, FOLATE, FERRITIN, TIBC, IRON, RETICCTPCT in the last 72 hours.  Urine analysis:    Component Value Date/Time   COLORURINE STRAW (A) 02/04/2022 1226   APPEARANCEUR CLEAR 02/04/2022 1226   LABSPEC 1.006 02/04/2022 1226   PHURINE 5.0 02/04/2022 1226   GLUCOSEU NEGATIVE 02/04/2022 1226   HGBUR NEGATIVE 02/04/2022 1226   BILIRUBINUR NEGATIVE 02/04/2022 1226   KETONESUR NEGATIVE 02/04/2022 1226   PROTEINUR NEGATIVE 02/04/2022 1226   UROBILINOGEN 0.2 09/11/2011 0927   NITRITE NEGATIVE 02/04/2022 1226   LEUKOCYTESUR  NEGATIVE 02/04/2022 1226    Sepsis Labs: Lactic Acid, Venous    Component Value Date/Time   LATICACIDVEN 3.3 (HH) 04/06/2024 2014    MICROBIOLOGY: No results found for this or any previous visit (from the past 240 hours).  RADIOLOGY STUDIES/RESULTS: VAS US  ABI WITH/WO TBI Result Date: 04/08/2024  LOWER EXTREMITY DOPPLER STUDY Patient Name:  JULYSSA KYER  Date of Exam:   04/08/2024 Medical Rec #: [id number]      Accession #:    [id number] Date of Birth: 01/16/1935      Patient Gender: F Patient Age:   55 years Exam Location:  [facility] Procedure:      VAS US  ABI WITH/WO TBI Referring Phys: Norman Serve --------------------------------------------------------------------------------  Indications: Claudication. Severe pain in left leg High Risk Factors: Hypertension. Other Factors: Left leg DVT. IVC filter in place.   Comparison Study: CTA AO-Bifem 04/06/24.                   No prior ABI Performing Technologist: Ricka Sturdivant-Jones RDMS, RVT  Examination Guidelines: A complete evaluation includes at minimum, Doppler waveform signals and systolic blood pressure reading at the level of bilateral brachial, anterior tibial, and posterior tibial arteries, when vessel segments are accessible. Bilateral testing is considered an integral part of a complete examination. Photoelectric Plethysmograph (PPG) waveforms and toe systolic pressure readings are included as required and additional duplex testing as needed. Limited examinations for reoccurring indications may be performed as noted.  ABI Findings: +---------+------------------+-----+---------+--------+ Right    Rt Pressure (mmHg)IndexWaveform Comment  +---------+------------------+-----+---------+--------+ Brachial                        triphasicNA       +---------+------------------+-----+---------+--------+ PTA      185               1.43 triphasic         +---------+------------------+-----+---------+--------+ DP       254                1.97 triphasic         +---------+------------------+-----+---------+--------+ [place]               1.07 Normal            +---------+------------------+-----+---------+--------+ +---------+------------------+-----+---------+-------+ Left     Lt Pressure (mmHg)IndexWaveform Comment +---------+------------------+-----+---------+-------+ Brachial 129                    triphasic        +---------+------------------+-----+---------+-------+ PTA      254               1.97 triphasic        +---------+------------------+-----+---------+-------+ DP       254  1.97 triphasic        +---------+------------------+-----+---------+-------+ Great Toe116               0.90 Normal           +---------+------------------+-----+---------+-------+ +-------+-----------+-----------+------------+------------+ ABI/TBIToday's ABIToday's TBIPrevious ABIPrevious TBI +-------+-----------+-----------+------------+------------+ Right  [place] / 1.07                                      +-------+-----------+-----------+------------+------------+ Left   [place] / 0.90                                      +-------+-----------+-----------+------------+------------+  Summary: Right: Resting right ankle-brachial index indicates noncompressible right lower extremity arteries. The right toe-brachial index is normal. Left: Resting left ankle-brachial index indicates noncompressible left lower extremity arteries. The left toe-brachial index is normal. *See table(s) above for measurements and observations.  Electronically signed by Gaile New MD on 04/08/2024 at 1:43:39 PM.    Final    IR IVC FILTER PLMT / S&I PORTER GUID/MOD SED Result Date: 04/07/2024 INDICATION: Lower extremity DVT, PE, multiple intramuscular hemorrhages in a patient with an inability to anticoagulate EXAM: IVC filter placement MEDICATIONS: None. ANESTHESIA/SEDATION: Moderate (conscious) sedation was  employed during this procedure. A total of Versed 0 mg and Fentanyl 0 mcg was administered intravenously by the radiology nurse. Total intra-service moderate Sedation Time: 0 minutes. The patient's level of consciousness and vital signs were monitored continuously by radiology nursing throughout the procedure under my direct supervision. FLUOROSCOPY: Radiation Exposure Index (as provided by the fluoroscopic device): 49.4 mGy Kerma COMPLICATIONS: None immediate. PROCEDURE: Informed written consent was obtained from the patient after a thorough discussion of the procedural risks, benefits and alternatives. All questions were addressed. Maximal Sterile Barrier Technique was utilized including caps, mask, sterile gowns, sterile gloves, sterile drape, hand hygiene and skin antiseptic. A timeout was performed prior to the initiation of the procedure. In a supine position, the right neck was prepped and draped in usual sterile fashion. Local anesthesia was achieved by infiltrating subcutaneous tissue overlying the right internal jugular vein with 1% lidocaine . A small incision was created overlying the right internal jugular vein. Ultrasound demonstrated the right internal jugular vein to be anechoic and compressible the [place] AT. The needle was advanced through the incision to the midpoint of the right internal jugular vein lumen. Final image was obtained of needle position after the needle had been advanced under ultrasound guidance. Image placed in patient's permanent medical record. Access was exchanged for a 035 guidewire. Under fluoroscopic guidance guidewire was advanced to the IVC. IVC deployment sheath was then advanced over the guidewire and placed in the distal IVC. Guidewire and introducer were then removed. Using the sheath, contrast was injected for an IVC venogram. Renal veins identified. The [facility] filter was prepped the table advanced through the sheath as the sheath was withdrawn there by deploying the  [facility] filter. Minimal tilting present however overall positioning satisfactory. The sheath was then retrieved and hemostasis was achieved. IMPRESSION: Satisfactory placement of a [place] IVC filter. The patient will be followed up in approximately 3 months for determination of retrieval. PLAN: This IVC filter is potentially retrievable. The patient will be assessed for filter retrieval by Interventional Radiology in approximately 8-12 weeks. Further recommendations regarding filter retrieval, continued surveillance or declaration of device permanence, will be  made at that time. Electronically Signed   By: Cordella Banner   On: 04/07/2024 17:35     LOS: 3 days   Donalda Applebaum, MD  [facility]    To contact the attending provider between 7A-7P or the covering provider during after hours 7P-7A, please log into the web site [email or web address] and access using universal [place] password for that web site. If you do not have the password, please call the hospital operator.  04/09/2024, 10:49 AM

## 2024-04-09 NOTE — Care Management Important Message (Signed)
 Important Message  Patient Details  Name: Julie [name] MRN: [id number] Date of Birth: 1934-11-20   Important Message Given:  Yes - Medicare IM     Claretta Deed 04/09/2024, 2:18 PM

## 2024-04-09 NOTE — Progress Notes (Signed)
   04/09/24 1035  Mobility  Activity Turned to right side;Turned to left side;Turned to back - supine (x3)  Level of Assistance Maximum assist, patient does 25-49%  Assistive Device Other (Comment) (bedrails)  Activity Response Tolerated fair  Mobility Referral Yes  Mobility visit 1 Mobility  Mobility Specialist Start Time (ACUTE ONLY) 1035  Mobility Specialist Stop Time (ACUTE ONLY) 1109  Mobility Specialist Time Calculation (min) (ACUTE ONLY) 34 min   Mobility Specialist: Progress Note  Post-Mobility:    HR 110  Pt agreeable to mobility session- assisted RN with bath - received in bed. C/o  hot flashes and vertigo sx. Pt denied further mobility.  Returned to supine In bed with all needs met - call bell within reach. RN present.   Virgle Boards, BS Mobility Specialist Please contact via SecureChat or  Rehab office at [phone].

## 2024-04-09 NOTE — TOC Progression Note (Addendum)
 Transition of Care [facility]) - Progression Note    Patient Details  Name: AUBRIEGH MINCH MRN: [id number] Date of Birth: [birth year]  [facility] CM/SW Contact  Inocente GORMAN Kindle, LCSW Phone Number: 04/09/2024, 1:47 PM  Clinical Narrative:    12:15pm-CSW met with patient and provided SNF bed offers and ratings. She requested CSW contact Vivian to discuss with her as well. CSW left voicemail for [place].   3:58 PM-CSW spoke with patient's niece, Erminio, and securely emailed SNF bed offers to [email or web address].    Expected Discharge Plan: Skilled Nursing Facility Barriers to Discharge: English as a second language teacher, Continued Medical Work up, SNF Pending bed offer  Expected Discharge Plan and Services In-house Referral: Clinical Social Work   Post Acute Care Choice: Skilled Nursing Facility Living arrangements for the past 2 months: Apartment                                       Social Determinants of Health (SDOH) Interventions SDOH Screenings   Food Insecurity: No Food Insecurity (04/07/2024)  Housing: Low Risk  (04/07/2024)  Transportation Needs: No Transportation Needs (04/07/2024)  Utilities: Not At Risk (04/07/2024)  Social Connections: Moderately Isolated (04/07/2024)  Tobacco Use: Medium Risk (04/06/2024)    Readmission Risk Interventions     No data to display

## 2024-04-10 DIAGNOSIS — I2699 Other pulmonary embolism without acute cor pulmonale: Secondary | ICD-10-CM | POA: Diagnosis not present

## 2024-04-10 DIAGNOSIS — S7012XA Contusion of left thigh, initial encounter: Secondary | ICD-10-CM | POA: Diagnosis not present

## 2024-04-10 DIAGNOSIS — N1832 Chronic kidney disease, stage 3b: Secondary | ICD-10-CM | POA: Diagnosis not present

## 2024-04-10 DIAGNOSIS — M7981 Nontraumatic hematoma of soft tissue: Secondary | ICD-10-CM | POA: Diagnosis not present

## 2024-04-10 LAB — URINALYSIS, COMPLETE (UACMP) WITH MICROSCOPIC
Bilirubin Urine: NEGATIVE
Glucose, UA: NEGATIVE mg/dL
Ketones, ur: NEGATIVE mg/dL
Nitrite: NEGATIVE
Protein, ur: NEGATIVE mg/dL
Specific Gravity, Urine: 1.024 (ref 1.005–1.030)
pH: 5 (ref 5.0–8.0)

## 2024-04-10 LAB — CBC
HCT: 32.7 % — ABNORMAL LOW (ref 36.0–46.0)
HCT: 29.4 % — ABNORMAL LOW (ref 36.0–46.0)
Hemoglobin: 10.8 g/dL — ABNORMAL LOW (ref 12.0–15.0)
Hemoglobin: 9.8 g/dL — ABNORMAL LOW (ref 12.0–15.0)
MCH: 30.2 pg (ref 26.0–34.0)
MCH: 30.4 pg (ref 26.0–34.0)
MCHC: 33 g/dL (ref 30.0–36.0)
MCHC: 33.3 g/dL (ref 30.0–36.0)
MCV: 91.3 fL (ref 80.0–100.0)
MCV: 91.3 fL (ref 80.0–100.0)
Platelets: 128 K/uL — ABNORMAL LOW (ref 150–400)
Platelets: 139 K/uL — ABNORMAL LOW (ref 150–400)
RBC: 3.58 MIL/uL — ABNORMAL LOW (ref 3.87–5.11)
RBC: 3.22 MIL/uL — ABNORMAL LOW (ref 3.87–5.11)
RDW: 15.2 % (ref 11.5–15.5)
RDW: 15.2 % (ref 11.5–15.5)
WBC: 13.8 K/uL — ABNORMAL HIGH (ref 4.0–10.5)
WBC: 11.6 K/uL — ABNORMAL HIGH (ref 4.0–10.5)
nRBC: 0.2 % (ref 0.0–0.2)
nRBC: 0 % (ref 0.0–0.2)

## 2024-04-10 LAB — BASIC METABOLIC PANEL WITH GFR
Anion gap: 16 — ABNORMAL HIGH (ref 5–15)
BUN: 56 mg/dL — ABNORMAL HIGH (ref 8–23)
CO2: 20 mmol/L — ABNORMAL LOW (ref 22–32)
Calcium: 7.6 mg/dL — ABNORMAL LOW (ref 8.9–10.3)
Chloride: 100 mmol/L (ref 98–111)
Creatinine, Ser: 3.56 mg/dL — ABNORMAL HIGH (ref 0.44–1.00)
GFR, Estimated: 12 mL/min — ABNORMAL LOW (ref >60)
Glucose, Bld: 133 mg/dL — ABNORMAL HIGH (ref 70–99)
Potassium: 4.6 mmol/L (ref 3.5–5.1)
Sodium: 136 mmol/L (ref 135–145)

## 2024-04-10 LAB — GLUCOSE, CAPILLARY
Glucose-Capillary: 134 mg/dL — ABNORMAL HIGH (ref 70–99)
Glucose-Capillary: 149 mg/dL — ABNORMAL HIGH (ref 70–99)
Glucose-Capillary: 117 mg/dL — ABNORMAL HIGH (ref 70–99)
Glucose-Capillary: 193 mg/dL — ABNORMAL HIGH (ref 70–99)

## 2024-04-10 MED ORDER — LACTATED RINGERS IV BOLUS
1000.0000 mL | Freq: Once | INTRAVENOUS | Status: AC
  Administered 2024-04-10: 1000 mL via INTRAVENOUS

## 2024-04-10 MED ORDER — SENNOSIDES-DOCUSATE SODIUM 8.6-50 MG PO TABS
2.0000 | ORAL_TABLET | Freq: Every day | ORAL | Status: DC
  Administered 2024-04-10 – 2024-04-12 (×2): 2 via ORAL
  Filled 2024-04-10 (×2): qty 2

## 2024-04-10 MED ORDER — BISACODYL 10 MG RE SUPP: 10.0000 mg | Freq: Every day | RECTAL | Status: DC | PRN

## 2024-04-10 MED ORDER — MIDODRINE HCL 5 MG PO TABS: 5.0000 mg | ORAL_TABLET | Freq: Three times a day (TID) | ORAL | Status: DC

## 2024-04-10 MED ORDER — MIDODRINE HCL 5 MG PO TABS
5.0000 mg | ORAL_TABLET | Freq: Three times a day (TID) | ORAL | Status: DC
  Administered 2024-04-10 (×2): 5 mg via ORAL
  Filled 2024-04-10 (×2): qty 1

## 2024-04-10 MED ORDER — SODIUM BICARBONATE 8.4 % IV SOLN
INTRAVENOUS | Status: DC
  Filled 2024-04-10: qty 1000

## 2024-04-10 MED ORDER — POLYETHYLENE GLYCOL 3350 17 G PO PACK
17.0000 g | PACK | Freq: Every day | ORAL | Status: DC
  Administered 2024-04-10: 17 g via ORAL
  Filled 2024-04-10 (×2): qty 1

## 2024-04-10 MED ORDER — SODIUM CHLORIDE 0.9 % IV BOLUS
1000.0000 mL | Freq: Once | INTRAVENOUS | Status: AC
  Administered 2024-04-10: 1000 mL via INTRAVENOUS

## 2024-04-10 NOTE — Progress Notes (Addendum)
 PROGRESS NOTE        PATIENT DETAILS Name: Julie [name] Age: 89 y.o. Sex: female Date of Birth: June 09, 1935 Admit Date: 04/06/2024 Admitting Physician Maximino DELENA Sharps, MD ERE:Rnoopwd, Lonell, DO  Brief Summary: Patient is a 89 y.o.  female with history of HTN, hypothyroidism, GERD, advanced arthritis, chronic neuropathy-near bedbound status-who presented with sudden onset of left leg pain that woke her up around 2 am on the day of her presentation-she was evaluated in the ED-due to concern for potential ischemic LLE-she underwent CTA study-which showed rectus sheath/left thigh hematoma as well as pulmonary emboli.  She was briefly hypotensive in the ED but responded to IVF bolus. She was evaluated by PCCM-not felt to require intervention for PE-also evaluated by vascular surgery-thought to have patent vasculature bilaterally.  She was subsequently admitted to the hospitalist service.  Significant events: 6/30>> presented with sudden onset of left leg pain-found to have left rectus muscle/left thigh hematoma and PE. 7/01>> IVC filter placement 7/23>> worsening renal function-CK not elevated-renal ultrasound but is on exam most without hydronephrosis.  IV fluid started.  Significant studies: 6/30>> CTA abdominal aorta with iliofemoral runoff: Partially visible pulmonary emboli, moderate size left rectus muscle hematoma, additional long segment left thigh intramuscular hematoma, advanced femoral/bilateral lower extremity calcified atherosclerosis. 6/30>> TTE: EF> 75% 7/01>> B/L LE Doppler: Acute DVT left femoral/left popliteal/left tibial/left peroneal vein 7/03>> renal ultrasound: No hydronephrosis but numerous renal cysts.  Significant microbiology data: None  Procedures: 7/01>> IVC filter placement  Consults: Vascular surgery PCCM IR  Subjective: Has poor appetite-only urinated once last night.  Objective: Vitals: Blood pressure (!) 101/37, pulse 89,  temperature 98 F (36.7 C), temperature source Oral, resp. rate 11, height 5' 4 (1.626 m), weight 66 kg, SpO2 98%.   Exam: Awake/alert Chest: Clear to auscultation CVS: S1-S2 regular Abdomen: Soft nontender nondistended Extremity: Continues to have significant left thigh swelling.  Pertinent Labs/Radiology:    Latest Ref Rng & Units 04/10/2024    4:55 AM 04/09/2024    5:52 AM 04/08/2024    5:45 AM  CBC  WBC 4.0 - 10.5 K/uL 13.8  16.3  12.5   Hemoglobin 12.0 - 15.0 g/dL 89.1  88.6  9.8   Hematocrit 36.0 - 46.0 % 32.7  34.9  29.5   Platelets 150 - 400 K/uL 128  146  123     Lab Results  Component Value Date   NA 136 04/10/2024   K 4.6 04/10/2024   CL 100 04/10/2024   CO2 20 (L) 04/10/2024     Assessment/Plan: Left rectus muscle hematoma/Left thigh hematoma with acute blood loss anemia Unclear etiology-denies obvious trauma-not on anticoagulation S/p 2 units of PRBC on 7/1-Hb stable since then  Acute bilateral PE with left lower extremity DVT Not a candidate for anticoagulation S/p IVC filter placement by IR on 7/01 Probably provoked VTE in the setting left thigh hematoma Once assured that she is no longer bleeding-hematoma not expanding-Hb stable-suspect can be started on Eliquis  AKI on CKD stage IIIb AKI initially felt to be hemodynamically mediated-had improved-but on 7/3-significant worsening-unclear etiology but potentially could be from contrast-induced nephropathy (CTA study on 6/30-and IVC filter on 7/1).  Has poor oral intake but no overt nausea vomiting or diarrhea. Hardly any urine output yesterday (Foley catheter removed before labs came back on 7/3)-will replace Foley catheter this  morning. Avoid nephrotoxic agents- strict input/output. Follow closely-will discuss-nephrology renal function worsens. Continue IVF with bicarb   Addendum Discussed with nephrology-Dr. Dolan over the phone-agrees that this is likely contrast-induced nephropathy.  Recommends that we  continue with current IVF but bolus 1 L of IV fluids.  Continue care as we are doing-trend creatinine-and discussed with nephrology again on 7/5.  Hyperkalemia Secondary to worsening renal function Resolved with Lokelma  and IVF with bicarb.  Nausea/vomiting Resolved No apparent cause noted on CT imaging Supportive care with antiemetics  PAD Per vascular surgery-has patent vasculature bilaterally No intervention recommended Unable to use aspirin Follow-up with vascular surgery in the outpatient setting  HTN BP stable-all antihypertensives on hold-was hypotensive on initial presentation.  History of peripheral neuropathy/severe arthritis Chronic debility/deconditioning Supportive care PT/OT eval when able  Code status:   Code Status: Limited: Do not attempt resuscitation (DNR) -DNR-LIMITED -Do Not Intubate/DNI    DVT Prophylaxis: SCDs if possible-anticoagulation contraindicated due to left rectus muscle/left thigh hematoma.   Family Communication: Niece-Brenda [phone] updated over the phone 7/3   Disposition Plan: Status is: Inpatient Remains inpatient appropriate because: Severity of illness   Planned Discharge Destination:Skilled nursing facility   Diet: Diet Order             Diet renal with fluid restriction Fluid restriction: 1200 mL Fluid; Room service appropriate? Yes; Fluid consistency: Thin  Diet effective now                     Antimicrobial agents: Anti-infectives (From admission, onward)    None        MEDICATIONS: Scheduled Meds:  Chlorhexidine  Gluconate Cloth  6 each Topical Daily   pantoprazole   40 mg Oral Daily   sodium chloride  flush  3 mL Intravenous Q12H   Continuous Infusions:  sodium bicarbonate  150 mEq in dextrose  5 % 1,150 mL infusion 75 mL/hr at 04/10/24 1029   PRN Meds:.acetaminophen  **OR** acetaminophen , albuterol , dextrose , LORazepam , ondansetron  (ZOFRAN ) IV, prochlorperazine , traMADol    I have personally  reviewed following labs and imaging studies  LABORATORY DATA: CBC: Recent Labs  Lab 04/06/24 0428 04/06/24 0433 04/07/24 0551 04/07/24 1630 04/08/24 0545 04/09/24 0552 04/10/24 0455  WBC 11.2*   < > 12.8* 11.9* 12.5* 16.3* 13.8*  NEUTROABS 6.0  --   --   --   --   --   --   HGB 10.0*   < > 6.8* 9.6* 9.8* 11.3* 10.8*  HCT 31.4*   < > 21.3* 29.2* 29.5* 34.9* 32.7*  MCV 95.4   < > 96.4 92.4 89.4 92.8 91.3  PLT 212   < > 138* 122* 123* 146* 128*   < > = values in this interval not displayed.    Basic Metabolic Panel: Recent Labs  Lab 04/07/24 0551 04/07/24 1630 04/08/24 0545 04/09/24 0846 04/10/24 0455  NA 139 139 141 137 136  K 6.0* 4.8 4.3 5.7* 4.6  CL 112* 110 108 107 100  CO2 21* 17* 20* 15* 20*  GLUCOSE 102* 84 96 109* 133*  BUN 37* 32* 26* 48* 56*  CREATININE 2.17* 2.00* 1.93* 3.44* 3.56*  CALCIUM  7.9* 8.1* 8.2* 8.5* 7.6*    GFR: Estimated Creatinine Clearance: 10.4 mL/min (A) (by C-G formula based on SCr of 3.56 mg/dL (H)).  Liver Function Tests: No results for input(s): AST, ALT, ALKPHOS, BILITOT, PROT, ALBUMIN in the last 168 hours. Recent Labs  Lab 04/06/24 0910  LIPASE 41   No results for input(s): AMMONIA  in the last 168 hours.  Coagulation Profile: Recent Labs  Lab 04/06/24 0428 04/06/24 0614  INR 1.1 1.5*    Cardiac Enzymes: Recent Labs  Lab 04/06/24 0428 04/09/24 1120  CKTOTAL 54 111    BNP (last 3 results) No results for input(s): PROBNP in the last 8760 hours.  Lipid Profile: No results for input(s): CHOL, HDL, LDLCALC, TRIG, CHOLHDL, LDLDIRECT in the last 72 hours.  Thyroid  Function Tests: No results for input(s): TSH, T4TOTAL, FREET4, T3FREE, THYROIDAB in the last 72 hours.  Anemia Panel: No results for input(s): VITAMINB12, FOLATE, FERRITIN, TIBC, IRON, RETICCTPCT in the last 72 hours.  Urine analysis:    Component Value Date/Time   COLORURINE STRAW (A) 02/04/2022 1226    APPEARANCEUR CLEAR 02/04/2022 1226   LABSPEC 1.006 02/04/2022 1226   PHURINE 5.0 02/04/2022 1226   GLUCOSEU NEGATIVE 02/04/2022 1226   HGBUR NEGATIVE 02/04/2022 1226   BILIRUBINUR NEGATIVE 02/04/2022 1226   KETONESUR NEGATIVE 02/04/2022 1226   PROTEINUR NEGATIVE 02/04/2022 1226   UROBILINOGEN 0.2 09/11/2011 0927   NITRITE NEGATIVE 02/04/2022 1226   LEUKOCYTESUR NEGATIVE 02/04/2022 1226    Sepsis Labs: Lactic Acid, Venous    Component Value Date/Time   LATICACIDVEN 3.3 (HH) 04/06/2024 2014    MICROBIOLOGY: No results found for this or any previous visit (from the past 240 hours).  RADIOLOGY STUDIES/RESULTS: US  RENAL Result Date: 04/09/2024 CLINICAL DATA:  Acute kidney injury EXAM: RENAL / URINARY TRACT ULTRASOUND COMPLETE COMPARISON:  CT noncontrast 02/04/2022. CTA 04/06/2024. FINDINGS: Right Kidney: Renal measurements: 11.3 x 5.6 x 9.4 cm = volume: 307.9 mL. No collecting system dilatation. There are numerous anechoic lesions identified in the right kidney consistent with cysts as seen on the prior examination. Largest measures 7.5 x 6.8 x 7.9 cm. Left Kidney: Renal measurements: 9.5 x 6.5 x 6.0 cm = volume: 194.0 mL. No collecting system dilatation. Multiple cystic foci are again identified which appears simple. Largest measures 4.6 cm as seen on the previous examination. Bladder: Appears normal for degree of bladder distention. Other: None. IMPRESSION: Numerous bilateral renal cysts as seen previously. No collecting system dilatation. Electronically Signed   By: Ranell Bring M.D.   On: 04/09/2024 14:19     LOS: 4 days   Donalda Applebaum, MD  [facility]    To contact the attending provider between 7A-7P or the covering provider during after hours 7P-7A, please log into the web site [email or web address] and access using universal [place] password for that web site. If you do not have the password, please call the hospital operator.  04/10/2024, 10:38 AM

## 2024-04-10 NOTE — Progress Notes (Signed)
 Physical Therapy Treatment Patient Details Name: Julie [name] MRN: [id number] DOB: 1935/01/31 Today's Date: 04/10/2024   History of Present Illness 89 y.o. female presents to [facility] 04/06/24 with LLE pain/numbness. Pt with hematoma of L thigh and rectus femoris. Pt also with B acute PE and acute LLE DVT. 7/1 IVC filter. PMHx: hypertension, hypothyroidism, gout, GERD, CKD    PT Comments  Pt with poor tolerance to treatment today. Pt able to sit EOB with mod A for trunk elevation and return to bed for LLE. Pt very limited by pain today. No change in DC/DME recs at this time. PT will continue to follow.      If plan is discharge home, recommend the following: A lot of help with walking and/or transfers;A lot of help with bathing/dressing/bathroom;Assistance with cooking/housework;Assist for transportation;Help with stairs or ramp for entrance   Can travel by private vehicle     No  Equipment Recommendations  None recommended by PT    Recommendations for Other Services       Precautions / Restrictions Precautions Precautions: Fall Recall of Precautions/Restrictions: Intact Precaution/Restrictions Comments: LLE hematoma Restrictions Weight Bearing Restrictions Per Provider Order: No     Mobility  Bed Mobility Overal bed mobility: Needs Assistance Bed Mobility: Supine to Sit, Sit to Supine     Supine to sit: Mod assist Sit to supine: Mod assist   General bed mobility comments: mod A for trunk elevation and return to bed for LLE. Pt very limited by pain today.    Transfers                   General transfer comment: Deferred due to pain.    Ambulation/Gait               General Gait Details: unable this date   Stairs             Wheelchair Mobility     Tilt Bed    Modified Rankin (Stroke Patients Only)       Balance Overall balance assessment: Needs assistance Sitting-balance support: No upper extremity supported, Feet supported Sitting  balance-Leahy Scale: Fair                                      Hotel manager: Impaired Factors Affecting Communication: Hearing impaired  Cognition Arousal: Alert Behavior During Therapy: WFL for tasks assessed/performed   PT - Cognitive impairments: No apparent impairments                         Following commands: Intact      Cueing Cueing Techniques: Verbal cues, Tactile cues  Exercises      General Comments General comments (skin integrity, edema, etc.): VSS on RA      Pertinent Vitals/Pain Pain Assessment Pain Assessment: 0-10 Pain Score: 9  Pain Location: L LE Pain Descriptors / Indicators: Aching, Discomfort Pain Intervention(s): Monitored during session, Limited activity within patient's tolerance    Home Living                          Prior Function            PT Goals (current goals can now be found in the care plan section) Progress towards PT goals: Progressing toward goals    Frequency    Min 2X/week  PT Plan      Co-evaluation              AM-PAC PT 6 Clicks Mobility   Outcome Measure  Help needed turning from your back to your side while in a flat bed without using bedrails?: A Little Help needed moving from lying on your back to sitting on the side of a flat bed without using bedrails?: A Lot Help needed moving to and from a bed to a chair (including a wheelchair)?: A Lot Help needed standing up from a chair using your arms (e.g., wheelchair or bedside chair)?: A Lot Help needed to walk in hospital room?: Total Help needed climbing 3-5 steps with a railing? : Total 6 Click Score: 11    End of Session   Activity Tolerance: Patient limited by pain Patient left: in bed;with call bell/phone within reach;with bed alarm set Nurse Communication: Mobility status PT Visit Diagnosis: Unsteadiness on feet (R26.81);Other abnormalities of gait and mobility  (R26.89);Muscle weakness (generalized) (M62.81);History of falling (Z91.81)     Time: 9069-9056 PT Time Calculation (min) (ACUTE ONLY): 13 min  Charges:    $Therapeutic Activity: 8-22 mins PT General Charges $$ ACUTE PT VISIT: 1 Visit                     Macrae Wiegman B, PT, DPT Acute Rehab Services [phone]    [name] 04/10/2024, 1:27 PM

## 2024-04-10 NOTE — Plan of Care (Signed)

## 2024-04-10 NOTE — TOC Progression Note (Signed)
 Transition of Care [facility]) - Progression Note    Patient Details  Name: Julie [name] MRN: [id number] Date of Birth: [birth year]  [facility] CM/SW Contact  Bridget Cordella Simmonds, LCSW Phone Number: 04/10/2024, 10:09 AM  Clinical Narrative:   CSW spoke with pt regarding SNF choice.  Pt states she would like to accept offer at [facility]  CSW attempted to call niece Erminio as well, left message.      Expected Discharge Plan: Skilled Nursing Facility Barriers to Discharge: English as a second language teacher, Continued Medical Work up, SNF Pending bed offer  Expected Discharge Plan and Services In-house Referral: Clinical Social Work   Post Acute Care Choice: Skilled Nursing Facility Living arrangements for the past 2 months: Apartment                                       Social Determinants of Health (SDOH) Interventions SDOH Screenings   Food Insecurity: No Food Insecurity (04/07/2024)  Housing: Low Risk  (04/07/2024)  Transportation Needs: No Transportation Needs (04/07/2024)  Utilities: Not At Risk (04/07/2024)  Social Connections: Moderately Isolated (04/07/2024)  Tobacco Use: Medium Risk (04/06/2024)    Readmission Risk Interventions     No data to display

## 2024-04-10 NOTE — Plan of Care (Signed)

## 2024-04-11 DIAGNOSIS — M7981 Nontraumatic hematoma of soft tissue: Secondary | ICD-10-CM | POA: Diagnosis not present

## 2024-04-11 DIAGNOSIS — N1832 Chronic kidney disease, stage 3b: Secondary | ICD-10-CM | POA: Diagnosis not present

## 2024-04-11 DIAGNOSIS — I2699 Other pulmonary embolism without acute cor pulmonale: Secondary | ICD-10-CM | POA: Diagnosis not present

## 2024-04-11 DIAGNOSIS — S7012XA Contusion of left thigh, initial encounter: Secondary | ICD-10-CM | POA: Diagnosis not present

## 2024-04-11 LAB — CBC
HCT: 28.8 % — ABNORMAL LOW (ref 36.0–46.0)
Hemoglobin: 9.6 g/dL — ABNORMAL LOW (ref 12.0–15.0)
MCH: 30.6 pg (ref 26.0–34.0)
MCHC: 33.3 g/dL (ref 30.0–36.0)
MCV: 91.7 fL (ref 80.0–100.0)
Platelets: 133 K/uL — ABNORMAL LOW (ref 150–400)
RBC: 3.14 MIL/uL — ABNORMAL LOW (ref 3.87–5.11)
RDW: 15.1 % (ref 11.5–15.5)
WBC: 12.2 K/uL — ABNORMAL HIGH (ref 4.0–10.5)
nRBC: 0.2 % (ref 0.0–0.2)

## 2024-04-11 LAB — RENAL FUNCTION PANEL
Albumin: 2.3 g/dL — ABNORMAL LOW (ref 3.5–5.0)
Anion gap: 11 (ref 5–15)
BUN: 56 mg/dL — ABNORMAL HIGH (ref 8–23)
CO2: 27 mmol/L (ref 22–32)
Calcium: 7.4 mg/dL — ABNORMAL LOW (ref 8.9–10.3)
Chloride: 99 mmol/L (ref 98–111)
Creatinine, Ser: 3.4 mg/dL — ABNORMAL HIGH (ref 0.44–1.00)
GFR, Estimated: 13 mL/min — ABNORMAL LOW (ref >60)
Glucose, Bld: 127 mg/dL — ABNORMAL HIGH (ref 70–99)
Phosphorus: 5.5 mg/dL — ABNORMAL HIGH (ref 2.5–4.6)
Potassium: 4.1 mmol/L (ref 3.5–5.1)
Sodium: 137 mmol/L (ref 135–145)

## 2024-04-11 LAB — TYPE AND SCREEN
ABO/RH(D): O POS
Antibody Screen: NEGATIVE

## 2024-04-11 LAB — GLUCOSE, CAPILLARY
Glucose-Capillary: 125 mg/dL — ABNORMAL HIGH (ref 70–99)
Glucose-Capillary: 122 mg/dL — ABNORMAL HIGH (ref 70–99)
Glucose-Capillary: 134 mg/dL — ABNORMAL HIGH (ref 70–99)
Glucose-Capillary: 123 mg/dL — ABNORMAL HIGH (ref 70–99)

## 2024-04-11 MED ORDER — MIDODRINE HCL 5 MG PO TABS
10.0000 mg | ORAL_TABLET | Freq: Three times a day (TID) | ORAL | Status: DC
  Administered 2024-04-11 – 2024-04-12 (×6): 10 mg via ORAL
  Filled 2024-04-11 (×6): qty 2

## 2024-04-11 MED ORDER — LACTATED RINGERS IV SOLN: INTRAVENOUS | Status: AC

## 2024-04-11 NOTE — Plan of Care (Signed)
  Problem: Education: Goal: Ability to describe self-care measures that may prevent or decrease complications (Diabetes Survival Skills Education) will improve Outcome: Progressing Goal: Individualized Educational Video(s) Outcome: Progressing   Problem: Fluid Volume: Goal: Ability to maintain a balanced intake and output will improve Outcome: Progressing   Problem: Metabolic: Goal: Ability to maintain appropriate glucose levels will improve Outcome: Progressing   Problem: Skin Integrity: Goal: Risk for impaired skin integrity will decrease Outcome: Progressing   Problem: Tissue Perfusion: Goal: Adequacy of tissue perfusion will improve Outcome: Progressing   Problem: Activity: Goal: Risk for activity intolerance will decrease Outcome: Progressing   Problem: Coping: Goal: Level of anxiety will decrease Outcome: Progressing   Problem: Pain Managment: Goal: General experience of comfort will improve and/or be controlled Outcome: Progressing   Problem: Safety: Goal: Ability to remain free from injury will improve Outcome: Progressing   Problem: Skin Integrity: Goal: Risk for impaired skin integrity will decrease Outcome: Progressing

## 2024-04-11 NOTE — Progress Notes (Signed)
 PROGRESS NOTE        PATIENT DETAILS Name: Julie [name] Age: 89 y.o. Sex: female Date of Birth: 1934/11/07 Admit Date: 04/06/2024 Admitting Physician Maximino DELENA Sharps, MD ERE:Rnoopwd, Lonell, DO  Brief Summary: Patient is a 89 y.o.  female with history of HTN, hypothyroidism, GERD, advanced arthritis, chronic neuropathy-near bedbound status-who presented with sudden onset of left leg pain that woke her up around 2 am on the day of her presentation-she was evaluated in the ED-due to concern for potential ischemic LLE-she underwent CTA study-which showed rectus sheath/left thigh hematoma as well as pulmonary emboli.  She was briefly hypotensive in the ED but responded to IVF bolus. She was evaluated by PCCM-not felt to require intervention for PE-also evaluated by vascular surgery-thought to have patent vasculature bilaterally.  She was subsequently admitted to the hospitalist service.  Significant events: 6/30>> presented with sudden onset of left leg pain-found to have left rectus muscle/left thigh hematoma and PE. 7/01>> IVC filter placement 7/23>> worsening renal function-CK not elevated-renal ultrasound but is on exam most without hydronephrosis.  IV fluid started.  Significant studies: 6/30>> CTA abdominal aorta with iliofemoral runoff: Partially visible pulmonary emboli, moderate size left rectus muscle hematoma, additional long segment left thigh intramuscular hematoma, advanced femoral/bilateral lower extremity calcified atherosclerosis. 6/30>> TTE: EF> 75% 7/01>> B/L LE Doppler: Acute DVT left femoral/left popliteal/left tibial/left peroneal vein 7/03>> renal ultrasound: No hydronephrosis but numerous renal cysts.  Significant microbiology data: None  Procedures: 7/01>> IVC filter placement  Consults: Vascular surgery PCCM IR  Subjective: Feels little bit better this morning-although claims that when she sits up/stands up-she gets  dizzy.  Objective: Vitals: Blood pressure (!) 88/57, pulse 91, temperature 99.3 F (37.4 C), temperature source Oral, resp. rate 17, height 5' 4 (1.626 m), weight 66 kg, SpO2 95%.   Exam: Awake/alert Not in any distress Chest: Clear to auscultation CVS: S1-S2 regular Abdomen soft nontender nondistended Extremities: Unchanged left thigh swelling.  Pertinent Labs/Radiology:    Latest Ref Rng & Units 04/11/2024    8:54 AM 04/10/2024    9:28 PM 04/10/2024    4:55 AM  CBC  WBC 4.0 - 10.5 K/uL 12.2  11.6  13.8   Hemoglobin 12.0 - 15.0 g/dL 9.6  9.8  89.1   Hematocrit 36.0 - 46.0 % 28.8  29.4  32.7   Platelets 150 - 400 K/uL 133  139  128     Lab Results  Component Value Date   NA 137 04/11/2024   K 4.1 04/11/2024   CL 99 04/11/2024   CO2 27 04/11/2024    Assessment/Plan: Left rectus muscle hematoma/Left thigh hematoma with acute blood loss anemia Unclear etiology-denies obvious trauma-not on anticoagulation S/p 2 units of PRBC on 7/1-Hb stable since then  Acute bilateral PE with left lower extremity DVT Not a candidate for anticoagulation S/p IVC filter placement by IR on 7/01 Probably provoked VTE in the setting left thigh hematoma Once assured that she is no longer bleeding-hematoma not expanding-Hb stable-suspect can be started on Eliquis  AKI on CKD stage IIIb AKI initially felt to be hemodynamically mediated-had improved-but on 7/3-significant worsening-suspicion for contrast-induced nephropathy  (CTA study on 6/30-and IVC filter on 7/1).   Has poor oral intake but no overt nausea vomiting or diarrhea. Creatinine seems to have plateaued and now downtrending Continues to have poor oral intake-will  continue IV fluids for another day-possible switch to Ringer lactate. Continue to monitor renal function closely Strict intake/output Had discussed with nephrology-Dr. Dolan on 7/4-since renal function improving-will hold off on formal consultation at this  point  Hyperkalemia Secondary to worsening renal function Resolved with Lokelma  and IVF with bicarb.  Nausea/vomiting Resolved No apparent cause noted on CT imaging Supportive care with antiemetics  PAD Per vascular surgery-has patent vasculature bilaterally No intervention recommended Unable to use aspirin Follow-up with vascular surgery in the outpatient setting  HTN BP soft-all antihypertensives on hold-on IV fluids Have started midodrine  for BP support-does not appear left thigh hematoma/rectus sheath hematoma is still bleeding-as hemoglobin is stable. Check a.m. cortisol   History of peripheral neuropathy/severe arthritis Chronic debility/deconditioning Supportive care PT/OT eval when able  Code status:   Code Status: Limited: Do not attempt resuscitation (DNR) -DNR-LIMITED -Do Not Intubate/DNI    DVT Prophylaxis: SCDs if possible-anticoagulation contraindicated due to left rectus muscle/left thigh hematoma.   Family Communication: Niece-Brenda [phone] updated over the phone 7/5   Disposition Plan: Status is: Inpatient Remains inpatient appropriate because: Severity of illness   Planned Discharge Destination:Skilled nursing facility   Diet: Diet Order             Diet renal with fluid restriction Fluid restriction: 1200 mL Fluid; Room service appropriate? Yes; Fluid consistency: Thin  Diet effective now                     Antimicrobial agents: Anti-infectives (From admission, onward)    None        MEDICATIONS: Scheduled Meds:  Chlorhexidine  Gluconate Cloth  6 each Topical Daily   midodrine   10 mg Oral TID WC   pantoprazole   40 mg Oral Daily   polyethylene glycol  17 g Oral Daily   senna-docusate  2 tablet Oral QHS   sodium chloride  flush  3 mL Intravenous Q12H   Continuous Infusions:  lactated ringers  75 mL/hr at 04/11/24 0850   PRN Meds:.acetaminophen  **OR** acetaminophen , albuterol , bisacodyl , dextrose , LORazepam ,  ondansetron  (ZOFRAN ) IV, prochlorperazine , traMADol    I have personally reviewed following labs and imaging studies  LABORATORY DATA: CBC: Recent Labs  Lab 04/06/24 0428 04/06/24 0433 04/08/24 0545 04/09/24 0552 04/10/24 0455 04/10/24 2128 04/11/24 0854  WBC 11.2*   < > 12.5* 16.3* 13.8* 11.6* 12.2*  NEUTROABS 6.0  --   --   --   --   --   --   HGB 10.0*   < > 9.8* 11.3* 10.8* 9.8* 9.6*  HCT 31.4*   < > 29.5* 34.9* 32.7* 29.4* 28.8*  MCV 95.4   < > 89.4 92.8 91.3 91.3 91.7  PLT 212   < > 123* 146* 128* 139* 133*   < > = values in this interval not displayed.    Basic Metabolic Panel: Recent Labs  Lab 04/07/24 1630 04/08/24 0545 04/09/24 0846 04/10/24 0455 04/11/24 0429  NA 139 141 137 136 137  K 4.8 4.3 5.7* 4.6 4.1  CL 110 108 107 100 99  CO2 17* 20* 15* 20* 27  GLUCOSE 84 96 109* 133* 127*  BUN 32* 26* 48* 56* 56*  CREATININE 2.00* 1.93* 3.44* 3.56* 3.40*  CALCIUM  8.1* 8.2* 8.5* 7.6* 7.4*  PHOS  --   --   --   --  5.5*    GFR: Estimated Creatinine Clearance: 10.9 mL/min (A) (by C-G formula based on SCr of 3.4 mg/dL (H)).  Liver Function Tests: Recent Labs  Lab 04/11/24 0429  ALBUMIN 2.3*   Recent Labs  Lab 04/06/24 0910  LIPASE 41   No results for input(s): AMMONIA in the last 168 hours.  Coagulation Profile: Recent Labs  Lab 04/06/24 0428 04/06/24 0614  INR 1.1 1.5*    Cardiac Enzymes: Recent Labs  Lab 04/06/24 0428 04/09/24 1120  CKTOTAL 54 111    BNP (last 3 results) No results for input(s): PROBNP in the last 8760 hours.  Lipid Profile: No results for input(s): CHOL, HDL, LDLCALC, TRIG, CHOLHDL, LDLDIRECT in the last 72 hours.  Thyroid  Function Tests: No results for input(s): TSH, T4TOTAL, FREET4, T3FREE, THYROIDAB in the last 72 hours.  Anemia Panel: No results for input(s): VITAMINB12, FOLATE, FERRITIN, TIBC, IRON, RETICCTPCT in the last 72 hours.  Urine analysis:    Component Value  Date/Time   COLORURINE AMBER (A) 04/10/2024 1219   APPEARANCEUR CLOUDY (A) 04/10/2024 1219   LABSPEC 1.024 04/10/2024 1219   PHURINE 5.0 04/10/2024 1219   GLUCOSEU NEGATIVE 04/10/2024 1219   HGBUR SMALL (A) 04/10/2024 1219   BILIRUBINUR NEGATIVE 04/10/2024 1219   KETONESUR NEGATIVE 04/10/2024 1219   PROTEINUR NEGATIVE 04/10/2024 1219   UROBILINOGEN 0.2 09/11/2011 0927   NITRITE NEGATIVE 04/10/2024 1219   LEUKOCYTESUR SMALL (A) 04/10/2024 1219    Sepsis Labs: Lactic Acid, Venous    Component Value Date/Time   LATICACIDVEN 3.3 (HH) 04/06/2024 2014    MICROBIOLOGY: No results found for this or any previous visit (from the past 240 hours).  RADIOLOGY STUDIES/RESULTS: US  RENAL Result Date: 04/09/2024 CLINICAL DATA:  Acute kidney injury EXAM: RENAL / URINARY TRACT ULTRASOUND COMPLETE COMPARISON:  CT noncontrast 02/04/2022. CTA 04/06/2024. FINDINGS: Right Kidney: Renal measurements: 11.3 x 5.6 x 9.4 cm = volume: 307.9 mL. No collecting system dilatation. There are numerous anechoic lesions identified in the right kidney consistent with cysts as seen on the prior examination. Largest measures 7.5 x 6.8 x 7.9 cm. Left Kidney: Renal measurements: 9.5 x 6.5 x 6.0 cm = volume: 194.0 mL. No collecting system dilatation. Multiple cystic foci are again identified which appears simple. Largest measures 4.6 cm as seen on the previous examination. Bladder: Appears normal for degree of bladder distention. Other: None. IMPRESSION: Numerous bilateral renal cysts as seen previously. No collecting system dilatation. Electronically Signed   By: Ranell Bring M.D.   On: 04/09/2024 14:19     LOS: 5 days   Donalda Applebaum, MD  [facility]    To contact the attending provider between 7A-7P or the covering provider during after hours 7P-7A, please log into the web site [email or web address] and access using universal [place] password for that web site. If you do not have the password, please call the  hospital operator.  04/11/2024, 10:23 AM

## 2024-04-11 NOTE — Plan of Care (Signed)
  Problem: Health Behavior/Discharge Planning: Goal: Ability to manage health-related needs will improve Outcome: Progressing   Problem: Metabolic: Goal: Ability to maintain appropriate glucose levels will improve Outcome: Progressing   Problem: Skin Integrity: Goal: Risk for impaired skin integrity will decrease Outcome: Progressing   Problem: Education: Goal: Knowledge of General Education information will improve Description: Including pain rating scale, medication(s)/side effects and non-pharmacologic comfort measures Outcome: Progressing   Problem: Health Behavior/Discharge Planning: Goal: Ability to manage health-related needs will improve Outcome: Progressing   Problem: Clinical Measurements: Goal: Ability to maintain clinical measurements within normal limits will improve Outcome: Progressing Goal: Will remain free from infection Outcome: Progressing Goal: Diagnostic test results will improve Outcome: Progressing

## 2024-04-12 DIAGNOSIS — I2699 Other pulmonary embolism without acute cor pulmonale: Secondary | ICD-10-CM | POA: Diagnosis not present

## 2024-04-12 DIAGNOSIS — N1832 Chronic kidney disease, stage 3b: Secondary | ICD-10-CM | POA: Diagnosis not present

## 2024-04-12 DIAGNOSIS — S7012XA Contusion of left thigh, initial encounter: Secondary | ICD-10-CM | POA: Diagnosis not present

## 2024-04-12 DIAGNOSIS — M7981 Nontraumatic hematoma of soft tissue: Secondary | ICD-10-CM | POA: Diagnosis not present

## 2024-04-12 LAB — BASIC METABOLIC PANEL WITH GFR
Anion gap: 11 (ref 5–15)
BUN: 57 mg/dL — ABNORMAL HIGH (ref 8–23)
CO2: 27 mmol/L (ref 22–32)
Calcium: 7.4 mg/dL — ABNORMAL LOW (ref 8.9–10.3)
Chloride: 97 mmol/L — ABNORMAL LOW (ref 98–111)
Creatinine, Ser: 3.08 mg/dL — ABNORMAL HIGH (ref 0.44–1.00)
GFR, Estimated: 14 mL/min — ABNORMAL LOW (ref >60)
Glucose, Bld: 103 mg/dL — ABNORMAL HIGH (ref 70–99)
Potassium: 3.8 mmol/L (ref 3.5–5.1)
Sodium: 135 mmol/L (ref 135–145)

## 2024-04-12 LAB — GLUCOSE, CAPILLARY
Glucose-Capillary: 96 mg/dL (ref 70–99)
Glucose-Capillary: 118 mg/dL — ABNORMAL HIGH (ref 70–99)
Glucose-Capillary: 118 mg/dL — ABNORMAL HIGH (ref 70–99)
Glucose-Capillary: 116 mg/dL — ABNORMAL HIGH (ref 70–99)

## 2024-04-12 MED ORDER — LACTULOSE 10 GM/15ML PO SOLN
30.0000 g | Freq: Two times a day (BID) | ORAL | Status: DC
  Administered 2024-04-12: 30 g via ORAL
  Filled 2024-04-12: qty 60

## 2024-04-12 MED ORDER — POLYETHYLENE GLYCOL 3350 17 G PO PACK
17.0000 g | PACK | Freq: Two times a day (BID) | ORAL | Status: DC
  Administered 2024-04-12 (×2): 17 g via ORAL
  Filled 2024-04-12 (×2): qty 1

## 2024-04-12 MED ORDER — BISACODYL 10 MG RE SUPP
10.0000 mg | Freq: Once | RECTAL | Status: AC
  Administered 2024-04-12: 10 mg via RECTAL
  Filled 2024-04-12: qty 1

## 2024-04-12 MED ORDER — SODIUM CHLORIDE 0.9 % IV SOLN: INTRAVENOUS | Status: DC

## 2024-04-12 NOTE — Progress Notes (Addendum)
 PROGRESS NOTE        PATIENT DETAILS Name: Julie [name] Age: 89 y.o. Sex: female Date of Birth: Aug 02, 1935 Admit Date: 04/06/2024 Admitting Physician Maximino DELENA Sharps, MD ERE:Rnoopwd, Lonell, DO  Brief Summary: Patient is a 89 y.o.  female with history of HTN, hypothyroidism, GERD, advanced arthritis, chronic neuropathy-near bedbound status-who presented with sudden onset of left leg pain that woke her up around 2 am on the day of her presentation-she was evaluated in the ED-due to concern for potential ischemic LLE-she underwent CTA study-which showed rectus sheath/left thigh hematoma as well as pulmonary emboli.  She was briefly hypotensive in the ED but responded to IVF bolus. She was evaluated by PCCM-not felt to require intervention for PE-also evaluated by vascular surgery-thought to have patent vasculature bilaterally.  She was subsequently admitted to the hospitalist service.  Significant events: 6/30>> presented with sudden onset of left leg pain-found to have left rectus muscle/left thigh hematoma and PE. 7/01>> IVC filter placement 7/23>> worsening renal function-CK not elevated-renal ultrasound but is on exam most without hydronephrosis.  IV fluid started.  Significant studies: 6/30>> CTA abdominal aorta with iliofemoral runoff: Partially visible pulmonary emboli, moderate size left rectus muscle hematoma, additional long segment left thigh intramuscular hematoma, advanced femoral/bilateral lower extremity calcified atherosclerosis. 6/30>> TTE: EF> 75% 7/01>> B/L LE Doppler: Acute DVT left femoral/left popliteal/left tibial/left peroneal vein 7/03>> renal ultrasound: No hydronephrosis but numerous renal cysts.  Significant microbiology data: None  Procedures: 7/01>> IVC filter placement  Consults: Vascular surgery PCCM IR  Subjective: No major issues overnight-still with unchanged left thigh swelling.  Has not had a bowel movement in several  days in spite of being on MiraLAX /senna.  Objective: Vitals: Blood pressure (!) 109/56, pulse 77, temperature 99.4 F (37.4 C), temperature source Oral, resp. rate 12, height 5' 4 (1.626 m), weight 66 kg, SpO2 98%.   Exam: Awake/alert Chest: Clear to auscultation CVS: S1-S2 regular Abdomen: Soft nontender nondistended Extremities: Unchanged left thigh swelling Neurology: Some LLE weakness but this is from pain-otherwise nonfocal.  Pertinent Labs/Radiology:    Latest Ref Rng & Units 04/11/2024    8:54 AM 04/10/2024    9:28 PM 04/10/2024    4:55 AM  CBC  WBC 4.0 - 10.5 K/uL 12.2  11.6  13.8   Hemoglobin 12.0 - 15.0 g/dL 9.6  9.8  89.1   Hematocrit 36.0 - 46.0 % 28.8  29.4  32.7   Platelets 150 - 400 K/uL 133  139  128     Lab Results  Component Value Date   NA 135 04/12/2024   K 3.8 04/12/2024   CL 97 (L) 04/12/2024   CO2 27 04/12/2024    Assessment/Plan: Left rectus muscle hematoma/Left thigh hematoma with acute blood loss anemia Unclear etiology-denies obvious trauma-not on anticoagulation S/p 2 units of PRBC on 7/1-Hb stable since then  Acute bilateral PE with left lower extremity DVT Not a candidate for anticoagulation S/p IVC filter placement by IR on 7/01 Probably provoked VTE in the setting left thigh hematoma Appears stable for the past Hb has now stabilized-almost a week out from initial presentation-will reassess on 7/7-probably can we challenge with IV heparin  for 24 hours before transitioning to DOAC.    AKI on CKD stage IIIb AKI initially felt to be hemodynamically mediated-had improved-but on 7/3-significant worsening-suspicion for contrast-induced nephropathy  (CTA  study on 6/30-and IVC filter on 7/1).   Oral intake is overall poor but slowly improving Creatinine now downtrending Continue Foley catheter for now-urine output is picking up-500 cc overnight Decrease IV fluids slightly Continue to avoid nephrotoxic agents Continue strict intake/output  monitoring Had discussed with nephrology-Dr. Dolan on 7/4-since renal function improving-will hold off on formal consultation at this point  Hyperkalemia Secondary to worsening renal function Resolved with Lokelma  and IVF with bicarb.  Nausea/vomiting Resolved No apparent cause noted on CT imaging Supportive care with antiemetics  Constipation No response to MiraLAX /senna Dulcolax suppository x 1 today.  PAD Per vascular surgery-has patent vasculature bilaterally No intervention recommended Unable to use aspirin Follow-up with vascular surgery in the outpatient setting  HTN BP soft-all antihypertensives on hold-on IV fluids Have started midodrine  for BP support-does not appear left thigh hematoma/rectus sheath hematoma is still bleeding-as hemoglobin is stable. Check a.m. cortisol   History of peripheral neuropathy/severe arthritis Chronic debility/deconditioning Supportive care PT/OT eval when able  Code status:   Code Status: Limited: Do not attempt resuscitation (DNR) -DNR-LIMITED -Do Not Intubate/DNI    DVT Prophylaxis: SCDs if possible-anticoagulation contraindicated due to left rectus muscle/left thigh hematoma.   Family Communication: Niece-Brenda [phone] updated over the phone 7/5   Disposition Plan: Status is: Inpatient Remains inpatient appropriate because: Severity of illness   Planned Discharge Destination:Skilled nursing facility   Diet: Diet Order             Diet renal with fluid restriction Fluid restriction: 1200 mL Fluid; Room service appropriate? Yes; Fluid consistency: Thin  Diet effective now                     Antimicrobial agents: Anti-infectives (From admission, onward)    None        MEDICATIONS: Scheduled Meds:  bisacodyl   10 mg Rectal Once   Chlorhexidine  Gluconate Cloth  6 each Topical Daily   midodrine   10 mg Oral TID WC   pantoprazole   40 mg Oral Daily   polyethylene glycol  17 g Oral BID    senna-docusate  2 tablet Oral QHS   sodium chloride  flush  3 mL Intravenous Q12H   Continuous Infusions:   PRN Meds:.acetaminophen  **OR** acetaminophen , albuterol , bisacodyl , dextrose , LORazepam , ondansetron  (ZOFRAN ) IV, prochlorperazine , traMADol    I have personally reviewed following labs and imaging studies  LABORATORY DATA: CBC: Recent Labs  Lab 04/06/24 0428 04/06/24 0433 04/08/24 0545 04/09/24 0552 04/10/24 0455 04/10/24 2128 04/11/24 0854  WBC 11.2*   < > 12.5* 16.3* 13.8* 11.6* 12.2*  NEUTROABS 6.0  --   --   --   --   --   --   HGB 10.0*   < > 9.8* 11.3* 10.8* 9.8* 9.6*  HCT 31.4*   < > 29.5* 34.9* 32.7* 29.4* 28.8*  MCV 95.4   < > 89.4 92.8 91.3 91.3 91.7  PLT 212   < > 123* 146* 128* 139* 133*   < > = values in this interval not displayed.    Basic Metabolic Panel: Recent Labs  Lab 04/08/24 0545 04/09/24 0846 04/10/24 0455 04/11/24 0429 04/12/24 0424  NA 141 137 136 137 135  K 4.3 5.7* 4.6 4.1 3.8  CL 108 107 100 99 97*  CO2 20* 15* 20* 27 27  GLUCOSE 96 109* 133* 127* 103*  BUN 26* 48* 56* 56* 57*  CREATININE 1.93* 3.44* 3.56* 3.40* 3.08*  CALCIUM  8.2* 8.5* 7.6* 7.4* 7.4*  PHOS  --   --   --  5.5*  --     GFR: Estimated Creatinine Clearance: 12 mL/min (A) (by C-G formula based on SCr of 3.08 mg/dL (H)).  Liver Function Tests: Recent Labs  Lab 04/11/24 0429  ALBUMIN 2.3*   Recent Labs  Lab 04/06/24 0910  LIPASE 41   No results for input(s): AMMONIA in the last 168 hours.  Coagulation Profile: Recent Labs  Lab 04/06/24 0428 04/06/24 0614  INR 1.1 1.5*    Cardiac Enzymes: Recent Labs  Lab 04/06/24 0428 04/09/24 1120  CKTOTAL 54 111    BNP (last 3 results) No results for input(s): PROBNP in the last 8760 hours.  Lipid Profile: No results for input(s): CHOL, HDL, LDLCALC, TRIG, CHOLHDL, LDLDIRECT in the last 72 hours.  Thyroid  Function Tests: No results for input(s): TSH, T4TOTAL, FREET4, T3FREE,  THYROIDAB in the last 72 hours.  Anemia Panel: No results for input(s): VITAMINB12, FOLATE, FERRITIN, TIBC, IRON, RETICCTPCT in the last 72 hours.  Urine analysis:    Component Value Date/Time   COLORURINE AMBER (A) 04/10/2024 1219   APPEARANCEUR CLOUDY (A) 04/10/2024 1219   LABSPEC 1.024 04/10/2024 1219   PHURINE 5.0 04/10/2024 1219   GLUCOSEU NEGATIVE 04/10/2024 1219   HGBUR SMALL (A) 04/10/2024 1219   BILIRUBINUR NEGATIVE 04/10/2024 1219   KETONESUR NEGATIVE 04/10/2024 1219   PROTEINUR NEGATIVE 04/10/2024 1219   UROBILINOGEN 0.2 09/11/2011 0927   NITRITE NEGATIVE 04/10/2024 1219   LEUKOCYTESUR SMALL (A) 04/10/2024 1219    Sepsis Labs: Lactic Acid, Venous    Component Value Date/Time   LATICACIDVEN 3.3 (HH) 04/06/2024 2014    MICROBIOLOGY: No results found for this or any previous visit (from the past 240 hours).  RADIOLOGY STUDIES/RESULTS: No results found.    LOS: 6 days   Donalda Applebaum, MD  [facility]    To contact the attending provider between 7A-7P or the covering provider during after hours 7P-7A, please log into the web site [email or web address] and access using universal [place] password for that web site. If you do not have the password, please call the hospital operator.  04/12/2024, 9:16 AM

## 2024-04-12 NOTE — Plan of Care (Signed)
  Problem: Education: Goal: Ability to describe self-care measures that may prevent or decrease complications (Diabetes Survival Skills Education) will improve Outcome: Progressing Goal: Individualized Educational Video(s) Outcome: Progressing   Problem: Coping: Goal: Ability to adjust to condition or change in health will improve Outcome: Progressing   Problem: Metabolic: Goal: Ability to maintain appropriate glucose levels will improve Outcome: Progressing   Problem: Skin Integrity: Goal: Risk for impaired skin integrity will decrease Outcome: Progressing   Problem: Tissue Perfusion: Goal: Adequacy of tissue perfusion will improve Outcome: Progressing   Problem: Activity: Goal: Risk for activity intolerance will decrease Outcome: Progressing   Problem: Coping: Goal: Level of anxiety will decrease Outcome: Progressing   Problem: Elimination: Goal: Will not experience complications related to bowel motility Outcome: Progressing Goal: Will not experience complications related to urinary retention Outcome: Progressing   Problem: Pain Managment: Goal: General experience of comfort will improve and/or be controlled Outcome: Progressing   Problem: Skin Integrity: Goal: Risk for impaired skin integrity will decrease Outcome: Progressing   Problem: Safety: Goal: Ability to remain free from injury will improve Outcome: Progressing

## 2024-04-13 DIAGNOSIS — M7981 Nontraumatic hematoma of soft tissue: Secondary | ICD-10-CM | POA: Diagnosis not present

## 2024-04-13 DIAGNOSIS — I2699 Other pulmonary embolism without acute cor pulmonale: Secondary | ICD-10-CM | POA: Diagnosis not present

## 2024-04-13 DIAGNOSIS — S7012XA Contusion of left thigh, initial encounter: Secondary | ICD-10-CM | POA: Diagnosis not present

## 2024-04-13 DIAGNOSIS — N1832 Chronic kidney disease, stage 3b: Secondary | ICD-10-CM | POA: Diagnosis not present

## 2024-04-13 LAB — CBC
HCT: 28.9 % — ABNORMAL LOW (ref 36.0–46.0)
Hemoglobin: 9.5 g/dL — ABNORMAL LOW (ref 12.0–15.0)
MCH: 30.4 pg (ref 26.0–34.0)
MCHC: 32.9 g/dL (ref 30.0–36.0)
MCV: 92.3 fL (ref 80.0–100.0)
Platelets: 165 K/uL (ref 150–400)
RBC: 3.13 MIL/uL — ABNORMAL LOW (ref 3.87–5.11)
RDW: 14.6 % (ref 11.5–15.5)
WBC: 18.2 K/uL — ABNORMAL HIGH (ref 4.0–10.5)
nRBC: 0 % (ref 0.0–0.2)

## 2024-04-13 LAB — GLUCOSE, CAPILLARY
Glucose-Capillary: 100 mg/dL — ABNORMAL HIGH (ref 70–99)
Glucose-Capillary: 116 mg/dL — ABNORMAL HIGH (ref 70–99)
Glucose-Capillary: 106 mg/dL — ABNORMAL HIGH (ref 70–99)
Glucose-Capillary: 134 mg/dL — ABNORMAL HIGH (ref 70–99)

## 2024-04-13 LAB — CORTISOL-AM, BLOOD: Cortisol - AM: 26.9 ug/dL — ABNORMAL HIGH (ref 6.7–22.6)

## 2024-04-13 LAB — BASIC METABOLIC PANEL WITH GFR
Anion gap: 14 (ref 5–15)
BUN: 52 mg/dL — ABNORMAL HIGH (ref 8–23)
CO2: 24 mmol/L (ref 22–32)
Calcium: 7.8 mg/dL — ABNORMAL LOW (ref 8.9–10.3)
Chloride: 98 mmol/L (ref 98–111)
Creatinine, Ser: 2.68 mg/dL — ABNORMAL HIGH (ref 0.44–1.00)
GFR, Estimated: 17 mL/min — ABNORMAL LOW (ref >60)
Glucose, Bld: 114 mg/dL — ABNORMAL HIGH (ref 70–99)
Potassium: 4.1 mmol/L (ref 3.5–5.1)
Sodium: 136 mmol/L (ref 135–145)

## 2024-04-13 LAB — HEPARIN LEVEL (UNFRACTIONATED): Heparin Unfractionated: <0.1 [IU]/mL — ABNORMAL LOW (ref 0.30–0.70)

## 2024-04-13 MED ORDER — HEPARIN (PORCINE) 25000 UT/250ML-% IV SOLN
1300.0000 [IU]/h | INTRAVENOUS | Status: DC
  Administered 2024-04-13: 1000 [IU]/h via INTRAVENOUS
  Administered 2024-04-14: 1300 [IU]/h via INTRAVENOUS
  Filled 2024-04-13 (×2): qty 250

## 2024-04-13 MED ORDER — MIDODRINE HCL 5 MG PO TABS
5.0000 mg | ORAL_TABLET | Freq: Three times a day (TID) | ORAL | Status: DC
  Administered 2024-04-13 – 2024-04-14 (×4): 5 mg via ORAL
  Filled 2024-04-13 (×3): qty 1

## 2024-04-13 MED ORDER — POLYETHYLENE GLYCOL 3350 17 G PO PACK
17.0000 g | PACK | Freq: Every day | ORAL | Status: DC
  Administered 2024-04-14 – 2024-04-15 (×2): 17 g via ORAL
  Filled 2024-04-13 (×3): qty 1

## 2024-04-13 MED ORDER — SENNOSIDES-DOCUSATE SODIUM 8.6-50 MG PO TABS: 2.0000 | ORAL_TABLET | Freq: Every day | ORAL | Status: DC

## 2024-04-13 NOTE — Plan of Care (Signed)
  Problem: Fluid Volume: Goal: Ability to maintain a balanced intake and output will improve Outcome: Progressing   Problem: Metabolic: Goal: Ability to maintain appropriate glucose levels will improve Outcome: Progressing   Problem: Skin Integrity: Goal: Risk for impaired skin integrity will decrease Outcome: Progressing   Problem: Tissue Perfusion: Goal: Adequacy of tissue perfusion will improve Outcome: Progressing   Problem: Clinical Measurements: Goal: Ability to maintain clinical measurements within normal limits will improve Outcome: Progressing Goal: Will remain free from infection Outcome: Progressing Goal: Diagnostic test results will improve Outcome: Progressing Goal: Respiratory complications will improve Outcome: Progressing Goal: Cardiovascular complication will be avoided Outcome: Progressing   Problem: Elimination: Goal: Will not experience complications related to bowel motility Outcome: Progressing Goal: Will not experience complications related to urinary retention Outcome: Progressing   Problem: Nutrition: Goal: Adequate nutrition will be maintained Outcome: Progressing   Problem: Skin Integrity: Goal: Risk for impaired skin integrity will decrease Outcome: Progressing

## 2024-04-13 NOTE — TOC Progression Note (Signed)
 Transition of Care [facility]) - Progression Note    Patient Details  Name: Julie [name] MRN: [id number] Date of Birth: [birth year]  [facility]) CM/SW Contact  Inocente GORMAN Kindle, LCSW Phone Number: 04/13/2024, 4:13 PM  Clinical Narrative:    CSW continuing to follow for medical stability.    Expected Discharge Plan: Skilled Nursing Facility Barriers to Discharge: English as a second language teacher, Continued Medical Work up, SNF Pending bed offer  Expected Discharge Plan and Services In-house Referral: Clinical Social Work   Post Acute Care Choice: Skilled Nursing Facility Living arrangements for the past 2 months: Apartment                                       Social Determinants of Health (SDOH) Interventions SDOH Screenings   Food Insecurity: No Food Insecurity (04/07/2024)  Housing: Low Risk  (04/07/2024)  Transportation Needs: No Transportation Needs (04/07/2024)  Utilities: Not At Risk (04/07/2024)  Social Connections: Moderately Isolated (04/07/2024)  Tobacco Use: Medium Risk (04/06/2024)    Readmission Risk Interventions     No data to display

## 2024-04-13 NOTE — Progress Notes (Signed)
 PHARMACY - ANTICOAGULATION CONSULT NOTE  Pharmacy Consult for Heparin  Indication: pulmonary embolus / DVT s/p IVC filter  Allergies  Allergen Reactions   Ace Inhibitors Swelling and Other (See Comments)    Angioedemia   Advair Hfa [Fluticasone-Salmeterol] Other (See Comments)    Blurred vision    Amrix [Cyclobenzaprine Hcl] Other (See Comments)    Abnormal LFT(s)   Asa [Aspirin] Other (See Comments)    Bleeding    Lyrica [Pregabalin] Other (See Comments)    Patient concerned for possible side effects   Nsaids Other (See Comments)    Bleeding   Zocor [Simvastatin] Other (See Comments)    Myalgias    Adalat [Nifedipine] Anxiety    Patient Measurements: Height: 5' 4 (162.6 cm) Weight: 66 kg (145 lb 8.1 oz) IBW/kg (Calculated) : 54.7 HEPARIN  DW (KG): 66  Vital Signs: Temp: 97.4 F (36.3 C) (07/07 0738) Temp Source: Oral (07/07 0738) BP: 126/76 (07/07 0800) Pulse Rate: 94 (07/07 0738)  Labs: Recent Labs    04/10/24 2128 04/11/24 0429 04/11/24 0854 04/12/24 0424 04/13/24 0520  HGB 9.8*  --  9.6*  --  9.5*  HCT 29.4*  --  28.8*  --  28.9*  PLT 139*  --  133*  --  165  CREATININE  --  3.40*  --  3.08* 2.68*    Estimated Creatinine Clearance: 13.8 mL/min (A) (by C-G formula based on SCr of 2.68 mg/dL (H)).   Medical History: Past Medical History:  Diagnosis Date   Anemia    Asthma    Diabetes (HCC)    GERD (gastroesophageal reflux disease)    Gout    Heart palpitations    Hypertension    Hypothyroidism    Peptic ulcer     Assessment: 89 year old to restart anti-coagulation for PE/ DVT with heparin  s/p hematoma and IVC filter placement.  Planning DOAC eventually.  Goal of Therapy:  Heparin  level 0.3-0.7 units/ml Monitor platelets by anticoagulation protocol: Yes   Plan:  Heparin  drip at 1000 units / hr 8 hour heparin  level Daily heparin  level, CBC Planning transition to DOAC if HgB stable 7/8  Thank you. Olam Monte, PharmD 04/13/2024,9:37  AM

## 2024-04-13 NOTE — Progress Notes (Addendum)
 PHARMACY - ANTICOAGULATION CONSULT NOTE  Pharmacy Consult for Heparin  Indication: pulmonary embolus / DVT s/p IVC filter  Allergies  Allergen Reactions   Ace Inhibitors Swelling and Other (See Comments)    Angioedemia   Advair Hfa [Fluticasone-Salmeterol] Other (See Comments)    Blurred vision    Amrix [Cyclobenzaprine Hcl] Other (See Comments)    Abnormal LFT(s)   Asa [Aspirin] Other (See Comments)    Bleeding    Lyrica [Pregabalin] Other (See Comments)    Patient concerned for possible side effects   Nsaids Other (See Comments)    Bleeding   Zocor [Simvastatin] Other (See Comments)    Myalgias    Adalat [Nifedipine] Anxiety    Patient Measurements: Height: 5' 4 (162.6 cm) Weight: 66 kg (145 lb 8.1 oz) IBW/kg (Calculated) : 54.7 HEPARIN  DW (KG): 66  Vital Signs: Temp: 97.8 F (36.6 C) (07/07 2000) Temp Source: Oral (07/07 2000) BP: 112/52 (07/07 2000) Pulse Rate: 63 (07/07 2000)  Labs: Recent Labs    04/11/24 0429 04/11/24 0854 04/12/24 0424 04/13/24 0520 04/13/24 2046  HGB  --  9.6*  --  9.5*  --   HCT  --  28.8*  --  28.9*  --   PLT  --  133*  --  165  --   HEPARINUNFRC  --   --   --   --  <0.10*  CREATININE 3.40*  --  3.08* 2.68*  --     Estimated Creatinine Clearance: 13.8 mL/min (A) (by C-G formula based on SCr of 2.68 mg/dL (H)).   Medical History: Past Medical History:  Diagnosis Date   Anemia    Asthma    Diabetes (HCC)    GERD (gastroesophageal reflux disease)    Gout    Heart palpitations    Hypertension    Hypothyroidism    Peptic ulcer     Assessment: 89 year old with PE/ DVT on 6/30 complicated by left rectus muscle/left thigh hematoma. Pt had IVC filter placement 7/1. No anticoagulation prior to admission. Pharmacy consulted for heparin .     Heparin  level <0.1 is subtherapeutic on 1000 units/hr. Hgb stable.  No issues with infusion or bleeding per RN.  Goal of Therapy:  Heparin  level 0.3-0.7 units/ml Monitor platelets by  anticoagulation protocol: Yes   Plan:  Increase Heparin  to 1150 units/hr, no bolus  Monitor daily heparin  level, CBC, signs/symptoms of bleeding  Planning transition to DOAC if HgB stable 7/8  Jinnie Door, PharmD, [place], [facility] Clinical Pharmacist  Please check AMION for all [facility] Pharmacy phone numbers After 10:00 PM, call Main Pharmacy [phone]

## 2024-04-13 NOTE — Progress Notes (Signed)
 PROGRESS NOTE        PATIENT DETAILS Name: Julie [name] Age: 89 y.o. Sex: female Date of Birth: July 28, 1935 Admit Date: 04/06/2024 Admitting Physician Maximino DELENA Sharps, MD ERE:Rnoopwd, Lonell, DO  Brief Summary: Patient is a 88 y.o.  female with history of HTN, hypothyroidism, GERD, advanced arthritis, chronic neuropathy-near bedbound status-who presented with sudden onset of left leg pain that woke her up around 2 am on the day of her presentation-she was evaluated in the ED-due to concern for potential ischemic LLE-she underwent CTA study-which showed rectus sheath/left thigh hematoma as well as pulmonary emboli.  She was briefly hypotensive in the ED but responded to IVF bolus. She was evaluated by PCCM-not felt to require intervention for PE-also evaluated by vascular surgery-thought to have patent vasculature bilaterally.  She was subsequently admitted to the hospitalist service.  Significant events: 6/30>> presented with sudden onset of left leg pain-found to have left rectus muscle/left thigh hematoma and PE. 7/01>> IVC filter placement 7/23>> worsening renal function-CK not elevated-renal ultrasound but is on exam most without hydronephrosis.  IV fluid started.  Significant studies: 6/30>> CTA abdominal aorta with iliofemoral runoff: Partially visible pulmonary emboli, moderate size left rectus muscle hematoma, additional long segment left thigh intramuscular hematoma, advanced femoral/bilateral lower extremity calcified atherosclerosis. 6/30>> TTE: EF> 75% 7/01>> B/L LE Doppler: Acute DVT left femoral/left popliteal/left tibial/left peroneal vein 7/03>> renal ultrasound: No hydronephrosis but numerous renal cysts.  Significant microbiology data: None  Procedures: 7/01>> IVC filter placement  Consults: Vascular surgery PCCM IR  Subjective: Stating she had multiple BMs overnight.  Claims her appetite is slowly improving.  Objective: Vitals: Blood  pressure 126/76, pulse 94, temperature (!) 97.4 F (36.3 C), temperature source Oral, resp. rate (!) 22, height 5' 4 (1.626 m), weight 66 kg, SpO2 97%.   Exam: Awake/alert Chest: Clear to auscultation CVS: S1-S2 regular Abdomen: Soft nontender nondistended Extremity: Unchanged left thigh swelling. Neuro: LLE weak due to pain/swelling but otherwise nonfocal.  Pertinent Labs/Radiology:    Latest Ref Rng & Units 04/13/2024    5:20 AM 04/11/2024    8:54 AM 04/10/2024    9:28 PM  CBC  WBC 4.0 - 10.5 K/uL 18.2  12.2  11.6   Hemoglobin 12.0 - 15.0 g/dL 9.5  9.6  9.8   Hematocrit 36.0 - 46.0 % 28.9  28.8  29.4   Platelets 150 - 400 K/uL 165  133  139     Lab Results  Component Value Date   NA 136 04/13/2024   K 4.1 04/13/2024   CL 98 04/13/2024   CO2 24 04/13/2024    Assessment/Plan: Left rectus muscle hematoma/Left thigh hematoma with acute blood loss anemia Unclear etiology-denies obvious trauma-not on anticoagulation S/p 2 units of PRBC on 7/1-Hb stable since then morning-continue to follow CBC.  Acute bilateral PE with left lower extremity DVT Not a candidate for anticoagulation S/p IVC filter placement by IR on 7/01 Probably provoked VTE in the setting left thigh hematoma Hb stable for almost a week-no evidence of recurrence of bleeding-exam stable-will challenge with IV heparin  without bolus while in the hospital-if remains stable for 24-48 hours we can transition to a DOAC.  AKI on CKD stage IIIb AKI initially felt to be hemodynamically mediated-had improved-but on 7/3-significant worsening-suspicion for contrast-induced nephropathy  (CTA study on 6/30-and IVC filter on 7/1).  Oral intake is overall poor but slowly improving Creatinine now downtrending Continue Foley catheter for now-urine output is picking up Will stop IV fluids today. Continue to avoid nephrotoxic agents Continue strict intake/output monitoring Had discussed with nephrology-Dr. Dolan on 7/4-since  renal function improving-will hold off on formal consultation at this point Keep Foley catheter in place for another few more days till she is a bit more mobile.  Leukocytosis No obvious foci of infection Probably reactive-?  Inflammation around hematoma site. Monitor off antibiotics.  Hyperkalemia Secondary to worsening renal function Resolved with Lokelma  and IVF with bicarb.  Nausea/vomiting Resolved No apparent cause noted on CT imaging Supportive care with antiemetics  Constipation Had multiple BMs overnight following initiation of lactulose /MiraLAX /senna Will keep on maintenance MiraLAX /senna for now.  PAD Per vascular surgery-has patent vasculature bilaterally No intervention recommended Unable to use aspirin Follow-up with vascular surgery in the outpatient setting  HTN BP was soft for the past several days-but now slowly creeping up AM cortisol stable On midodrine  for BP support but since starting to creep up-will start titrating down midodrine -decrease to 5 mg 3 times daily today.    History of peripheral neuropathy/severe arthritis Chronic debility/deconditioning Supportive care PT/OT eval when able  Code status:   Code Status: Limited: Do not attempt resuscitation (DNR) -DNR-LIMITED -Do Not Intubate/DNI    DVT Prophylaxis: SCDs if possible-anticoagulation contraindicated due to left rectus muscle/left thigh hematoma.   Family Communication: Niece-Brenda [phone] updated over the phone 7/5   Disposition Plan: Status is: Inpatient Remains inpatient appropriate because: Severity of illness   Planned Discharge Destination:Skilled nursing facility   Diet: Diet Order             Diet renal with fluid restriction Fluid restriction: 1200 mL Fluid; Room service appropriate? Yes; Fluid consistency: Thin  Diet effective now                     Antimicrobial agents: Anti-infectives (From admission, onward)    None         MEDICATIONS: Scheduled Meds:  Chlorhexidine  Gluconate Cloth  6 each Topical Daily   midodrine   5 mg Oral TID WC   pantoprazole   40 mg Oral Daily   sodium chloride  flush  3 mL Intravenous Q12H   Continuous Infusions:  sodium chloride  Stopped (04/13/24 0849)    PRN Meds:.acetaminophen  **OR** acetaminophen , albuterol , dextrose , LORazepam , ondansetron  (ZOFRAN ) IV, prochlorperazine , traMADol    I have personally reviewed following labs and imaging studies  LABORATORY DATA: CBC: Recent Labs  Lab 04/09/24 0552 04/10/24 0455 04/10/24 2128 04/11/24 0854 04/13/24 0520  WBC 16.3* 13.8* 11.6* 12.2* 18.2*  HGB 11.3* 10.8* 9.8* 9.6* 9.5*  HCT 34.9* 32.7* 29.4* 28.8* 28.9*  MCV 92.8 91.3 91.3 91.7 92.3  PLT 146* 128* 139* 133* 165    Basic Metabolic Panel: Recent Labs  Lab 04/09/24 0846 04/10/24 0455 04/11/24 0429 04/12/24 0424 04/13/24 0520  NA 137 136 137 135 136  K 5.7* 4.6 4.1 3.8 4.1  CL 107 100 99 97* 98  CO2 15* 20* 27 27 24   GLUCOSE 109* 133* 127* 103* 114*  BUN 48* 56* 56* 57* 52*  CREATININE 3.44* 3.56* 3.40* 3.08* 2.68*  CALCIUM  8.5* 7.6* 7.4* 7.4* 7.8*  PHOS  --   --  5.5*  --   --     GFR: Estimated Creatinine Clearance: 13.8 mL/min (A) (by C-G formula based on SCr of 2.68 mg/dL (H)).  Liver Function Tests: Recent Labs  Lab 04/11/24 [phone]  ALBUMIN 2.3*   No results for input(s): LIPASE, AMYLASE in the last 168 hours.  No results for input(s): AMMONIA in the last 168 hours.  Coagulation Profile: No results for input(s): INR, PROTIME in the last 168 hours.   Cardiac Enzymes: Recent Labs  Lab 04/09/24 1120  CKTOTAL 111    BNP (last 3 results) No results for input(s): PROBNP in the last 8760 hours.  Lipid Profile: No results for input(s): CHOL, HDL, LDLCALC, TRIG, CHOLHDL, LDLDIRECT in the last 72 hours.  Thyroid  Function Tests: No results for input(s): TSH, T4TOTAL, FREET4, T3FREE, THYROIDAB in the  last 72 hours.  Anemia Panel: No results for input(s): VITAMINB12, FOLATE, FERRITIN, TIBC, IRON, RETICCTPCT in the last 72 hours.  Urine analysis:    Component Value Date/Time   COLORURINE AMBER (A) 04/10/2024 1219   APPEARANCEUR CLOUDY (A) 04/10/2024 1219   LABSPEC 1.024 04/10/2024 1219   PHURINE 5.0 04/10/2024 1219   GLUCOSEU NEGATIVE 04/10/2024 1219   HGBUR SMALL (A) 04/10/2024 1219   BILIRUBINUR NEGATIVE 04/10/2024 1219   KETONESUR NEGATIVE 04/10/2024 1219   PROTEINUR NEGATIVE 04/10/2024 1219   UROBILINOGEN 0.2 09/11/2011 0927   NITRITE NEGATIVE 04/10/2024 1219   LEUKOCYTESUR SMALL (A) 04/10/2024 1219    Sepsis Labs: Lactic Acid, Venous    Component Value Date/Time   LATICACIDVEN 3.3 (HH) 04/06/2024 2014    MICROBIOLOGY: No results found for this or any previous visit (from the past 240 hours).  RADIOLOGY STUDIES/RESULTS: No results found.    LOS: 7 days   Donalda Applebaum, MD  [facility]    To contact the attending provider between 7A-7P or the covering provider during after hours 7P-7A, please log into the web site [email or web address] and access using universal [place] password for that web site. If you do not have the password, please call the hospital operator.  04/13/2024, 9:31 AM

## 2024-04-13 NOTE — Plan of Care (Signed)

## 2024-04-14 DIAGNOSIS — N1832 Chronic kidney disease, stage 3b: Secondary | ICD-10-CM | POA: Diagnosis not present

## 2024-04-14 DIAGNOSIS — S7012XA Contusion of left thigh, initial encounter: Secondary | ICD-10-CM | POA: Diagnosis not present

## 2024-04-14 DIAGNOSIS — M7981 Nontraumatic hematoma of soft tissue: Secondary | ICD-10-CM | POA: Diagnosis not present

## 2024-04-14 DIAGNOSIS — I2699 Other pulmonary embolism without acute cor pulmonale: Secondary | ICD-10-CM | POA: Diagnosis not present

## 2024-04-14 LAB — CBC
HCT: 25 % — ABNORMAL LOW (ref 36.0–46.0)
HCT: 24 % — ABNORMAL LOW (ref 36.0–46.0)
Hemoglobin: 8.1 g/dL — ABNORMAL LOW (ref 12.0–15.0)
Hemoglobin: 8 g/dL — ABNORMAL LOW (ref 12.0–15.0)
MCH: 29.9 pg (ref 26.0–34.0)
MCH: 29.9 pg (ref 26.0–34.0)
MCHC: 32.4 g/dL (ref 30.0–36.0)
MCHC: 33.3 g/dL (ref 30.0–36.0)
MCV: 92.3 fL (ref 80.0–100.0)
MCV: 89.6 fL (ref 80.0–100.0)
Platelets: 191 K/uL (ref 150–400)
Platelets: 211 K/uL (ref 150–400)
RBC: 2.71 MIL/uL — ABNORMAL LOW (ref 3.87–5.11)
RBC: 2.68 MIL/uL — ABNORMAL LOW (ref 3.87–5.11)
RDW: 14.6 % (ref 11.5–15.5)
RDW: 14.6 % (ref 11.5–15.5)
WBC: 14.6 K/uL — ABNORMAL HIGH (ref 4.0–10.5)
WBC: 14.3 K/uL — ABNORMAL HIGH (ref 4.0–10.5)
nRBC: 0 % (ref 0.0–0.2)
nRBC: 0 % (ref 0.0–0.2)

## 2024-04-14 LAB — BASIC METABOLIC PANEL WITH GFR
Anion gap: 9 (ref 5–15)
BUN: 52 mg/dL — ABNORMAL HIGH (ref 8–23)
CO2: 26 mmol/L (ref 22–32)
Calcium: 7.7 mg/dL — ABNORMAL LOW (ref 8.9–10.3)
Chloride: 101 mmol/L (ref 98–111)
Creatinine, Ser: 2.31 mg/dL — ABNORMAL HIGH (ref 0.44–1.00)
GFR, Estimated: 20 mL/min — ABNORMAL LOW (ref >60)
Glucose, Bld: 95 mg/dL (ref 70–99)
Potassium: 3.8 mmol/L (ref 3.5–5.1)
Sodium: 136 mmol/L (ref 135–145)

## 2024-04-14 LAB — HEPARIN LEVEL (UNFRACTIONATED): Heparin Unfractionated: <0.1 [IU]/mL — ABNORMAL LOW (ref 0.30–0.70)

## 2024-04-14 LAB — GLUCOSE, CAPILLARY
Glucose-Capillary: 94 mg/dL (ref 70–99)
Glucose-Capillary: 117 mg/dL — ABNORMAL HIGH (ref 70–99)
Glucose-Capillary: 118 mg/dL — ABNORMAL HIGH (ref 70–99)

## 2024-04-14 MED ORDER — SENNOSIDES-DOCUSATE SODIUM 8.6-50 MG PO TABS
2.0000 | ORAL_TABLET | Freq: Two times a day (BID) | ORAL | Status: DC
  Administered 2024-04-14 – 2024-04-16 (×5): 2 via ORAL
  Filled 2024-04-14 (×7): qty 2

## 2024-04-14 MED ORDER — MIDODRINE HCL 5 MG PO TABS
7.5000 mg | ORAL_TABLET | Freq: Three times a day (TID) | ORAL | Status: DC
  Administered 2024-04-14 – 2024-04-17 (×8): 7.5 mg via ORAL
  Filled 2024-04-14 (×11): qty 2

## 2024-04-14 NOTE — Progress Notes (Signed)
 PROGRESS NOTE        PATIENT DETAILS Name: Julie [name] Age: 89 y.o. Sex: female Date of Birth: July 27, 1935 Admit Date: 04/06/2024 Admitting Physician Maximino DELENA Sharps, MD ERE:Rnoopwd, Lonell, DO  Brief Summary: Patient is a 89 y.o.  female with history of HTN, hypothyroidism, GERD, advanced arthritis, chronic neuropathy-near bedbound status-who presented with sudden onset of left leg pain that woke her up around 2 am on the day of her presentation-she was evaluated in the ED-due to concern for potential ischemic LLE-she underwent CTA study-which showed rectus sheath/left thigh hematoma as well as pulmonary emboli.  She was briefly hypotensive in the ED but responded to IVF bolus. She was evaluated by PCCM-not felt to require intervention for PE-also evaluated by vascular surgery-thought to have patent vasculature bilaterally.  She was subsequently admitted to the hospitalist service.  Significant events: 6/30>> presented with sudden onset of left leg pain-found to have left rectus muscle/left thigh hematoma and PE. 7/01>> IVC filter placement 7/03>> worsening renal function-CK not elevated-renal ultrasound but is on exam most without hydronephrosis.  IV fluid started. 7/07>> risk/benefits of anticoagulation discussed with patient-IV heparin  started today  Significant studies: 6/30>> CTA abdominal aorta with iliofemoral runoff: Partially visible pulmonary emboli, moderate size left rectus muscle hematoma, additional long segment left thigh intramuscular hematoma, advanced femoral/bilateral lower extremity calcified atherosclerosis. 6/30>> TTE: EF> 75% 7/01>> B/L LE Doppler: Acute DVT left femoral/left popliteal/left tibial/left peroneal vein 7/03>> renal ultrasound: No hydronephrosis but numerous renal cysts.  Significant microbiology data: None  Procedures: 7/01>> IVC filter placement  Consults: Vascular surgery PCCM IR  Subjective: No major issues  overnight-she claims RLE swelling is slightly better-she is now able to move it a bit more.  Objective: Vitals: Blood pressure (!) 87/52, pulse 77, temperature 97.7 F (36.5 C), temperature source Oral, resp. rate 20, height 5' 4 (1.626 m), weight 66 kg, SpO2 94%.   Exam: Awake/alert Chest: Clear to auscultation CVS: S1-S2 regular Abdomen: Soft nontender nondistended Extremities: Unchanged left thigh swelling Neurology: Nonfocal-decreased movement in LLE but improving-significant swelling still present.  Pertinent Labs/Radiology:    Latest Ref Rng & Units 04/14/2024    5:49 AM 04/13/2024    5:20 AM 04/11/2024    8:54 AM  CBC  WBC 4.0 - 10.5 K/uL 14.6  18.2  12.2   Hemoglobin 12.0 - 15.0 g/dL 8.1  9.5  9.6   Hematocrit 36.0 - 46.0 % 25.0  28.9  28.8   Platelets 150 - 400 K/uL 191  165  133     Lab Results  Component Value Date   NA 136 04/14/2024   K 3.8 04/14/2024   CL 101 04/14/2024   CO2 26 04/14/2024    Assessment/Plan: Left rectus muscle hematoma/Left thigh hematoma with acute blood loss anemia Unclear etiology-denies obvious trauma-not on anticoagulation S/p 2 units of PRBC on 7/1-Hb stable-but seems to be slightly down today-will repeat CBC this afternoon.  Apparently  Acute bilateral PE with left lower extremity DVT Not a candidate for anticoagulation S/p IVC filter placement by IR on 7/01 Probably provoked VTE in the setting left thigh hematoma Since Hb stable for a week-with no bleeding/worsening swelling-after risk/benefits discussed with patient on 7/7-she was started on IV heparin -slight decrease in Hb today-but clinically appears stable-cautiously continuing with heparin  for a few more hours-will recheck CBC later this afternoon.  If  downtrend continues-will probably need to stop heparin .  AKI on CKD stage IIIb AKI initially felt to be hemodynamically mediated-had improved-but on 7/3-significant worsening-suspicion for contrast-induced nephropathy  (CTA study on  6/30-and IVC filter on 7/1).   Oral intake continues to improve Creatinine now downtrending Continue Foley catheter for now-urine output is picking up Continue to avoid nephrotoxic agents Continue strict intake/output monitoring Had discussed with nephrology-Dr. Dolan on 7/4-since renal function improving-will hold off on formal consultation at this point Keep Foley catheter in place for another few more days till she is a bit more mobile.  Leukocytosis No obvious foci of infection Probably reactive-?  Inflammation around hematoma site. Monitor off antibiotics.  Hyperkalemia Secondary to worsening renal function Resolved with Lokelma  and IVF with bicarb.  Nausea/vomiting Resolved No apparent cause noted on CT imaging Supportive care with antiemetics  Constipation Stable-Had multiple BMs every other day Will keep on maintenance MiraLAX /senna for now.  PAD Per vascular surgery-has patent vasculature bilaterally No intervention recommended Unable to use aspirin Follow-up with vascular surgery in the outpatient setting  HTN BP was soft for the past several days-but now slowly creeping up AM cortisol stable Continue midodrine -dosage decreased to 7.5 mg 3 times daily-will slowly titrate down over the next several days.  History of peripheral neuropathy/severe arthritis Chronic debility/deconditioning Supportive care PT/OT eval when able  Code status:   Code Status: Limited: Do not attempt resuscitation (DNR) -DNR-LIMITED -Do Not Intubate/DNI    DVT Prophylaxis: SCDs if possible-anticoagulation contraindicated due to left rectus muscle/left thigh hematoma.   Family Communication: Niece-Brenda [phone] updated over the phone 7/5   Disposition Plan: Status is: Inpatient Remains inpatient appropriate because: Severity of illness   Planned Discharge Destination:Skilled nursing facility   Diet: Diet Order             Diet renal with fluid restriction  Fluid restriction: 1200 mL Fluid; Room service appropriate? Yes; Fluid consistency: Thin  Diet effective now                     Antimicrobial agents: Anti-infectives (From admission, onward)    None        MEDICATIONS: Scheduled Meds:  Chlorhexidine  Gluconate Cloth  6 each Topical Daily   midodrine   7.5 mg Oral TID WC   pantoprazole   40 mg Oral Daily   polyethylene glycol  17 g Oral Daily   senna-docusate  2 tablet Oral QHS   sodium chloride  flush  3 mL Intravenous Q12H   Continuous Infusions:  heparin  1,150 Units/hr (04/14/24 0557)    PRN Meds:.acetaminophen  **OR** acetaminophen , albuterol , dextrose , LORazepam , ondansetron  (ZOFRAN ) IV, prochlorperazine , traMADol    I have personally reviewed following labs and imaging studies  LABORATORY DATA: CBC: Recent Labs  Lab 04/10/24 0455 04/10/24 2128 04/11/24 0854 04/13/24 0520 04/14/24 0549  WBC 13.8* 11.6* 12.2* 18.2* 14.6*  HGB 10.8* 9.8* 9.6* 9.5* 8.1*  HCT 32.7* 29.4* 28.8* 28.9* 25.0*  MCV 91.3 91.3 91.7 92.3 92.3  PLT 128* 139* 133* 165 191    Basic Metabolic Panel: Recent Labs  Lab 04/10/24 0455 04/11/24 0429 04/12/24 0424 04/13/24 0520 04/14/24 0549  NA 136 137 135 136 136  K 4.6 4.1 3.8 4.1 3.8  CL 100 99 97* 98 101  CO2 20* 27 27 24 26   GLUCOSE 133* 127* 103* 114* 95  BUN 56* 56* 57* 52* 52*  CREATININE 3.56* 3.40* 3.08* 2.68* 2.31*  CALCIUM  7.6* 7.4* 7.4* 7.8* 7.7*  PHOS  --  5.5*  --   --   --  GFR: Estimated Creatinine Clearance: 16 mL/min (A) (by C-G formula based on SCr of 2.31 mg/dL (H)).  Liver Function Tests: Recent Labs  Lab 04/11/24 0429  ALBUMIN 2.3*   No results for input(s): LIPASE, AMYLASE in the last 168 hours.  No results for input(s): AMMONIA in the last 168 hours.  Coagulation Profile: No results for input(s): INR, PROTIME in the last 168 hours.   Cardiac Enzymes: Recent Labs  Lab 04/09/24 1120  CKTOTAL 111    BNP (last 3 results) No  results for input(s): PROBNP in the last 8760 hours.  Lipid Profile: No results for input(s): CHOL, HDL, LDLCALC, TRIG, CHOLHDL, LDLDIRECT in the last 72 hours.  Thyroid  Function Tests: No results for input(s): TSH, T4TOTAL, FREET4, T3FREE, THYROIDAB in the last 72 hours.  Anemia Panel: No results for input(s): VITAMINB12, FOLATE, FERRITIN, TIBC, IRON, RETICCTPCT in the last 72 hours.  Urine analysis:    Component Value Date/Time   COLORURINE AMBER (A) 04/10/2024 1219   APPEARANCEUR CLOUDY (A) 04/10/2024 1219   LABSPEC 1.024 04/10/2024 1219   PHURINE 5.0 04/10/2024 1219   GLUCOSEU NEGATIVE 04/10/2024 1219   HGBUR SMALL (A) 04/10/2024 1219   BILIRUBINUR NEGATIVE 04/10/2024 1219   KETONESUR NEGATIVE 04/10/2024 1219   PROTEINUR NEGATIVE 04/10/2024 1219   UROBILINOGEN 0.2 09/11/2011 0927   NITRITE NEGATIVE 04/10/2024 1219   LEUKOCYTESUR SMALL (A) 04/10/2024 1219    Sepsis Labs: Lactic Acid, Venous    Component Value Date/Time   LATICACIDVEN 3.3 (HH) 04/06/2024 2014    MICROBIOLOGY: No results found for this or any previous visit (from the past 240 hours).  RADIOLOGY STUDIES/RESULTS: No results found.    LOS: 8 days   Donalda Applebaum, MD  [facility]    To contact the attending provider between 7A-7P or the covering provider during after hours 7P-7A, please log into the web site [email or web address] and access using universal [place] password for that web site. If you do not have the password, please call the hospital operator.  04/14/2024, 9:24 AM

## 2024-04-14 NOTE — Plan of Care (Signed)

## 2024-04-14 NOTE — Plan of Care (Signed)

## 2024-04-14 NOTE — Progress Notes (Signed)
 Physical Therapy Treatment Patient Details Name: Julie [name] MRN: [id number] DOB: Oct 14, 1934 Today's Date: 04/14/2024   History of Present Illness 89 y.o. female presents to [facility] 04/06/24 with LLE pain/numbness. Pt with hematoma of L thigh and rectus femoris. Pt also with B acute PE and acute LLE DVT. 7/1 IVC filter. PMHx: hypertension, hypothyroidism, gout, GERD, CKD    PT Comments  Pt with fair tolerance to treatment today. Pt able to sit EOB with +2 Mod A and perform seated therex. Pt very limited by LUE and LLE pain today. No change in DC/DME recs at this time. PT will continue to follow.     If plan is discharge home, recommend the following: A lot of help with walking and/or transfers;A lot of help with bathing/dressing/bathroom;Assistance with cooking/housework;Assist for transportation;Help with stairs or ramp for entrance   Can travel by private vehicle     No  Equipment Recommendations  None recommended by PT    Recommendations for Other Services       Precautions / Restrictions Precautions Precautions: Fall Recall of Precautions/Restrictions: Intact Precaution/Restrictions Comments: LLE hematoma Restrictions Weight Bearing Restrictions Per Provider Order: No     Mobility  Bed Mobility Overal bed mobility: Needs Assistance Bed Mobility: Supine to Sit, Sit to Supine     Supine to sit: Mod assist, +2 for physical assistance Sit to supine: +2 for physical assistance, Mod assist   General bed mobility comments: +2 mod A for trunk elevation and return to bed for LLE. Pt very limited by pain today.    Transfers                   General transfer comment: Deferred due to pain.    Ambulation/Gait               General Gait Details: unable this date   Stairs             Wheelchair Mobility     Tilt Bed    Modified Rankin (Stroke Patients Only)       Balance Overall balance assessment: Needs assistance Sitting-balance support: No  upper extremity supported, Feet supported Sitting balance-Leahy Scale: Fair Sitting balance - Comments: Requesting back support while sitting but not noted to be leaning at all.                                    Communication Communication Communication: Impaired Factors Affecting Communication: Hearing impaired  Cognition Arousal: Alert Behavior During Therapy: WFL for tasks assessed/performed   PT - Cognitive impairments: No apparent impairments                       PT - Cognition Comments: Pt with good sense of humor Following commands: Intact      Cueing Cueing Techniques: Verbal cues, Tactile cues  Exercises General Exercises - Lower Extremity Ankle Circles/Pumps: AROM, Both, 10 reps, Seated Long Arc Quad: AROM, Both, 10 reps, Seated Hip Flexion/Marching: AROM, Both, 5 reps, Seated    General Comments        Pertinent Vitals/Pain Pain Assessment Pain Assessment: Faces Faces Pain Scale: Hurts whole lot Pain Location: L LE and L UE with movement Pain Descriptors / Indicators: Aching, Discomfort Pain Intervention(s): Monitored during session, Limited activity within patient's tolerance, Heat applied, Repositioned    Home Living  Prior Function            PT Goals (current goals can now be found in the care plan section) Progress towards PT goals: Progressing toward goals    Frequency    Min 2X/week      PT Plan      Co-evaluation              AM-PAC PT 6 Clicks Mobility   Outcome Measure  Help needed turning from your back to your side while in a flat bed without using bedrails?: A Little Help needed moving from lying on your back to sitting on the side of a flat bed without using bedrails?: A Lot Help needed moving to and from a bed to a chair (including a wheelchair)?: A Lot Help needed standing up from a chair using your arms (e.g., wheelchair or bedside chair)?: A Lot Help  needed to walk in hospital room?: Total Help needed climbing 3-5 steps with a railing? : Total 6 Click Score: 11    End of Session   Activity Tolerance: Patient limited by pain Patient left: in bed;with call bell/phone within reach;with bed alarm set Nurse Communication: Mobility status PT Visit Diagnosis: Unsteadiness on feet (R26.81);Other abnormalities of gait and mobility (R26.89);Muscle weakness (generalized) (M62.81);History of falling (Z91.81)     Time: 8561-8544 PT Time Calculation (min) (ACUTE ONLY): 17 min  Charges:    $Therapeutic Exercise: 8-22 mins PT General Charges $$ ACUTE PT VISIT: 1 Visit                     Bennett Ram B, PT, DPT Acute Rehab Services [phone]    [name] 04/14/2024, 4:10 PM

## 2024-04-14 NOTE — TOC Progression Note (Signed)
 Transition of Care [facility]) - Progression Note    Patient Details  Name: Julie [name] MRN: [id number] Date of Birth: 25-Jan-1935  Transition of Care [facility] CM/SW Contact  Inocente GORMAN Kindle, LCSW Phone Number: 04/14/2024, 4:34 PM  Clinical Narrative:    CSW confirmed with patient's niece, Erminio, that [facility] is their SNF choice when stable. CSW initiated and received insurance approval, Ref# [id number], effective 04/16/2024-04/20/2024. [facility] reports they will have a bed for patient on Friday.    Expected Discharge Plan: Skilled Nursing Facility Barriers to Discharge: Continued Medical Work up  Expected Discharge Plan and Services In-house Referral: Clinical Social Work   Post Acute Care Choice: Skilled Nursing Facility Living arrangements for the past 2 months: Apartment                                       Social Determinants of Health (SDOH) Interventions SDOH Screenings   Food Insecurity: No Food Insecurity (04/07/2024)  Housing: Low Risk  (04/07/2024)  Transportation Needs: No Transportation Needs (04/07/2024)  Utilities: Not At Risk (04/07/2024)  Social Connections: Moderately Isolated (04/07/2024)  Tobacco Use: Medium Risk (04/06/2024)    Readmission Risk Interventions     No data to display

## 2024-04-14 NOTE — Progress Notes (Signed)
 PHARMACY - ANTICOAGULATION CONSULT NOTE  Pharmacy Consult for Heparin  Indication: pulmonary embolus / DVT s/p IVC filter  Allergies  Allergen Reactions   Ace Inhibitors Swelling and Other (See Comments)    Angioedemia   Advair Hfa [Fluticasone-Salmeterol] Other (See Comments)    Blurred vision    Amrix [Cyclobenzaprine Hcl] Other (See Comments)    Abnormal LFT(s)   Asa [Aspirin] Other (See Comments)    Bleeding    Lyrica [Pregabalin] Other (See Comments)    Patient concerned for possible side effects   Nsaids Other (See Comments)    Bleeding   Zocor [Simvastatin] Other (See Comments)    Myalgias    Adalat [Nifedipine] Anxiety    Patient Measurements: Height: 5' 4 (162.6 cm) Weight: 66 kg (145 lb 8.1 oz) IBW/kg (Calculated) : 54.7 HEPARIN  DW (KG): 66  Vital Signs: Temp: 97.7 F (36.5 C) (07/08 0000) Temp Source: Oral (07/08 0000) BP: 87/52 (07/08 0400) Pulse Rate: 77 (07/08 0400)  Labs: Recent Labs    04/12/24 0424 04/13/24 0520 04/13/24 2046 04/14/24 0549  HGB  --  9.5*  --  8.1*  HCT  --  28.9*  --  25.0*  PLT  --  165  --  191  HEPARINUNFRC  --   --  <0.10* <0.10*  CREATININE 3.08* 2.68*  --  2.31*    Estimated Creatinine Clearance: 16 mL/min (A) (by C-G formula based on SCr of 2.31 mg/dL (H)).   Medical History: Past Medical History:  Diagnosis Date   Anemia    Asthma    Diabetes (HCC)    GERD (gastroesophageal reflux disease)    Gout    Heart palpitations    Hypertension    Hypothyroidism    Peptic ulcer     Assessment: 89 year old with PE/ DVT on 6/30 complicated by left rectus muscle/left thigh hematoma. Pt had IVC filter placement 7/1. No anticoagulation prior to admission. Pharmacy consulted for heparin .     Heparin  level <0.1 is subtherapeutic on 1150 units / hr  Goal of Therapy:  Heparin  level 0.3-0.7 units/ml Monitor platelets by anticoagulation protocol: Yes   Plan:  Increase Heparin  to 1300 units/hr, no bolus  Monitor  daily heparin  level, CBC, signs/symptoms of bleeding  Planning transition to DOAC pending HgB  Thank you Olam Monte, PharmD  Please check AMION for all [facility] Pharmacy phone numbers After 10:00 PM, call Main Pharmacy [phone]

## 2024-04-15 DIAGNOSIS — D72829 Elevated white blood cell count, unspecified: Secondary | ICD-10-CM | POA: Diagnosis not present

## 2024-04-15 DIAGNOSIS — S301XXA Contusion of abdominal wall, initial encounter: Secondary | ICD-10-CM | POA: Diagnosis not present

## 2024-04-15 DIAGNOSIS — I2699 Other pulmonary embolism without acute cor pulmonale: Secondary | ICD-10-CM | POA: Diagnosis not present

## 2024-04-15 LAB — CBC
HCT: 24 % — ABNORMAL LOW (ref 36.0–46.0)
Hemoglobin: 7.8 g/dL — ABNORMAL LOW (ref 12.0–15.0)
MCH: 30.1 pg (ref 26.0–34.0)
MCHC: 32.5 g/dL (ref 30.0–36.0)
MCV: 92.7 fL (ref 80.0–100.0)
Platelets: 214 K/uL (ref 150–400)
RBC: 2.59 MIL/uL — ABNORMAL LOW (ref 3.87–5.11)
RDW: 14.8 % (ref 11.5–15.5)
WBC: 11.1 K/uL — ABNORMAL HIGH (ref 4.0–10.5)
nRBC: 0 % (ref 0.0–0.2)

## 2024-04-15 LAB — BASIC METABOLIC PANEL WITH GFR
Anion gap: 8 (ref 5–15)
BUN: 51 mg/dL — ABNORMAL HIGH (ref 8–23)
CO2: 26 mmol/L (ref 22–32)
Calcium: 7.3 mg/dL — ABNORMAL LOW (ref 8.9–10.3)
Chloride: 99 mmol/L (ref 98–111)
Creatinine, Ser: 2.17 mg/dL — ABNORMAL HIGH (ref 0.44–1.00)
GFR, Estimated: 22 mL/min — ABNORMAL LOW (ref >60)
Glucose, Bld: 103 mg/dL — ABNORMAL HIGH (ref 70–99)
Potassium: 3.3 mmol/L — ABNORMAL LOW (ref 3.5–5.1)
Sodium: 133 mmol/L — ABNORMAL LOW (ref 135–145)

## 2024-04-15 LAB — GLUCOSE, CAPILLARY
Glucose-Capillary: 95 mg/dL (ref 70–99)
Glucose-Capillary: 99 mg/dL (ref 70–99)
Glucose-Capillary: 119 mg/dL — ABNORMAL HIGH (ref 70–99)
Glucose-Capillary: 109 mg/dL — ABNORMAL HIGH (ref 70–99)

## 2024-04-15 MED ORDER — POTASSIUM CHLORIDE 20 MEQ PO PACK
40.0000 meq | PACK | Freq: Once | ORAL | Status: AC
  Administered 2024-04-15: 40 meq via ORAL
  Filled 2024-04-15: qty 2

## 2024-04-15 NOTE — Plan of Care (Signed)

## 2024-04-15 NOTE — Progress Notes (Signed)
 PROGRESS NOTE        PATIENT DETAILS Name: Julie [name] Age: 89 y.o. Sex: female Date of Birth: 1935/03/19 Admit Date: 04/06/2024 Admitting Physician Maximino DELENA Sharps, MD ERE:Rnoopwd, Lonell, DO  Brief Summary: Patient is a 89 y.o.  female with history of HTN, hypothyroidism, GERD, advanced arthritis, chronic neuropathy-near bedbound status-who presented with sudden onset of left leg pain that woke her up around 2 am on the day of her presentation-she was evaluated in the ED-due to concern for potential ischemic LLE-she underwent CTA study-which showed rectus sheath/left thigh hematoma as well as pulmonary emboli.  She was briefly hypotensive in the ED but responded to IVF bolus. She was evaluated by PCCM-not felt to require intervention for PE-also evaluated by vascular surgery-thought to have patent vasculature bilaterally.  She was subsequently admitted to the hospitalist service.  Significant events: 6/30>> presented with sudden onset of left leg pain-found to have left rectus muscle/left thigh hematoma and PE. 7/01>> IVC filter placement 7/03>> worsening renal function-CK not elevated-renal ultrasound but is on exam most without hydronephrosis.  IV fluid started. 7/07>> risk/benefits of anticoagulation discussed with patient-IV heparin  started today 7/08>> Hb down to 8.0 (9.5 on 7/7)-heparin  discontinued.  Significant studies: 6/30>> CTA abdominal aorta with iliofemoral runoff: Partially visible pulmonary emboli, moderate size left rectus muscle hematoma, additional long segment left thigh intramuscular hematoma, advanced femoral/bilateral lower extremity calcified atherosclerosis. 6/30>> TTE: EF> 75% 7/01>> B/L LE Doppler: Acute DVT left femoral/left popliteal/left tibial/left peroneal vein 7/03>> renal ultrasound: No hydronephrosis but numerous renal cysts.  Significant microbiology data: None  Procedures: 7/01>> IVC filter  placement  Consults: Vascular surgery PCCM IR  Subjective: No major issues overnight-no complaints-left thigh appears unchanged.  Objective: Vitals: Blood pressure 102/66, pulse 82, temperature 99.1 F (37.3 C), temperature source Axillary, resp. rate 15, height 5' 4 (1.626 m), weight 66 kg, SpO2 98%.   Exam: Awake/alert Not in distress Left thigh appears unchanged.  Pertinent Labs/Radiology:    Latest Ref Rng & Units 04/15/2024    4:56 AM 04/14/2024   12:36 PM 04/14/2024    5:49 AM  CBC  WBC 4.0 - 10.5 K/uL 11.1  14.3  14.6   Hemoglobin 12.0 - 15.0 g/dL 7.8  8.0  8.1   Hematocrit 36.0 - 46.0 % 24.0  24.0  25.0   Platelets 150 - 400 K/uL 214  211  191     Lab Results  Component Value Date   NA 133 (L) 04/15/2024   K 3.3 (L) 04/15/2024   CL 99 04/15/2024   CO2 26 04/15/2024    Assessment/Plan: Left rectus muscle hematoma/Left thigh hematoma with acute blood loss anemia Unclear etiology-denies obvious trauma-not on anticoagulation prior to this hospitalization.  Coag/platelet count stable-no prior history of epistaxis/spontaneous hematomas/bleeding history. S/p 2 units of PRBC on 7/1-Hb stable since then-heparin  infusion was started on 7/7-subsequently Hb started trending down-heparin  was discontinued on 7/8.  Slight drop in hemoglobin today but no obvious bleeding apparent-continue to monitor closely. Patient will likely benefit from outpatient hematology evaluation for-May need further workup for unexplained left rectus muscle and left thigh hematoma.  Acute bilateral PE with left lower extremity DVT Not a candidate for anticoagulation S/p IVC filter placement by IR on 7/01 Probably provoked VTE in the setting left thigh hematoma Since Hb stable for a week-with no bleeding/worsening swelling-after risk/benefits discussed  with patient on 7/7-she was started on IV heparin -unfortunately-Hb decreased down to 8.0 on 7/8-although no overt bleeding was apparent-heparin  was  discontinued.  Suspect prudent not to rechallenge her anymore at this point-would give it another week or so before starting anticoagulation.  This presumably could be [facility] at [facility].  AKI on CKD stage IIIb AKI initially felt to be hemodynamically mediated-had improved-but on 7/3-significant worsening-suspicion for contrast-induced nephropathy  (CTA study on 6/30-and IVC filter on 7/1).   Creatinine slowly improving-urine output stable-will attempt to discontinue Foley catheter today. Had discussed with nephrology-Dr. Dolan on 7/4-since renal function improving-will hold off on formal consultation at this point  Leukocytosis No obvious foci of infection Probably reactive-?  Inflammation around hematoma site. Monitor off antibiotics.  Hyperkalemia Secondary to worsening renal function Resolved with Lokelma  and IVF with bicarb.  Nausea/vomiting Resolved No apparent cause noted on CT imaging Supportive care with antiemetics  Constipation Stable-Had multiple BMs every other day Will keep on maintenance MiraLAX /senna for now.  PAD Per vascular surgery-has patent vasculature bilaterally No intervention recommended Unable to use aspirin Follow-up with vascular surgery in the outpatient setting  HTN BP was soft for the past several days-but now slowly creeping up AM cortisol stable BP stable on midodrine .  History of peripheral neuropathy/severe arthritis Chronic debility/deconditioning Supportive care PT/OT eval when able  Code status:   Code Status: Limited: Do not attempt resuscitation (DNR) -DNR-LIMITED -Do Not Intubate/DNI    DVT Prophylaxis: SCDs if possible-anticoagulation contraindicated due to left rectus muscle/left thigh hematoma.   Family Communication: Niece-Brenda [phone] updated over the phone 7/5   Disposition Plan: Status is: Inpatient Remains inpatient appropriate because: Severity of illness   Planned Discharge Destination:Skilled nursing  facility   Diet: Diet Order             Diet renal with fluid restriction Fluid restriction: 1200 mL Fluid; Room service appropriate? Yes; Fluid consistency: Thin  Diet effective now                     Antimicrobial agents: Anti-infectives (From admission, onward)    None        MEDICATIONS: Scheduled Meds:  Chlorhexidine  Gluconate Cloth  6 each Topical Daily   midodrine   7.5 mg Oral TID WC   pantoprazole   40 mg Oral Daily   polyethylene glycol  17 g Oral Daily   senna-docusate  2 tablet Oral BID   sodium chloride  flush  3 mL Intravenous Q12H   Continuous Infusions:    PRN Meds:.acetaminophen  **OR** acetaminophen , albuterol , dextrose , LORazepam , ondansetron  (ZOFRAN ) IV, prochlorperazine , traMADol    I have personally reviewed following labs and imaging studies  LABORATORY DATA: CBC: Recent Labs  Lab 04/11/24 0854 04/13/24 0520 04/14/24 0549 04/14/24 1236 04/15/24 0456  WBC 12.2* 18.2* 14.6* 14.3* 11.1*  HGB 9.6* 9.5* 8.1* 8.0* 7.8*  HCT 28.8* 28.9* 25.0* 24.0* 24.0*  MCV 91.7 92.3 92.3 89.6 92.7  PLT 133* 165 191 211 214    Basic Metabolic Panel: Recent Labs  Lab 04/11/24 0429 04/12/24 0424 04/13/24 0520 04/14/24 0549 04/15/24 0456  NA 137 135 136 136 133*  K 4.1 3.8 4.1 3.8 3.3*  CL 99 97* 98 101 99  CO2 27 27 24 26 26   GLUCOSE 127* 103* 114* 95 103*  BUN 56* 57* 52* 52* 51*  CREATININE 3.40* 3.08* 2.68* 2.31* 2.17*  CALCIUM  7.4* 7.4* 7.8* 7.7* 7.3*  PHOS 5.5*  --   --   --   --  GFR: Estimated Creatinine Clearance: 17.1 mL/min (A) (by C-G formula based on SCr of 2.17 mg/dL (H)).  Liver Function Tests: Recent Labs  Lab 04/11/24 0429  ALBUMIN 2.3*   No results for input(s): LIPASE, AMYLASE in the last 168 hours.  No results for input(s): AMMONIA in the last 168 hours.  Coagulation Profile: No results for input(s): INR, PROTIME in the last 168 hours.   Cardiac Enzymes: Recent Labs  Lab 04/09/24 1120   CKTOTAL 111    BNP (last 3 results) No results for input(s): PROBNP in the last 8760 hours.  Lipid Profile: No results for input(s): CHOL, HDL, LDLCALC, TRIG, CHOLHDL, LDLDIRECT in the last 72 hours.  Thyroid  Function Tests: No results for input(s): TSH, T4TOTAL, FREET4, T3FREE, THYROIDAB in the last 72 hours.  Anemia Panel: No results for input(s): VITAMINB12, FOLATE, FERRITIN, TIBC, IRON, RETICCTPCT in the last 72 hours.  Urine analysis:    Component Value Date/Time   COLORURINE AMBER (A) 04/10/2024 1219   APPEARANCEUR CLOUDY (A) 04/10/2024 1219   LABSPEC 1.024 04/10/2024 1219   PHURINE 5.0 04/10/2024 1219   GLUCOSEU NEGATIVE 04/10/2024 1219   HGBUR SMALL (A) 04/10/2024 1219   BILIRUBINUR NEGATIVE 04/10/2024 1219   KETONESUR NEGATIVE 04/10/2024 1219   PROTEINUR NEGATIVE 04/10/2024 1219   UROBILINOGEN 0.2 09/11/2011 0927   NITRITE NEGATIVE 04/10/2024 1219   LEUKOCYTESUR SMALL (A) 04/10/2024 1219    Sepsis Labs: Lactic Acid, Venous    Component Value Date/Time   LATICACIDVEN 3.3 (HH) 04/06/2024 2014    MICROBIOLOGY: No results found for this or any previous visit (from the past 240 hours).  RADIOLOGY STUDIES/RESULTS: No results found.    LOS: 9 days   Donalda Applebaum, MD  [facility]    To contact the attending provider between 7A-7P or the covering provider during after hours 7P-7A, please log into the web site [email or web address] and access using universal [place] password for that web site. If you do not have the password, please call the hospital operator.  04/15/2024, 11:15 AM

## 2024-04-15 NOTE — Plan of Care (Signed)
  Problem: Metabolic: Goal: Ability to maintain appropriate glucose levels will improve Outcome: Progressing   Problem: Nutritional: Goal: Maintenance of adequate nutrition will improve Outcome: Progressing   Problem: Skin Integrity: Goal: Risk for impaired skin integrity will decrease Outcome: Progressing   

## 2024-04-15 NOTE — Progress Notes (Signed)
 Occupational Therapy Treatment Patient Details Name: LORIE CLECKLEY MRN: [id number] DOB: 1935/08/14 Today's Date: 04/15/2024   History of present illness 89 y.o. female presents to [facility] 04/06/24 with LLE pain/numbness. Pt with hematoma of L thigh and rectus femoris. Pt also with B acute PE and acute LLE DVT. 7/1 IVC filter. PMHx: hypertension, hypothyroidism, gout, GERD, CKD   OT comments  Attempted to see Pt 2X today, too tired in morning, and continues to have fatigue. Pt states she got up to [facility] with nursing and has no more energy. Assisted Pt with feeding as she is unable to reach table or pull it toward her, needs help with cutting chicken and placing food within reach as she has limited B shoulder ROM. Pt able to use R hand with fork to complete self feeding. Pt not able to open food containers well due to weak grip. Pt not agreeable to OOB activity, will continue to see acutely to maximize activity tolerance and ADL participation. DC to postacute rehab <3hrs/day still recommended.       If plan is discharge home, recommend the following:  A lot of help with walking and/or transfers;A little help with bathing/dressing/bathroom;Assistance with cooking/housework;Assist for transportation;Help with stairs or ramp for entrance   Equipment Recommendations  Other (comment)    Recommendations for Other Services      Precautions / Restrictions Precautions Precautions: Fall Recall of Precautions/Restrictions: Intact Precaution/Restrictions Comments: LLE hematoma Restrictions Weight Bearing Restrictions Per Provider Order: No       Mobility Bed Mobility Overal bed mobility: Needs Assistance Bed Mobility: Rolling Rolling: Min assist         General bed mobility comments: min A for rolling L/R, needs help for scooting back in bed    Transfers Overall transfer level: Needs assistance                 General transfer comment: declined     Balance                                            ADL either performed or assessed with clinical judgement   ADL Overall ADL's : Needs assistance/impaired Eating/Feeding: Minimal assistance   Grooming: Minimal assistance;Sitting                                 General ADL Comments: Assisted with feeding, Pt unable to cut food, poor grip strength and decreased shoulder ROM to reach plate. Once set up Pt able to use fork to self feed    Extremity/Trunk Assessment Upper Extremity Assessment RUE Deficits / Details: Pt has history of R shoudler injury, poor AROM at shoulder, weak grip, poor FM LUE Deficits / Details: overall weak grip, poor FM            Vision       Perception     Praxis     Communication Communication Communication: Impaired Factors Affecting Communication: Hearing impaired   Cognition Arousal: Alert Behavior During Therapy: WFL for tasks assessed/performed Cognition: No apparent impairments                               Following commands: Intact        Cueing   Cueing Techniques: Verbal cues, Tactile  cues  Exercises      Shoulder Instructions       General Comments      Pertinent Vitals/ Pain       Pain Assessment Pain Assessment: Faces Faces Pain Scale: Hurts a little bit Pain Location: generalized Pain Descriptors / Indicators: Discomfort, Grimacing Pain Intervention(s): Monitored during session  Home Living                                          Prior Functioning/Environment              Frequency  Min 2X/week        Progress Toward Goals  OT Goals(current goals can now be found in the care plan section)  Progress towards OT goals: Progressing toward goals  Acute Rehab OT Goals Patient Stated Goal: to improve activity tolerance OT Goal Formulation: With patient Time For Goal Achievement: 04/22/24 Potential to Achieve Goals: Good ADL Goals Pt Will Perform Upper Body Dressing: with  set-up;with supervision Pt Will Perform Lower Body Dressing: with set-up;with supervision Pt Will Transfer to Toilet: with supervision;ambulating Pt Will Perform Toileting - Clothing Manipulation and hygiene: with set-up;with supervision  Plan      Co-evaluation                 AM-PAC OT 6 Clicks Daily Activity     Outcome Measure   Help from another person eating meals?: A Little Help from another person taking care of personal grooming?: A Little Help from another person toileting, which includes using toliet, bedpan, or urinal?: A Lot Help from another person bathing (including washing, rinsing, drying)?: A Lot Help from another person to put on and taking off regular upper body clothing?: A Little Help from another person to put on and taking off regular lower body clothing?: A Lot 6 Click Score: 15    End of Session    OT Visit Diagnosis: Unsteadiness on feet (R26.81);Other abnormalities of gait and mobility (R26.89);Muscle weakness (generalized) (M62.81);Pain Pain - Right/Left: Left Pain - part of body: Leg   Activity Tolerance Patient limited by fatigue   Patient Left in bed;with call bell/phone within reach   Nurse Communication Mobility status        Time: 1350-1401 OT Time Calculation (min): 11 min  Charges: OT General Charges $OT Visit: 1 Visit OT Treatments $Self Care/Home Management : 8-22 mins  [place], OTR/L   Averyanna Sax R Huxley Shurley 04/15/2024, 3:01 PM

## 2024-04-16 DIAGNOSIS — S301XXA Contusion of abdominal wall, initial encounter: Secondary | ICD-10-CM | POA: Diagnosis not present

## 2024-04-16 DIAGNOSIS — D72829 Elevated white blood cell count, unspecified: Secondary | ICD-10-CM | POA: Diagnosis not present

## 2024-04-16 DIAGNOSIS — I2699 Other pulmonary embolism without acute cor pulmonale: Secondary | ICD-10-CM | POA: Diagnosis not present

## 2024-04-16 LAB — GLUCOSE, CAPILLARY
Glucose-Capillary: 109 mg/dL — ABNORMAL HIGH (ref 70–99)
Glucose-Capillary: 101 mg/dL — ABNORMAL HIGH (ref 70–99)
Glucose-Capillary: 101 mg/dL — ABNORMAL HIGH (ref 70–99)
Glucose-Capillary: 85 mg/dL (ref 70–99)

## 2024-04-16 LAB — CBC
HCT: 25.1 % — ABNORMAL LOW (ref 36.0–46.0)
Hemoglobin: 8.1 g/dL — ABNORMAL LOW (ref 12.0–15.0)
MCH: 29.6 pg (ref 26.0–34.0)
MCHC: 32.3 g/dL (ref 30.0–36.0)
MCV: 91.6 fL (ref 80.0–100.0)
Platelets: 255 K/uL (ref 150–400)
RBC: 2.74 MIL/uL — ABNORMAL LOW (ref 3.87–5.11)
RDW: 15 % (ref 11.5–15.5)
WBC: 9.6 K/uL (ref 4.0–10.5)
nRBC: 0 % (ref 0.0–0.2)

## 2024-04-16 LAB — BASIC METABOLIC PANEL WITH GFR
Anion gap: 10 (ref 5–15)
BUN: 48 mg/dL — ABNORMAL HIGH (ref 8–23)
CO2: 26 mmol/L (ref 22–32)
Calcium: 8 mg/dL — ABNORMAL LOW (ref 8.9–10.3)
Chloride: 100 mmol/L (ref 98–111)
Creatinine, Ser: 1.96 mg/dL — ABNORMAL HIGH (ref 0.44–1.00)
GFR, Estimated: 24 mL/min — ABNORMAL LOW (ref >60)
Glucose, Bld: 96 mg/dL (ref 70–99)
Potassium: 3.8 mmol/L (ref 3.5–5.1)
Sodium: 136 mmol/L (ref 135–145)

## 2024-04-16 MED ORDER — LORAZEPAM 1 MG PO TABS: 1.0000 mg | ORAL_TABLET | Freq: Three times a day (TID) | ORAL | 0 refills | Status: DC | PRN

## 2024-04-16 MED ORDER — PANTOPRAZOLE SODIUM 40 MG PO TBEC: 40.0000 mg | DELAYED_RELEASE_TABLET | Freq: Every day | ORAL | Status: DC

## 2024-04-16 MED ORDER — TRAMADOL HCL 50 MG PO TABS: 50.0000 mg | ORAL_TABLET | Freq: Four times a day (QID) | ORAL | 0 refills | Status: DC | PRN

## 2024-04-16 MED ORDER — POLYETHYLENE GLYCOL 3350 17 G PO PACK: 17.0000 g | PACK | Freq: Every day | ORAL | Status: DC

## 2024-04-16 MED ORDER — SENNOSIDES-DOCUSATE SODIUM 8.6-50 MG PO TABS: 2.0000 | ORAL_TABLET | Freq: Two times a day (BID) | ORAL | Status: DC

## 2024-04-16 MED ORDER — MIDODRINE HCL 2.5 MG PO TABS: 7.5000 mg | ORAL_TABLET | Freq: Three times a day (TID) | ORAL | Status: DC

## 2024-04-16 NOTE — Plan of Care (Signed)

## 2024-04-16 NOTE — Discharge Summary (Signed)
 PATIENT DETAILS Name: Julie [name] Age: 89 y.o. Sex: female Date of Birth: 1934/11/16 MRN: [id number]. Admitting Physician: Maximino DELENA Sharps, MD ERE:Rnoopwd, Lonell, DO  Admit Date: 04/06/2024 Discharge date: 04/16/2024  Recommendations for Outpatient Follow-up:  Follow up with PCP in 1-2 weeks Please obtain CMP/CBC in one week Consider initiation of Eliquis in 1 week from day of discharge-if repeat CBC remains stable. Outpatient hematology evaluation Ensure follow-up with vascular surgery and interventional radiology  Admitted From:  Home  Disposition: Skilled nursing facility   Discharge Condition: good  CODE STATUS:   Code Status: Limited: Do not attempt resuscitation (DNR) -DNR-LIMITED -Do Not Intubate/DNI    Diet recommendation:  Diet Order             Diet - low sodium heart healthy           Diet renal with fluid restriction Fluid restriction: 1200 mL Fluid; Room service appropriate? Yes; Fluid consistency: Thin  Diet effective now                    Brief Summary: Patient is a 89 y.o.  female with history of HTN, hypothyroidism, GERD, advanced arthritis, chronic neuropathy-near bedbound status-who presented with sudden onset of left leg pain that woke her up around 2 am on the day of her presentation-she was evaluated in the ED-due to concern for potential ischemic LLE-she underwent CTA study-which showed rectus sheath/left thigh hematoma as well as pulmonary emboli.  She was briefly hypotensive in the ED but responded to IVF bolus. She was evaluated by PCCM-not felt to require intervention for PE-also evaluated by vascular surgery-thought to have patent vasculature bilaterally.  She was subsequently admitted to the hospitalist service.   Significant events: 6/30>> presented with sudden onset of left leg pain-found to have left rectus muscle/left thigh hematoma and PE. 7/01>> IVC filter placement 7/03>> worsening renal function-CK not elevated-renal  ultrasound but is on exam most without hydronephrosis.  IV fluid started. 7/07>> risk/benefits of anticoagulation discussed with patient-IV heparin  started today 7/08>> Hb down to 8.0 (9.5 on 7/7)-heparin  discontinued.   Significant studies: 6/30>> CTA abdominal aorta with iliofemoral runoff: Partially visible pulmonary emboli, moderate size left rectus muscle hematoma, additional long segment left thigh intramuscular hematoma, advanced femoral/bilateral lower extremity calcified atherosclerosis. 6/30>> TTE: EF> 75% 7/01>> B/L LE Doppler: Acute DVT left femoral/left popliteal/left tibial/left peroneal vein 7/03>> renal ultrasound: No hydronephrosis but numerous renal cysts.   Significant microbiology data: None   Procedures: 7/01>> IVC filter placement (by IR)   Consults: Vascular surgery PCCM IR  Brief Hospital Course: Left rectus muscle hematoma/Left thigh hematoma with acute blood loss anemia Unclear etiology-denies obvious trauma-not on anticoagulation prior to this hospitalization.  Coag/platelet count stable-no prior history of epistaxis/spontaneous hematomas/bleeding history. S/p 2 units of PRBC on 7/1-Hb stable since then-heparin  infusion was started on 7/7-subsequently Hb started trending down (Hb 9.5>>8.0)-heparin  was discontinued on 7/8.  Thankfully Hb stable for the past 2 days-physical exam appears unchanged-left thigh swelling is unchanged. Patient will likely benefit from outpatient hematology evaluation for-May need further workup for unexplained left rectus muscle and left thigh hematoma.   Acute bilateral PE with left lower extremity DVT Not a candidate for anticoagulation currently S/p IVC filter placement by IR on 7/01 Probably provoked VTE in the setting left thigh hematoma Since Hb stable for a week-with no bleeding/worsening swelling-after risk/benefits discussed with patient on 7/7-she was started on IV heparin -unfortunately-Hb decreased down to 8.0 from 9.5 on  7/8-although no  overt bleeding was apparent-heparin  was discontinued.  Suspect prudent not to rechallenge her anymore at this point-would give it another week or so before starting anticoagulation.  Recommend that we check CBC early next week-if stable- MD at SNF  start Eliquis without bolus dosing-with close monitoring of CBC.   AKI on CKD stage IIIb AKI initially felt to be hemodynamically mediated-had improved-but on 7/3-significant worsening-suspicion for contrast-induced nephropathy  (CTA study on 6/30-and IVC filter on 7/1).   Creatinine continues to improve with just supportive care-continue to follow electrolytes. Had discussed with nephrology-Dr. Dolan on 7/4-since renal function improving-will hold off on formal consultation at this point   Leukocytosis No obvious foci of infection Probably reactive-?  Inflammation around hematoma site. Monitored off antibiotics. Leukocytosis has resolved.   Hyperkalemia Secondary to worsening renal function Resolved with Lokelma  and IVF with bicarb.   Nausea/vomiting Resolved No apparent cause noted on CT imaging Supportive care with antiemetics   Constipation Stable-Had multiple BMs every other day Will keep on maintenance MiraLAX /senna for now.   PAD Per vascular surgery-has patent vasculature bilaterally No intervention recommended Unable to use aspirin Follow-up with vascular surgery in the outpatient setting   HTN BP was soft for the past several days-but now slowly creeping up AM cortisol stable BP stable on midodrine .  MD at SNF to slowly titrate down midodrine .   History of peripheral neuropathy/severe arthritis Chronic debility/deconditioning Supportive care PT/OT eval-SNF recommended.  Discharge Diagnoses:  Principal Problem:   Leg hematoma Active Problems:   Rectus sheath hematoma   PVD (peripheral vascular disease) (HCC)   Bilateral pulmonary embolism (HCC)   Lactic acidosis   Leukocytosis   Nausea and  vomiting   Hypocalcemia   Essential hypertension   Chronic kidney disease, stage 3b (HCC)   Discharge Instructions:  Activity:  As tolerated with Full fall precautions use walker/cane & assistance as needed Discharge Instructions     Diet - low sodium heart healthy   Complete by: As directed    Discharge instructions   Complete by: As directed    Follow with Primary MD  Gerome Brunet, DO in 1-2 weeks  Please get a complete blood count and chemistry panel checked by your Primary MD at your next visit, and again as instructed by your Primary MD.  Get Medicines reviewed and adjusted: Please take all your medications with you for your next visit with your Primary MD  Laboratory/radiological data: Please request your Primary MD to go over all hospital tests and procedure/radiological results at the follow up, please ask your Primary MD to get all Hospital records sent to his/her office.  In some cases, they will be blood work, cultures and biopsy results pending at the time of your discharge. Please request that your primary care M.D. follows up on these results.  Also Note the following: If you experience worsening of your admission symptoms, develop shortness of breath, life threatening emergency, suicidal or homicidal thoughts you must seek medical attention immediately by calling 911 or calling your MD immediately  if symptoms less severe.  You must read complete instructions/literature along with all the possible adverse reactions/side effects for all the Medicines you take and that have been prescribed to you. Take any new Medicines after you have completely understood and accpet all the possible adverse reactions/side effects.   Do not drive when taking Pain medications or sleeping medications (Benzodaizepines)  Do not take more than prescribed Pain, Sleep and Anxiety Medications. It is not advisable to combine anxiety,sleep  and pain medications without talking with your primary  care practitioner  Special Instructions: If you have smoked or chewed Tobacco  in the last 2 yrs please stop smoking, stop any regular Alcohol  and or any Recreational drug use.  Wear Seat belts while driving.  Please note: You were cared for by a hospitalist during your hospital stay. Once you are discharged, your primary care physician will handle any further medical issues. Please note that NO REFILLS for any discharge medications will be authorized once you are discharged, as it is imperative that you return to your primary care physician (or establish a relationship with a primary care physician if you do not have one) for your post hospital discharge needs so that they can reassess your need for medications and monitor your lab values.   Increase activity slowly   Complete by: As directed    No wound care   Complete by: As directed       Allergies as of 04/16/2024       Reactions   Ace Inhibitors Swelling, Other (See Comments)   Angioedemia   Advair Hfa [fluticasone-salmeterol] Other (See Comments)   Blurred vision   Amrix [cyclobenzaprine Hcl] Other (See Comments)   Abnormal LFT(s)   Asa [aspirin] Other (See Comments)   Bleeding    Lyrica [pregabalin] Other (See Comments)   Patient concerned for possible side effects   Nsaids Other (See Comments)   Bleeding   Zocor [simvastatin] Other (See Comments)   Myalgias    Adalat [nifedipine] Anxiety        Medication List     STOP taking these medications    amLODipine  10 MG tablet Commonly known as: NORVASC    carvedilol  3.125 MG tablet Commonly known as: COREG        TAKE these medications    colchicine  0.6 MG tablet Take 0.6 mg by mouth daily as needed (gout).   ferrous sulfate  325 (65 FE) MG tablet Take 1 tablet (325 mg total) by mouth daily. What changed:  when to take this additional instructions   LORazepam  1 MG tablet Commonly known as: ATIVAN  Take 1 tablet (1 mg total) by mouth 3 (three) times  daily as needed (vertigo).   magnesium  sulfate Gran Commonly known as: EPSOM SALT Take by mouth See admin instructions. Mix 1 small scoop with water and drink every other day for bowel regimen.   midodrine  2.5 MG tablet Commonly known as: PROAMATINE  Take 3 tablets (7.5 mg total) by mouth 3 (three) times daily with meals.   ondansetron  8 MG tablet Commonly known as: ZOFRAN  Take 8 mg by mouth 2 (two) times daily as needed for nausea or vomiting.   pantoprazole  40 MG tablet Commonly known as: PROTONIX  Take 1 tablet (40 mg total) by mouth daily. Start taking on: April 17, 2024   polyethylene glycol 17 g packet Commonly known as: MIRALAX  / GLYCOLAX  Take 17 g by mouth daily. Start taking on: April 17, 2024   predniSONE 5 MG tablet Commonly known as: DELTASONE Take 5 mg by mouth daily as needed (pain not relieved by tramadol ).   senna-docusate 8.6-50 MG tablet Commonly known as: Senokot-S Take 2 tablets by mouth 2 (two) times daily.   traMADol  50 MG tablet Commonly known as: ULTRAM  Take 1 tablet (50 mg total) by mouth every 6 (six) hours as needed for moderate pain (pain score 4-6).   VITAMIN B-12 PO Take 1 tablet by mouth every other day.   VITAMIN D-3 PO Take  1 capsule by mouth daily.        Contact information for follow-up providers     Gerome Brunet, DO. Schedule an appointment as soon as possible for a visit in 1 week(s).   Specialty: Family Medicine Contact information: [address] [place] [address] [place] [address] [phone]         [facility] at [facility] [employer] [employer] [employer]. Schedule an appointment as soon as possible for a visit in 2 week(s).   Specialty: Vascular Surgery Contact information: [address] [place]  [address] [phone]        [facility] INTERVENTIONAL RADIOLOGY. Schedule an appointment as soon as possible for a visit in 2 week(s).   Specialty:  Radiology Contact information: [address] [place]  [address] [phone]             Contact information for after-discharge care     Destination     [place] [facility] [place], [place] .   Service: Skilled Nursing Contact information: [address] [place]  [address] [phone]                    Allergies  Allergen Reactions   Ace Inhibitors Swelling and Other (See Comments)    Angioedemia   Advair Hfa [Fluticasone-Salmeterol] Other (See Comments)    Blurred vision    Amrix [Cyclobenzaprine Hcl] Other (See Comments)    Abnormal LFT(s)   Asa [Aspirin] Other (See Comments)    Bleeding    Lyrica [Pregabalin] Other (See Comments)    Patient concerned for possible side effects   Nsaids Other (See Comments)    Bleeding   Zocor [Simvastatin] Other (See Comments)    Myalgias    Adalat [Nifedipine] Anxiety     Other Procedures/Studies: US  RENAL Result Date: 04/09/2024 CLINICAL DATA:  Acute kidney injury EXAM: RENAL / URINARY TRACT ULTRASOUND COMPLETE COMPARISON:  CT noncontrast 02/04/2022. CTA 04/06/2024. FINDINGS: Right Kidney: Renal measurements: 11.3 x 5.6 x 9.4 cm = volume: 307.9 mL. No collecting system dilatation. There are numerous anechoic lesions identified in the right kidney consistent with cysts as seen on the prior examination. Largest measures 7.5 x 6.8 x 7.9 cm. Left Kidney: Renal measurements: 9.5 x 6.5 x 6.0 cm = volume: 194.0 mL. No collecting system dilatation. Multiple cystic foci are again identified which appears simple. Largest measures 4.6 cm as seen on the previous examination. Bladder: Appears normal for degree of bladder distention. Other: None. IMPRESSION: Numerous bilateral renal cysts as seen previously. No collecting system dilatation. Electronically Signed   By: Ranell Bring M.D.   On: 04/09/2024 14:19   VAS US  ABI WITH/WO TBI Result Date: 04/08/2024  LOWER EXTREMITY DOPPLER STUDY Patient  Name:  SHAHANA CAPES  Date of Exam:   04/08/2024 Medical Rec #: [id number]      Accession #:    [id number] Date of Birth: [birth year]      Patient Gender: F Patient Age:   71 years Exam Location:  [facility] Procedure:      VAS US  ABI WITH/WO TBI Referring Phys: Norman Serve --------------------------------------------------------------------------------  Indications: Claudication. Severe pain in left leg High Risk Factors: Hypertension. Other Factors: Left leg DVT. IVC filter in place.   Comparison Study: CTA AO-Bifem 04/06/24.                   No prior ABI Performing Technologist: Ricka  Sturdivant-Jones RDMS, RVT  Examination Guidelines: A complete evaluation includes at minimum, Doppler waveform signals and systolic blood pressure reading at the level of bilateral brachial, anterior tibial, and posterior tibial arteries, when vessel segments are accessible. Bilateral testing is considered an integral part of a complete examination. Photoelectric Plethysmograph (PPG) waveforms and toe systolic pressure readings are included as required and additional duplex testing as needed. Limited examinations for reoccurring indications may be performed as noted.  ABI Findings: +---------+------------------+-----+---------+--------+ Right    Rt Pressure (mmHg)IndexWaveform Comment  +---------+------------------+-----+---------+--------+ Brachial                        triphasicNA       +---------+------------------+-----+---------+--------+ PTA      185               1.43 triphasic         +---------+------------------+-----+---------+--------+ DP       254               1.97 triphasic         +---------+------------------+-----+---------+--------+ Burnetta Griffon               1.07 Normal            +---------+------------------+-----+---------+--------+ +---------+------------------+-----+---------+-------+ Left     Lt Pressure (mmHg)IndexWaveform Comment  +---------+------------------+-----+---------+-------+ Brachial 129                    triphasic        +---------+------------------+-----+---------+-------+ PTA      254               1.97 triphasic        +---------+------------------+-----+---------+-------+ DP       254               1.97 triphasic        +---------+------------------+-----+---------+-------+ Great Toe116               0.90 Normal           +---------+------------------+-----+---------+-------+ +-------+-----------+-----------+------------+------------+ ABI/TBIToday's ABIToday's TBIPrevious ABIPrevious TBI +-------+-----------+-----------+------------+------------+ Right  [place] / 1.07                                      +-------+-----------+-----------+------------+------------+ Left   [place] / 0.90                                      +-------+-----------+-----------+------------+------------+  Summary: Right: Resting right ankle-brachial index indicates noncompressible right lower extremity arteries. The right toe-brachial index is normal. Left: Resting left ankle-brachial index indicates noncompressible left lower extremity arteries. The left toe-brachial index is normal. *See table(s) above for measurements and observations.  Electronically signed by Gaile New MD on 04/08/2024 at 1:43:39 PM.    Final    VAS US  LOWER EXTREMITY VENOUS (DVT) Result Date: 04/07/2024  Lower Venous DVT Study Patient Name:  CHANIAH CISSE  Date of Exam:   04/07/2024 Medical Rec #: [id number]      Accession #:    [id number] Date of Birth: [birth year]      Patient Gender: F Patient Age:   66 years Exam [facility]:  [facility] Procedure:      VAS US  LOWER EXTREMITY VENOUS (DVT) Referring Phys: TORIBIO SHARPS --------------------------------------------------------------------------------  Indications: Onset of severe pain in left leg  and numbness and trouble moving her leg.  Comparison Study: No priors Performing Technologist:  Delon Fetch  Examination Guidelines: A complete evaluation includes B-mode imaging, spectral Doppler, color Doppler, and power Doppler as needed of all accessible portions of each vessel. Bilateral testing is considered an integral part of a complete examination. Limited examinations for reoccurring indications may be performed as noted. The reflux portion of the exam is performed with the patient in reverse Trendelenburg.  +---------+---------------+---------+-----------+----------+--------------+ RIGHT    CompressibilityPhasicitySpontaneityPropertiesThrombus Aging +---------+---------------+---------+-----------+----------+--------------+ CFV      Full           Yes      Yes                                 +---------+---------------+---------+-----------+----------+--------------+ SFJ      Full                                                        +---------+---------------+---------+-----------+----------+--------------+ FV Prox  Full                                                        +---------+---------------+---------+-----------+----------+--------------+ FV Mid   Full                                                        +---------+---------------+---------+-----------+----------+--------------+ FV DistalFull                                                        +---------+---------------+---------+-----------+----------+--------------+ PFV      Full                                                        +---------+---------------+---------+-----------+----------+--------------+ POP      Full           Yes      Yes                                 +---------+---------------+---------+-----------+----------+--------------+ PTV      Full                                                        +---------+---------------+---------+-----------+----------+--------------+ PERO     Full                                                         +---------+---------------+---------+-----------+----------+--------------+   +---------+---------------+---------+-----------+----------+--------------+  LEFT     CompressibilityPhasicitySpontaneityPropertiesThrombus Aging +---------+---------------+---------+-----------+----------+--------------+ CFV      Full           Yes      Yes                                 +---------+---------------+---------+-----------+----------+--------------+ SFJ      Full                                                        +---------+---------------+---------+-----------+----------+--------------+ FV Prox  Full                                                        +---------+---------------+---------+-----------+----------+--------------+ FV Mid   Full                                                        +---------+---------------+---------+-----------+----------+--------------+ FV DistalPartial        Yes      Yes                  Acute          +---------+---------------+---------+-----------+----------+--------------+ PFV      Full                                                        +---------+---------------+---------+-----------+----------+--------------+ POP      None           No       No                   Acute          +---------+---------------+---------+-----------+----------+--------------+ PTV      Partial                                      Acute          +---------+---------------+---------+-----------+----------+--------------+ PERO     None                                         Acute          +---------+---------------+---------+-----------+----------+--------------+     Summary: RIGHT: - There is no evidence of deep vein thrombosis in the lower extremity.  - No cystic structure found in the popliteal fossa.  LEFT: - Findings consistent with acute deep vein thrombosis involving the left femoral vein, left popliteal vein, left posterior  tibial veins, and left peroneal veins.   *See table(s) above for measurements and observations. Electronically signed by Debby Robertson on 04/07/2024 at 9:19:26 PM.  Final    IR IVC FILTER PLMT / S&I PORTER GUID/MOD SED Result Date: 04/07/2024 INDICATION: Lower extremity DVT, PE, multiple intramuscular hemorrhages in a patient with an inability to anticoagulate EXAM: IVC filter placement MEDICATIONS: None. ANESTHESIA/SEDATION: Moderate (conscious) sedation was employed during this procedure. A total of Versed  0 mg and Fentanyl  0 mcg was administered intravenously by the radiology nurse. Total intra-service moderate Sedation Time: 0 minutes. The patient's level of consciousness and vital signs were monitored continuously by radiology nursing throughout the procedure under my direct supervision. FLUOROSCOPY: Radiation Exposure Index (as provided by the fluoroscopic device): 49.4 mGy Kerma COMPLICATIONS: None immediate. PROCEDURE: Informed written consent was obtained from the patient after a thorough discussion of the procedural risks, benefits and alternatives. All questions were addressed. Maximal Sterile Barrier Technique was utilized including caps, mask, sterile gowns, sterile gloves, sterile drape, hand hygiene and skin antiseptic. A timeout was performed prior to the initiation of the procedure. In a supine position, the right neck was prepped and draped in usual sterile fashion. Local anesthesia was achieved by infiltrating subcutaneous tissue overlying the right internal jugular vein with 1% lidocaine . A small incision was created overlying the right internal jugular vein. Ultrasound demonstrated the right internal jugular vein to be anechoic and compressible the [place] AT. The needle was advanced through the incision to the midpoint of the right internal jugular vein lumen. Final image was obtained of needle position after the needle had been advanced under ultrasound guidance. Image placed in patient's  permanent medical record. Access was exchanged for a 035 guidewire. Under fluoroscopic guidance guidewire was advanced to the IVC. IVC deployment sheath was then advanced over the guidewire and placed in the distal IVC. Guidewire and introducer were then removed. Using the sheath, contrast was injected for an IVC venogram. Renal veins identified. The [facility] filter was prepped the table advanced through the sheath as the sheath was withdrawn there by deploying the [facility] filter. Minimal tilting present however overall positioning satisfactory. The sheath was then retrieved and hemostasis was achieved. IMPRESSION: Satisfactory placement of a Denali IVC filter. The patient will be followed up in approximately 3 months for determination of retrieval. PLAN: This IVC filter is potentially retrievable. The patient will be assessed for filter retrieval by Interventional Radiology in approximately 8-12 weeks. Further recommendations regarding filter retrieval, continued surveillance or declaration of device permanence, will be made at that time. Electronically Signed   By: Cordella Banner   On: 04/07/2024 17:35   ECHOCARDIOGRAM COMPLETE Result Date: 04/06/2024    ECHOCARDIOGRAM REPORT   Patient Name:   KORTLYN KOLTZ Date of Exam: 04/06/2024 Medical Rec #:  [id number]     Height:       64.0 in Accession #:    [id number]    Weight:       145.5 lb Date of Birth:  1935-01-14     BSA:          1.709 m Patient Age:    89 years      BP:           111/68 mmHg Patient Gender: F             HR:           88 bpm. Exam Location:  Inpatient Procedure: 2D Echo, Color Doppler and Cardiac Doppler (Both Spectral and Color            Flow Doppler were utilized during procedure). STAT ECHO Indications:    I26.02  Pulmonary embolus  History:        Patient has no prior history of Echocardiogram examinations.                 Risk Factors:Diabetes and Hypertension.  Sonographer:    Damien Senior RDCS Referring Phys: [id number] TORIBIO JAYSON SHARPS   Sonographer Comments: Technically difficult due to poor echo windows. IMPRESSIONS  1. Left ventricular ejection fraction, by estimation, is >75%. The left ventricle has hyperdynamic function. The left ventricle has no regional wall motion abnormalities. Left ventricular diastolic parameters were normal.  2. The right ventricle is not well visualized. Consider repeat study with contrast. On most views, RV function appears mildly reduced consistent with normal IVC caliber and S'; however, on image 54, RV appears dilated with at least moderately reduced systolic function.  3. The mitral valve is grossly normal. No evidence of mitral valve regurgitation. No evidence of mitral stenosis.  4. The aortic valve is normal in structure. Aortic valve regurgitation is trivial. No aortic stenosis is present.  5. The inferior vena cava is normal in size with greater than 50% respiratory variability, suggesting right atrial pressure of 3 mmHg. FINDINGS  Left Ventricle: Left ventricular ejection fraction, by estimation, is >75%. The left ventricle has hyperdynamic function. The left ventricle has no regional wall motion abnormalities. The left ventricular internal cavity size was normal in size. There is no left ventricular hypertrophy. Left ventricular diastolic parameters were normal. Right Ventricle: The right ventricular size is not well visualized. Right vetricular wall thickness was not well visualized. Right ventricular systolic function was not well visualized. Left Atrium: Left atrial size was normal in size. Right Atrium: Right atrial size was normal in size. Pericardium: There is no evidence of pericardial effusion. Mitral Valve: The mitral valve is grossly normal. No evidence of mitral valve regurgitation. No evidence of mitral valve stenosis. Tricuspid Valve: The tricuspid valve is normal in structure. Tricuspid valve regurgitation is not demonstrated. No evidence of tricuspid stenosis. Aortic Valve: The aortic valve is  normal in structure. Aortic valve regurgitation is trivial. No aortic stenosis is present. Pulmonic Valve: The pulmonic valve was normal in structure. Pulmonic valve regurgitation is not visualized. No evidence of pulmonic stenosis. Aorta: The aortic root is normal in size and structure. Venous: The inferior vena cava is normal in size with greater than 50% respiratory variability, suggesting right atrial pressure of 3 mmHg. IAS/Shunts: No atrial level shunt detected by color flow Doppler.   Diastology LV e' medial:    5.98 cm/s LV E/e' medial:  8.1 LV e' lateral:   10.80 cm/s LV E/e' lateral: 4.5  RIGHT VENTRICLE RV S prime:     20.50 cm/s TAPSE (M-mode): 1.3 cm LEFT ATRIUM             Index        RIGHT ATRIUM          Index LA Vol (A2C):   26.1 ml 15.27 ml/m  RA Area:     9.48 cm LA Vol (A4C):   28.8 ml 16.85 ml/m  RA Volume:   22.00 ml 12.87 ml/m LA Biplane Vol: 28.3 ml 16.56 ml/m  AORTIC VALVE LVOT Vmax:   91.00 cm/s LVOT Vmean:  66.200 cm/s LVOT VTI:    0.165 m MITRAL VALVE MV Area (PHT): 3.30 cm    SHUNTS MV Decel Time: 230 msec    Systemic VTI: 0.16 m MV E velocity: 48.70 cm/s MV A velocity: 94.90 cm/s MV E/A ratio:  0.51 Aditya Sabharwal Electronically signed by Ria Commander Signature Date/Time: 04/06/2024/9:25:07 AM    Final    CT Angio Aortobifemoral W and/or Wo Contrast Addendum Date: 04/06/2024 ADDENDUM REPORT: 04/06/2024 06:17 ADDENDUM: Study discussed by telephone with Dr. LONNI SEATS on 04/06/2024 at 0608 hours. Electronically Signed   By: VEAR Hurst M.D.   On: 04/06/2024 06:17   Result Date: 04/06/2024 CLINICAL DATA:  89 year old female with claudication, limb ischemia. EXAM: CT ANGIOGRAPHY OF ABDOMINAL AORTA WITH ILIOFEMORAL RUNOFF TECHNIQUE: Multidetector CT imaging of the abdomen, pelvis and lower extremities was performed using the standard protocol during bolus administration of intravenous contrast. Multiplanar CT image reconstructions and MIPs were obtained to evaluate the  vascular anatomy. RADIATION DOSE REDUCTION: This exam was performed according to the departmental dose-optimization program which includes automated exposure control, adjustment of the mA and/or kV according to patient size and/or use of iterative reconstruction technique. CONTRAST:  OMNIPAQUE  IOHEXOL  350 MG/ML SOLN COMPARISON:  Noncontrast CT Abdomen and Pelvis 02/04/2022. FINDINGS: VASCULAR Aorta: Tortuous visible descending and abdominal aorta. Negative for abdominal aortic aneurysm or dissection. Calcified aortic atherosclerosis. Celiac: Patent, no significant atherosclerosis. SMA: Patent, minimal atherosclerosis. Renals: Patent, minimal atherosclerosis. IMA: Patent, with calcified plaque at its origin. Pelvic arteries: Tortuous bilateral iliac arteries with calcified atherosclerosis. Bilateral iliacs remain patent. No iliac artery aneurysm. Significantly delayed contrast bolus, inadequate femoral and lower extremity arterial contrast timing on the initial imaging phase at 0525 hours. Imaging was repeated from the mid thighs distally at 0527 hours with adequate contrast timing. RIGHT Lower Extremity Femoral: Calcified Common femoral atherosclerosis. Patent right femoral bifurcation without stenosis. Calcified SFA and PFA. Calcification throughout the right SFA is felt to simulate the appearance of vascular stenting. Inadequate contrast opacification of the distal SFA on the initial images. On the 2nd imaging phase the distal SFA and popliteal arteries are patent. Popliteal artery soft and calcified plaque. Runoff: Three-vessel runoff with extensive calcified atherosclerosis affecting all vessels. Anterior tibial and posterior tibial arteries remain patent to the foot. LEFT Lower Extremity Femoral: Patent Common femoral artery with calcified atherosclerosis. Similar extensive left SFA and PFA calcified plaque, simulating the appearance of SFA vascular stent. Insufficient contrast timing of the distal SFA.  On the 2nd imaging phase the distal SFA and popliteal arteries are patent. Left knee arthroplasty streak artifact obscures portions of the popliteal artery. But it remains patent. Runoff: Heavily calcified three-vessel runoff. The anterior tibial artery is occluded about 3-4 cm distal to its origin on series 13, image 94. There is also poor enhancement of the right posterior tibial artery, in which density appears more related to vessel calcification than preserved enhancement. The fibular artery is occluded on series 13, image 146 in its distal 3rd. Subsequently, there is little to no distal left lower extremity runoff enhancement. Review of the MIP images confirms the above findings. NON-VASCULAR Lower chest: Normal heart size, no pericardial effusion. Left lower lobe pulmonary embolus on series 5, image 6. Nearby posterior basal segment right lower lobe confluent lung opacity and small volume pleural effusion. Contralateral right middle or upper lobe pulmonary artery embolism also faintly visible on series 5, image 1. Visible main pulmonary artery, right main arteries are patent. Visible lung bases otherwise are clear.  No right pleural effusion. Hepatobiliary: Extensive layering gravel type gallstones. No pericholecystic inflammation. Small but circumscribed hypodense areas scattered in the liver compatible with benign etiology such as cyst or hemangioma and stable since 2023 (no follow up imaging recommended). Pancreas: Partially atrophied. Spleen:  Diminutive, negative. Adrenals/Urinary Tract: Chronic bilateral renal cysts including a large exophytic right upper pole cyst with simple fluid density (no follow-up imaging recommended). Otherwise symmetric renal parenchymal enhancement. No hydronephrosis or hydroureter is evident. Mass effect on the urinary bladder from space of Retzius, left anterior pelvic hematoma which is further detailed below. Punctate pelvic phleboliths. Stomach/Bowel: Moderate size stool  ball in the rectum. Upstream large bowel decompressed. Descending and sigmoid diverticulosis with no active inflammation. Decompressed cecum, normal appendix on series 5, image 97. No dilated small bowel. Small to moderate volume of retained fluid and gas in the stomach. No pneumoperitoneum. No free fluid. Lymphatic: No lymphadenopathy identified. Reproductive: Negative. Other: No pelvis free fluid. Musculoskeletal: Chronic severe lumbar spine disc and endplate degeneration, widespread chronic vacuum disc and endplate sclerosis. Lower thoracic flowing endplate osteophytes resulting in interbody ankylosis. No acute osseous abnormality identified in the abdomen or pelvis. But there is a rectus muscle hematoma on the left, tracking along a distance of 10 cm as seen on series 8, image 104. The left rectus muscle is expanded up to 40 x 50 x 100 mm. And there is anterior pelvic, left space of Retzius hematoma extension on series 5, image 118. Abnormal left thigh anterior compartment muscle enlargement and heterogeneity favored to be indistinct intramuscular hematoma (series 5, image 174) which tracks along a distance of at least 24 cm as seen on series 7, image 61. Smaller volume posterior muscle compartment intramuscular hematoma suspected such as on series 5, image 215. The underlying left femur appears to remain intact. No soft tissue gas. Left knee arthroplasty with streak artifact. Bilateral foot degenerative osseous changes. Bilateral great toe metallic hardware. No acute osseous abnormality identified. IMPRESSION: 1. Partially visible bilateral Acute Pulmonary Emboli. Visible clot most apparent in the left lower lobe medial basal segmental branch with evidence of a small associated lower lobe infarct, small left pleural effusion. 2. Moderate sized left lower Rectus Muscle Hematoma. Associated hematoma extension into the anterior left pelvic space, left space of Retzius. Mass effect on the urinary bladder. 3.  Additional long segment Left Thigh Intramuscular Hematoma, anterior more so than posterior muscle compartment involvement. Superimposed left knee arthroplasty. No acute osseous abnormality identified. 4. Advanced femoral artery and bilateral lower extremity calcified atherosclerosis with Poor Left lower extremity runoff; occluded proximal left anterior tibial artery, occluded distal left fibular artery, and poor enhancement throughout the left posterior tibial. 5. Maintained contralateral right lower extremity 3 vessel runoff, with extensive calcified atherosclerosis. 6. Tortuous abdominal aorta and iliac arteries with Aortoiliac calcified atherosclerosis. Negative for abdominal aortic aneurysm or dissection. Aortic Atherosclerosis (ICD10-I70.0). Electronically Signed: By: VEAR Hurst M.D. On: 04/06/2024 06:01     TODAY-DAY OF DISCHARGE:  Subjective:   Makaylen Thieme today has no headache,no chest abdominal pain,no new weakness tingling or numbness, feels much better wants to go home today.   Objective:   Blood pressure 100/66, pulse 73, temperature 99 F (37.2 C), temperature source Oral, resp. rate 16, height 5' 4 (1.626 m), weight 66 kg, SpO2 98%.  Intake/Output Summary (Last 24 hours) at 04/16/2024 1224 Last data filed at 04/16/2024 0650 Gross per 24 hour  Intake --  Output 500 ml  Net -500 ml   Filed Weights   04/06/24 0356  Weight: 66 kg    Exam: Awake Alert, Oriented *3, No new F.N deficits, Normal affect [place].AT,PERRAL Supple Neck,No JVD, No cervical lymphadenopathy appriciated.  Symmetrical Chest wall movement, Good air movement bilaterally, CTAB RRR,No Gallops,Rubs or new  Murmurs, No Parasternal Heave +ve B.Sounds, Abd Soft, Non tender, No organomegaly appriciated, No rebound -guarding or rigidity. No Cyanosis, Clubbing or edema, No new Rash or bruise   PERTINENT RADIOLOGIC STUDIES: No results found.   PERTINENT LAB RESULTS: CBC: Recent Labs    04/15/24 0456  04/16/24 0633  WBC 11.1* 9.6  HGB 7.8* 8.1*  HCT 24.0* 25.1*  PLT 214 255   CMET CMP     Component Value Date/Time   NA 136 04/16/2024 0633   K 3.8 04/16/2024 0633   CL 100 04/16/2024 0633   CO2 26 04/16/2024 0633   GLUCOSE 96 04/16/2024 0633   BUN 48 (H) 04/16/2024 0633   CREATININE 1.96 (H) 04/16/2024 0633   CALCIUM  8.0 (L) 04/16/2024 0633   PROT 6.9 06/08/2022 1134   ALBUMIN 2.3 (L) 04/11/2024 0429   AST 16 06/08/2022 1134   ALT 10 06/08/2022 1134   ALKPHOS 87 06/08/2022 1134   BILITOT 0.5 06/08/2022 1134   GFR 37.17 (L) 06/08/2022 1134   GFRNONAA 24 (L) 04/16/2024 0633    GFR Estimated Creatinine Clearance: 18.9 mL/min (A) (by C-G formula based on SCr of 1.96 mg/dL (H)). No results for input(s): LIPASE, AMYLASE in the last 72 hours. No results for input(s): CKTOTAL, CKMB, CKMBINDEX, TROPONINI in the last 72 hours. Invalid input(s): POCBNP No results for input(s): DDIMER in the last 72 hours. No results for input(s): HGBA1C in the last 72 hours. No results for input(s): CHOL, HDL, LDLCALC, TRIG, CHOLHDL, LDLDIRECT in the last 72 hours. No results for input(s): TSH, T4TOTAL, T3FREE, THYROIDAB in the last 72 hours.  Invalid input(s): FREET3 No results for input(s): VITAMINB12, FOLATE, FERRITIN, TIBC, IRON, RETICCTPCT in the last 72 hours. Coags: No results for input(s): INR in the last 72 hours.  Invalid input(s): PT Microbiology: No results found for this or any previous visit (from the past 240 hours).  FURTHER DISCHARGE INSTRUCTIONS:  Get Medicines reviewed and adjusted: Please take all your medications with you for your next visit with your Primary MD  Laboratory/radiological data: Please request your Primary MD to go over all hospital tests and procedure/radiological results at the follow up, please ask your Primary MD to get all Hospital records sent to his/her office.  In some cases, they will be  blood work, cultures and biopsy results pending at the time of your discharge. Please request that your primary care M.D. goes through all the records of your hospital data and follows up on these results.  Also Note the following: If you experience worsening of your admission symptoms, develop shortness of breath, life threatening emergency, suicidal or homicidal thoughts you must seek medical attention immediately by calling 911 or calling your MD immediately  if symptoms less severe.  You must read complete instructions/literature along with all the possible adverse reactions/side effects for all the Medicines you take and that have been prescribed to you. Take any new Medicines after you have completely understood and accpet all the possible adverse reactions/side effects.   Do not drive when taking Pain medications or sleeping medications (Benzodaizepines)  Do not take more than prescribed Pain, Sleep and Anxiety Medications. It is not advisable to combine anxiety,sleep and pain medications without talking with your primary care practitioner  Special Instructions: If you have smoked or chewed Tobacco  in the last 2 yrs please stop smoking, stop any regular Alcohol  and or any Recreational drug use.  Wear Seat belts while driving.  Please note: You were cared for  by a hospitalist during your hospital stay. Once you are discharged, your primary care physician will handle any further medical issues. Please note that NO REFILLS for any discharge medications will be authorized once you are discharged, as it is imperative that you return to your primary care physician (or establish a relationship with a primary care physician if you do not have one) for your post hospital discharge needs so that they can reassess your need for medications and monitor your lab values.  Total Time spent coordinating discharge including counseling, education and face to face time equals greater than 30  minutes.  SignedBETHA Donalda Applebaum 04/16/2024 12:24 PM

## 2024-04-16 NOTE — Plan of Care (Signed)
  Problem: Education: Goal: Ability to describe self-care measures that may prevent or decrease complications (Diabetes Survival Skills Education) will improve Outcome: Progressing Goal: Individualized Educational Video(s) Outcome: Progressing   Problem: Coping: Goal: Ability to adjust to condition or change in health will improve Outcome: Progressing   Problem: Fluid Volume: Goal: Ability to maintain a balanced intake and output will improve Outcome: Progressing   Problem: Health Behavior/Discharge Planning: Goal: Ability to identify and utilize available resources and services will improve Outcome: Progressing Goal: Ability to manage health-related needs will improve Outcome: Progressing   Problem: Metabolic: Goal: Ability to maintain appropriate glucose levels will improve Outcome: Progressing   Problem: Nutritional: Goal: Maintenance of adequate nutrition will improve Outcome: Progressing Goal: Progress toward achieving an optimal weight will improve Outcome: Progressing   Problem: Skin Integrity: Goal: Risk for impaired skin integrity will decrease Outcome: Progressing   Problem: Tissue Perfusion: Goal: Adequacy of tissue perfusion will improve Outcome: Progressing   Problem: Education: Goal: Knowledge of General Education information will improve Description: Including pain rating scale, medication(s)/side effects and non-pharmacologic comfort measures Outcome: Progressing   Problem: Health Behavior/Discharge Planning: Goal: Ability to manage health-related needs will improve Outcome: Progressing   Problem: Clinical Measurements: Goal: Ability to maintain clinical measurements within normal limits will improve Outcome: Progressing Goal: Will remain free from infection Outcome: Progressing Goal: Diagnostic test results will improve Outcome: Progressing Goal: Respiratory complications will improve Outcome: Progressing Goal: Cardiovascular complication will  be avoided Outcome: Progressing   Problem: Activity: Goal: Risk for activity intolerance will decrease Outcome: Progressing   Problem: Nutrition: Goal: Adequate nutrition will be maintained Outcome: Progressing   Problem: Coping: Goal: Level of anxiety will decrease Outcome: Progressing   Problem: Elimination: Goal: Will not experience complications related to bowel motility Outcome: Progressing Goal: Will not experience complications related to urinary retention Outcome: Progressing   Problem: Pain Managment: Goal: General experience of comfort will improve and/or be controlled Outcome: Progressing   Problem: Safety: Goal: Ability to remain free from injury will improve Outcome: Progressing   Problem: Skin Integrity: Goal: Risk for impaired skin integrity will decrease Outcome: Progressing   0500 Pt resting, took pm meds without issue, pt follows commands, repositioned for comfort, provided pericare per pt request.  Pt requested bedpan at 0330, had small soft stool, repositioned in bed and went back to sleep.

## 2024-04-16 NOTE — TOC Progression Note (Signed)
 Transition of Care [facility]) - Progression Note    Patient Details  Name: Julie [name] MRN: [id number] Date of Birth: October 13, 1934  [facility] CM/SW Contact  Inocente GORMAN Kindle, LCSW Phone Number: 04/16/2024, 10:11 AM  Clinical Narrative:    [facility] confirmed they can accept patient tomorrow.    Expected Discharge Plan: Skilled Nursing Facility Barriers to Discharge: Continued Medical Work up  Expected Discharge Plan and Services In-house Referral: Clinical Social Work   Post Acute Care Choice: Skilled Nursing Facility Living arrangements for the past 2 months: Apartment                                       Social Determinants of Health (SDOH) Interventions SDOH Screenings   Food Insecurity: No Food Insecurity (04/07/2024)  Housing: Low Risk  (04/07/2024)  Transportation Needs: No Transportation Needs (04/07/2024)  Utilities: Not At Risk (04/07/2024)  Social Connections: Moderately Isolated (04/07/2024)  Tobacco Use: Medium Risk (04/06/2024)    Readmission Risk Interventions     No data to display

## 2024-04-16 NOTE — Progress Notes (Signed)
 Physical Therapy Treatment Patient Details Name: Julie [name] MRN: [id number] DOB: 18-Apr-1935 Today's Date: 04/16/2024   History of Present Illness 89 y.o. female presents to [facility] 04/06/24 with LLE pain/numbness. Pt with hematoma of L thigh and rectus femoris. Pt also with B acute PE and acute LLE DVT. 7/1 IVC filter. PMHx: hypertension, hypothyroidism, gout, GERD, CKD    PT Comments  Pt with poor tolerance to treatment today. Pt very limited by pain today. +2 Total A via helicopter method. Once seated EOB pt noted with heavy posterior lean and requesting to lay back down due to pain and fatigue. No change in DC/DME recs at this time. PT will continue to follow. Pt anticipates DC to SNF tomorrow.    If plan is discharge home, recommend the following: A lot of help with walking and/or transfers;A lot of help with bathing/dressing/bathroom;Assistance with cooking/housework;Assist for transportation;Help with stairs or ramp for entrance   Can travel by private vehicle     No  Equipment Recommendations  None recommended by PT    Recommendations for Other Services       Precautions / Restrictions Precautions Precautions: Fall Recall of Precautions/Restrictions: Intact Precaution/Restrictions Comments: LLE hematoma Restrictions Weight Bearing Restrictions Per Provider Order: No     Mobility  Bed Mobility Overal bed mobility: Needs Assistance Bed Mobility: Supine to Sit, Sit to Supine     Supine to sit: +2 for physical assistance, Total assist Sit to supine: +2 for physical assistance, Total assist   General bed mobility comments: +2 Total A via helicopter method. Once seated EOB pt noted with heavy posterior lean and requesting to lay back down due to pain and fatigue.    Transfers                   General transfer comment: declined    Ambulation/Gait               General Gait Details: unable this date   Stairs             Wheelchair Mobility      Tilt Bed    Modified Rankin (Stroke Patients Only)       Balance Overall balance assessment: Needs assistance Sitting-balance support: No upper extremity supported, Feet supported Sitting balance-Leahy Scale: Poor Sitting balance - Comments: Heavy posterior lean Postural control: Posterior lean                                  Communication Communication Communication: Impaired Factors Affecting Communication: Hearing impaired  Cognition Arousal: Alert Behavior During Therapy: WFL for tasks assessed/performed   PT - Cognitive impairments: No apparent impairments                         Following commands: Intact      Cueing Cueing Techniques: Verbal cues, Tactile cues  Exercises      General Comments General comments (skin integrity, edema, etc.): VSS      Pertinent Vitals/Pain Pain Assessment Pain Assessment: Faces Faces Pain Scale: Hurts even more Pain Location: LLE with movement Pain Descriptors / Indicators: Discomfort, Grimacing, Moaning Pain Intervention(s): Monitored during session, Limited activity within patient's tolerance, Repositioned    Home Living                          Prior Function  PT Goals (current goals can now be found in the care plan section) Progress towards PT goals: Not progressing toward goals - comment    Frequency    Min 2X/week      PT Plan      Co-evaluation              AM-PAC PT 6 Clicks Mobility   Outcome Measure  Help needed turning from your back to your side while in a flat bed without using bedrails?: A Little Help needed moving from lying on your back to sitting on the side of a flat bed without using bedrails?: A Lot Help needed moving to and from a bed to a chair (including a wheelchair)?: A Lot Help needed standing up from a chair using your arms (e.g., wheelchair or bedside chair)?: A Lot Help needed to walk in hospital room?: Total Help needed  climbing 3-5 steps with a railing? : Total 6 Click Score: 11    End of Session Equipment Utilized During Treatment: Gait belt Activity Tolerance: Patient limited by pain;Patient limited by fatigue Patient left: in bed;with call bell/phone within reach;with bed alarm set Nurse Communication: Mobility status PT Visit Diagnosis: Unsteadiness on feet (R26.81);Other abnormalities of gait and mobility (R26.89);Muscle weakness (generalized) (M62.81);History of falling (Z91.81)     Time: 8859-8847 PT Time Calculation (min) (ACUTE ONLY): 12 min  Charges:    $Therapeutic Activity: 8-22 mins PT General Charges $$ ACUTE PT VISIT: 1 Visit                     Julie [name], PT, DPT Acute Rehab Services [phone]    Julie [name] 04/16/2024, 12:18 PM

## 2024-04-16 NOTE — Progress Notes (Signed)
 PROGRESS NOTE        PATIENT DETAILS Name: Julie [name] Age: 89 y.o. Sex: female Date of Birth: 10/22/1934 Admit Date: 04/06/2024 Admitting Physician Maximino DELENA Sharps, MD ERE:Rnoopwd, Lonell, DO  Brief Summary: Patient is a 89 y.o.  female with history of HTN, hypothyroidism, GERD, advanced arthritis, chronic neuropathy-near bedbound status-who presented with sudden onset of left leg pain that woke her up around 2 am on the day of her presentation-she was evaluated in the ED-due to concern for potential ischemic LLE-she underwent CTA study-which showed rectus sheath/left thigh hematoma as well as pulmonary emboli.  She was briefly hypotensive in the ED but responded to IVF bolus. She was evaluated by PCCM-not felt to require intervention for PE-also evaluated by vascular surgery-thought to have patent vasculature bilaterally.  She was subsequently admitted to the hospitalist service.  Significant events: 6/30>> presented with sudden onset of left leg pain-found to have left rectus muscle/left thigh hematoma and PE. 7/01>> IVC filter placement 7/03>> worsening renal function-CK not elevated-renal ultrasound but is on exam most without hydronephrosis.  IV fluid started. 7/07>> risk/benefits of anticoagulation discussed with patient-IV heparin  started today 7/08>> Hb down to 8.0 (9.5 on 7/7)-heparin  discontinued.  Significant studies: 6/30>> CTA abdominal aorta with iliofemoral runoff: Partially visible pulmonary emboli, moderate size left rectus muscle hematoma, additional long segment left thigh intramuscular hematoma, advanced femoral/bilateral lower extremity calcified atherosclerosis. 6/30>> TTE: EF> 75% 7/01>> B/L LE Doppler: Acute DVT left femoral/left popliteal/left tibial/left peroneal vein 7/03>> renal ultrasound: No hydronephrosis but numerous renal cysts.  Significant microbiology data: None  Procedures: 7/01>> IVC filter  placement  Consults: Vascular surgery PCCM IR  Subjective: No major issues overnight-continues to have unchanged swelling of the abdomen.  Voiding spontaneously-Foley was removed yesterday.  Objective: Vitals: Blood pressure 100/66, pulse 73, temperature 99 F (37.2 C), temperature source Oral, resp. rate 16, height 5' 4 (1.626 m), weight 66 kg, SpO2 98%.   Exam: Awake/alert Chest: Clear to auscultation Abdomen: Soft nontender nondistended Extremity: Unchanged left thigh swelling  Pertinent Labs/Radiology:    Latest Ref Rng & Units 04/16/2024    6:33 AM 04/15/2024    4:56 AM 04/14/2024   12:36 PM  CBC  WBC 4.0 - 10.5 K/uL 9.6  11.1  14.3   Hemoglobin 12.0 - 15.0 g/dL 8.1  7.8  8.0   Hematocrit 36.0 - 46.0 % 25.1  24.0  24.0   Platelets 150 - 400 K/uL 255  214  211     Lab Results  Component Value Date   NA 136 04/16/2024   K 3.8 04/16/2024   CL 100 04/16/2024   CO2 26 04/16/2024    Assessment/Plan: Left rectus muscle hematoma/Left thigh hematoma with acute blood loss anemia Unclear etiology-denies obvious trauma-not on anticoagulation prior to this hospitalization.  Coag/platelet count stable-no prior history of epistaxis/spontaneous hematomas/bleeding history. S/p 2 units of PRBC on 7/1-Hb stable since then-heparin  infusion was started on 7/7-subsequently Hb started trending down (Hb 9.5>>8.0)-heparin  was discontinued on 7/8.  Thankfully Hb stable for the past 2 days-physical exam appears unchanged-left thigh swelling is unchanged. Patient will likely benefit from outpatient hematology evaluation for-May need further workup for unexplained left rectus muscle and left thigh hematoma.  Acute bilateral PE with left lower extremity DVT Not a candidate for anticoagulation currently S/p IVC filter placement by IR on 7/01 Probably provoked  VTE in the setting left thigh hematoma Since Hb stable for a week-with no bleeding/worsening swelling-after risk/benefits discussed with  patient on 7/7-she was started on IV heparin -unfortunately-Hb decreased down to 8.0 from 9 point on 7/8-although no overt bleeding was apparent-heparin  was discontinued.  Suspect prudent not to rechallenge her anymore at this point-would give it another week or so before starting anticoagulation.  This presumably could be [facility] at [facility].  AKI on CKD stage IIIb AKI initially felt to be hemodynamically mediated-had improved-but on 7/3-significant worsening-suspicion for contrast-induced nephropathy  (CTA study on 6/30-and IVC filter on 7/1).   Creatinine continues to improve with just supportive care-continue to follow electrolytes. Had discussed with nephrology-Dr. Dolan on 7/4-since renal function improving-will hold off on formal consultation at this point  Leukocytosis No obvious foci of infection Probably reactive-?  Inflammation around hematoma site. Monitored off antibiotics. Leukocytosis has resolved.  Hyperkalemia Secondary to worsening renal function Resolved with Lokelma  and IVF with bicarb.  Nausea/vomiting Resolved No apparent cause noted on CT imaging Supportive care with antiemetics  Constipation Stable-Had multiple BMs every other day Will keep on maintenance MiraLAX /senna for now.  PAD Per vascular surgery-has patent vasculature bilaterally No intervention recommended Unable to use aspirin Follow-up with vascular surgery in the outpatient setting  HTN BP was soft for the past several days-but now slowly creeping up AM cortisol stable BP stable on midodrine .  History of peripheral neuropathy/severe arthritis Chronic debility/deconditioning Supportive care PT/OT eval when able  Code status:   Code Status: Limited: Do not attempt resuscitation (DNR) -DNR-LIMITED -Do Not Intubate/DNI    DVT Prophylaxis: SCDs if possible-anticoagulation contraindicated due to left rectus muscle/left thigh hematoma.   Family Communication: Niece-Brenda [phone]  updated over the phone 7/5   Disposition Plan: Status is: Inpatient Remains inpatient appropriate because: Severity of illness   Planned Discharge Destination:Skilled nursing facility   Diet: Diet Order             Diet renal with fluid restriction Fluid restriction: 1200 mL Fluid; Room service appropriate? Yes; Fluid consistency: Thin  Diet effective now                     Antimicrobial agents: Anti-infectives (From admission, onward)    None        MEDICATIONS: Scheduled Meds:  Chlorhexidine  Gluconate Cloth  6 each Topical Daily   midodrine   7.5 mg Oral TID WC   pantoprazole   40 mg Oral Daily   polyethylene glycol  17 g Oral Daily   senna-docusate  2 tablet Oral BID   sodium chloride  flush  3 mL Intravenous Q12H   Continuous Infusions:    PRN Meds:.acetaminophen  **OR** acetaminophen , albuterol , dextrose , LORazepam , ondansetron  (ZOFRAN ) IV, prochlorperazine , traMADol    I have personally reviewed following labs and imaging studies  LABORATORY DATA: CBC: Recent Labs  Lab 04/13/24 0520 04/14/24 0549 04/14/24 1236 04/15/24 0456 04/16/24 0633  WBC 18.2* 14.6* 14.3* 11.1* 9.6  HGB 9.5* 8.1* 8.0* 7.8* 8.1*  HCT 28.9* 25.0* 24.0* 24.0* 25.1*  MCV 92.3 92.3 89.6 92.7 91.6  PLT 165 191 211 214 255    Basic Metabolic Panel: Recent Labs  Lab 04/11/24 0429 04/12/24 0424 04/13/24 0520 04/14/24 0549 04/15/24 0456 04/16/24 0633  NA 137 135 136 136 133* 136  K 4.1 3.8 4.1 3.8 3.3* 3.8  CL 99 97* 98 101 99 100  CO2 27 27 24 26 26 26   GLUCOSE 127* 103* 114* 95 103* 96  BUN 56* 57* 52*  52* 51* 48*  CREATININE 3.40* 3.08* 2.68* 2.31* 2.17* 1.96*  CALCIUM  7.4* 7.4* 7.8* 7.7* 7.3* 8.0*  PHOS 5.5*  --   --   --   --   --     GFR: Estimated Creatinine Clearance: 18.9 mL/min (A) (by C-G formula based on SCr of 1.96 mg/dL (H)).  Liver Function Tests: Recent Labs  Lab 04/11/24 0429  ALBUMIN 2.3*   No results for input(s): LIPASE, AMYLASE in  the last 168 hours.  No results for input(s): AMMONIA in the last 168 hours.  Coagulation Profile: No results for input(s): INR, PROTIME in the last 168 hours.   Cardiac Enzymes: No results for input(s): CKTOTAL, CKMB, CKMBINDEX, TROPONINI in the last 168 hours.   BNP (last 3 results) No results for input(s): PROBNP in the last 8760 hours.  Lipid Profile: No results for input(s): CHOL, HDL, LDLCALC, TRIG, CHOLHDL, LDLDIRECT in the last 72 hours.  Thyroid  Function Tests: No results for input(s): TSH, T4TOTAL, FREET4, T3FREE, THYROIDAB in the last 72 hours.  Anemia Panel: No results for input(s): VITAMINB12, FOLATE, FERRITIN, TIBC, IRON, RETICCTPCT in the last 72 hours.  Urine analysis:    Component Value Date/Time   COLORURINE AMBER (A) 04/10/2024 1219   APPEARANCEUR CLOUDY (A) 04/10/2024 1219   LABSPEC 1.024 04/10/2024 1219   PHURINE 5.0 04/10/2024 1219   GLUCOSEU NEGATIVE 04/10/2024 1219   HGBUR SMALL (A) 04/10/2024 1219   BILIRUBINUR NEGATIVE 04/10/2024 1219   KETONESUR NEGATIVE 04/10/2024 1219   PROTEINUR NEGATIVE 04/10/2024 1219   UROBILINOGEN 0.2 09/11/2011 0927   NITRITE NEGATIVE 04/10/2024 1219   LEUKOCYTESUR SMALL (A) 04/10/2024 1219    Sepsis Labs: Lactic Acid, Venous    Component Value Date/Time   LATICACIDVEN 3.3 (HH) 04/06/2024 2014    MICROBIOLOGY: No results found for this or any previous visit (from the past 240 hours).  RADIOLOGY STUDIES/RESULTS: No results found.    LOS: 10 days   Donalda Applebaum, MD  [facility]    To contact the attending provider between 7A-7P or the covering provider during after hours 7P-7A, please log into the web site [email or web address] and access using universal [place] password for that web site. If you do not have the password, please call the hospital operator.  04/16/2024, 11:35 AM

## 2024-04-17 DIAGNOSIS — M109 Gout, unspecified: Secondary | ICD-10-CM | POA: Diagnosis not present

## 2024-04-17 DIAGNOSIS — E039 Hypothyroidism, unspecified: Secondary | ICD-10-CM | POA: Diagnosis not present

## 2024-04-17 DIAGNOSIS — R531 Weakness: Secondary | ICD-10-CM | POA: Diagnosis not present

## 2024-04-17 DIAGNOSIS — S7012XA Contusion of left thigh, initial encounter: Secondary | ICD-10-CM | POA: Diagnosis not present

## 2024-04-17 DIAGNOSIS — I739 Peripheral vascular disease, unspecified: Secondary | ICD-10-CM | POA: Diagnosis not present

## 2024-04-17 DIAGNOSIS — D649 Anemia, unspecified: Secondary | ICD-10-CM | POA: Diagnosis not present

## 2024-04-17 DIAGNOSIS — N1832 Chronic kidney disease, stage 3b: Secondary | ICD-10-CM | POA: Diagnosis not present

## 2024-04-17 DIAGNOSIS — I10 Essential (primary) hypertension: Secondary | ICD-10-CM | POA: Diagnosis not present

## 2024-04-17 DIAGNOSIS — K219 Gastro-esophageal reflux disease without esophagitis: Secondary | ICD-10-CM | POA: Diagnosis not present

## 2024-04-17 DIAGNOSIS — I998 Other disorder of circulatory system: Secondary | ICD-10-CM

## 2024-04-17 DIAGNOSIS — S7012XS Contusion of left thigh, sequela: Secondary | ICD-10-CM | POA: Diagnosis not present

## 2024-04-17 DIAGNOSIS — I2699 Other pulmonary embolism without acute cor pulmonale: Secondary | ICD-10-CM | POA: Diagnosis not present

## 2024-04-17 DIAGNOSIS — K279 Peptic ulcer, site unspecified, unspecified as acute or chronic, without hemorrhage or perforation: Secondary | ICD-10-CM | POA: Diagnosis not present

## 2024-04-17 DIAGNOSIS — S7012XD Contusion of left thigh, subsequent encounter: Secondary | ICD-10-CM | POA: Diagnosis not present

## 2024-04-17 DIAGNOSIS — K259 Gastric ulcer, unspecified as acute or chronic, without hemorrhage or perforation: Secondary | ICD-10-CM | POA: Diagnosis not present

## 2024-04-17 DIAGNOSIS — D72829 Elevated white blood cell count, unspecified: Secondary | ICD-10-CM | POA: Diagnosis not present

## 2024-04-17 DIAGNOSIS — E119 Type 2 diabetes mellitus without complications: Secondary | ICD-10-CM | POA: Diagnosis not present

## 2024-04-17 DIAGNOSIS — I959 Hypotension, unspecified: Secondary | ICD-10-CM | POA: Diagnosis not present

## 2024-04-17 DIAGNOSIS — Z7401 Bed confinement status: Secondary | ICD-10-CM | POA: Diagnosis not present

## 2024-04-17 DIAGNOSIS — R2689 Other abnormalities of gait and mobility: Secondary | ICD-10-CM | POA: Diagnosis not present

## 2024-04-17 DIAGNOSIS — M6281 Muscle weakness (generalized): Secondary | ICD-10-CM | POA: Diagnosis not present

## 2024-04-17 DIAGNOSIS — M7981 Nontraumatic hematoma of soft tissue: Secondary | ICD-10-CM | POA: Diagnosis not present

## 2024-04-17 DIAGNOSIS — Z743 Need for continuous supervision: Secondary | ICD-10-CM | POA: Diagnosis not present

## 2024-04-17 DIAGNOSIS — S301XXS Contusion of abdominal wall, sequela: Secondary | ICD-10-CM | POA: Diagnosis not present

## 2024-04-17 LAB — GLUCOSE, CAPILLARY
Glucose-Capillary: 85 mg/dL (ref 70–99)
Glucose-Capillary: 100 mg/dL — ABNORMAL HIGH (ref 70–99)

## 2024-04-17 NOTE — TOC Progression Note (Addendum)
 Transition of Care [facility]) - Progression Note    Patient Details  Name: Julie [name] MRN: [id number] Date of Birth: 18-Jun-1935  Transition of Care [facility]) CM/SW Contact  Inocente GORMAN Kindle, LCSW Phone Number: 04/17/2024, 8:32 AM  Clinical Narrative:    8:32am-Sent discharge summary to [facility]. Awaiting confirmation from facility on timing as they are waiting on anther patient to discharge from that room.   9:57 AM-[facility] able to receive patient at 12pm. CSW updated patient's niece, Erminio, who is assisting to complete the SNF paperwork.   Expected Discharge Plan: Skilled Nursing Facility Barriers to Discharge: Barriers Resolved  Expected Discharge Plan and Services In-house Referral: Clinical Social Work   Post Acute Care Choice: Skilled Nursing Facility Living arrangements for the past 2 months: Apartment Expected Discharge Date: 04/17/24                                     Social Determinants of Health (SDOH) Interventions SDOH Screenings   Food Insecurity: No Food Insecurity (04/07/2024)  Housing: Low Risk  (04/07/2024)  Transportation Needs: No Transportation Needs (04/07/2024)  Utilities: Not At Risk (04/07/2024)  Social Connections: Moderately Isolated (04/07/2024)  Tobacco Use: Medium Risk (04/06/2024)    Readmission Risk Interventions     No data to display

## 2024-04-17 NOTE — Progress Notes (Signed)
 Occupational Therapy Treatment Patient Details Name: Julie [name] MRN: [id number] DOB: July 26, 1935 Today's Date: 04/17/2024   History of present illness 89 y.o. female presents to [facility] 04/06/24 with LLE pain/numbness. Pt with hematoma of L thigh and rectus femoris. Pt also with B acute PE and acute LLE DVT. 7/1 IVC filter. PMHx: hypertension, hypothyroidism, gout, GERD, CKD   OT comments  Patient received in supine and agreeable to work with OT on EOB but declined transfer to recliner. Patient required increased time and mod assist to get to EOB. Patient able to perform grooming seated on EOB and tolerated 10 minutes before asking to return to supine. Max assist to return to supine due to assistance with trunk and BLEs. Patient will benefit from continued inpatient follow up therapy, <3 hours/day. Acute OT to continue to follow to address established goals to facilitate DC to next venue of care.        If plan is discharge home, recommend the following:  A lot of help with walking and/or transfers;A little help with bathing/dressing/bathroom;Assistance with cooking/housework;Assist for transportation;Help with stairs or ramp for entrance   Equipment Recommendations  Other (comment) (defer)    Recommendations for Other Services      Precautions / Restrictions Precautions Precautions: Fall Recall of Precautions/Restrictions: Intact Precaution/Restrictions Comments: LLE hematoma Restrictions Weight Bearing Restrictions Per Provider Order: No       Mobility Bed Mobility Overal bed mobility: Needs Assistance Bed Mobility: Supine to Sit, Sit to Supine     Supine to sit: Mod assist Sit to supine: Max assist   General bed mobility comments: increased time due to LLE pain and assistance with BLE and trunk.    Transfers Overall transfer level: Needs assistance                 General transfer comment: declined     Balance Overall balance assessment: Needs  assistance Sitting-balance support: No upper extremity supported, Feet supported Sitting balance-Leahy Scale: Poor Sitting balance - Comments: supervision to CGA for sitting balance                                   ADL either performed or assessed with clinical judgement   ADL Overall ADL's : Needs assistance/impaired     Grooming: Wash/dry hands;Wash/dry face;Minimal assistance;Sitting Grooming Details (indicate cue type and reason): on EOB                               General ADL Comments: focused on bed mobility and sitting balance/tolerance    Extremity/Trunk Assessment              Vision       Perception     Praxis     Communication Communication Communication: Impaired Factors Affecting Communication: Hearing impaired   Cognition Arousal: Alert Behavior During Therapy: WFL for tasks assessed/performed Cognition: No apparent impairments             OT - Cognition Comments: Pleasant and cooperative                 Following commands: Intact        Cueing   Cueing Techniques: Verbal cues, Tactile cues  Exercises      Shoulder Instructions       General Comments VSS    Pertinent Vitals/ Pain  Pain Assessment Pain Assessment: Faces Faces Pain Scale: Hurts even more Pain Location: LLE with movement Pain Descriptors / Indicators: Discomfort, Grimacing, Moaning Pain Intervention(s): Limited activity within patient's tolerance, Monitored during session, Repositioned  Home Living                                          Prior Functioning/Environment              Frequency  Min 2X/week        Progress Toward Goals  OT Goals(current goals can now be found in the care plan section)  Progress towards OT goals: Progressing toward goals  Acute Rehab OT Goals Patient Stated Goal: less leg pain OT Goal Formulation: With patient Time For Goal Achievement: 04/22/24 Potential  to Achieve Goals: Good ADL Goals Pt Will Perform Upper Body Dressing: with set-up;with supervision Pt Will Perform Lower Body Dressing: with set-up;with supervision Pt Will Transfer to Toilet: with supervision;ambulating Pt Will Perform Toileting - Clothing Manipulation and hygiene: with set-up;with supervision  Plan      Co-evaluation                 AM-PAC OT 6 Clicks Daily Activity     Outcome Measure   Help from another person eating meals?: A Little Help from another person taking care of personal grooming?: A Little Help from another person toileting, which includes using toliet, bedpan, or urinal?: A Lot Help from another person bathing (including washing, rinsing, drying)?: A Lot Help from another person to put on and taking off regular upper body clothing?: A Little Help from another person to put on and taking off regular lower body clothing?: A Lot 6 Click Score: 15    End of Session    OT Visit Diagnosis: Unsteadiness on feet (R26.81);Other abnormalities of gait and mobility (R26.89);Muscle weakness (generalized) (M62.81);Pain Pain - Right/Left: Left Pain - part of body: Leg   Activity Tolerance Patient tolerated treatment well   Patient Left in bed;with call bell/phone within reach   Nurse Communication Mobility status        Time: 9245-9179 OT Time Calculation (min): 26 min  Charges: OT General Charges $OT Visit: 1 Visit OT Treatments $Self Care/Home Management : 8-22 mins $Therapeutic Activity: 8-22 mins  Dick [name], OTA Acute Rehabilitation Services  Office [phone]   Julie [name] 04/17/2024, 11:50 AM

## 2024-04-17 NOTE — Progress Notes (Signed)
 Attempted to call ptar. Kept getting busy signal

## 2024-04-17 NOTE — TOC Transition Note (Signed)
 Transition of Care [facility]) - Discharge Note   Patient Details  Name: Julie [name] MRN: [id number] Date of Birth: 09-02-1935  [facility]) CM/SW Contact:  Inocente GORMAN Kindle, LCSW Phone Number: 04/17/2024, 12:18 PM   Clinical Narrative:    Patient will DC to: [facility] Anticipated DC date: 04/17/24 Family notified: Erminio, niece Transport by: [place]   Per MD patient ready for DC to [facility]. RN to call report prior to discharge [phone] room 120). RN, patient, patient's family, and facility notified of DC. Discharge Summary and FL2 sent to facility. DC packet on chart including signed scripts and DNR. Ambulance transport requested for patient.   CSW will sign off for now as social work intervention is no longer needed. Please consult us  again if new needs arise.     Final next level of care: Skilled Nursing Facility Barriers to Discharge: Barriers Resolved   Patient Goals and CMS Choice Patient states their goals for this hospitalization and ongoing recovery are:: Rehab CMS Medicare.gov Compare Post Acute Care list provided to:: Patient Choice offered to / list presented to : Patient [place] ownership interest in [facility].provided to:: Patient    Discharge Placement   Existing PASRR number confirmed : 04/17/24          Patient [facility] bed at: [facility] Patient to be transferred to facility by: [facility] Name of family member notified: Erminio Patient and family notified of of transfer: 04/17/24  Discharge Plan and Services Additional resources added to the After Visit Summary for   In-house Referral: Clinical Social Work   Post Acute Care Choice: Skilled Nursing Facility                               Social Drivers of Health (SDOH) Interventions SDOH Screenings   Food Insecurity: No Food Insecurity (04/07/2024)  Housing: Low Risk  (04/07/2024)  Transportation Needs: No Transportation Needs (04/07/2024)  Utilities: Not At  Risk (04/07/2024)  Social Connections: Moderately Isolated (04/07/2024)  Tobacco Use: Medium Risk (04/06/2024)     Readmission Risk Interventions     No data to display

## 2024-04-17 NOTE — Plan of Care (Signed)
  Problem: Education: Goal: Ability to describe self-care measures that may prevent or decrease complications (Diabetes Survival Skills Education) will improve Outcome: Progressing Goal: Individualized Educational Video(s) Outcome: Progressing   Problem: Coping: Goal: Ability to adjust to condition or change in health will improve Outcome: Progressing   Problem: Fluid Volume: Goal: Ability to maintain a balanced intake and output will improve Outcome: Progressing   Problem: Health Behavior/Discharge Planning: Goal: Ability to identify and utilize available resources and services will improve Outcome: Progressing Goal: Ability to manage health-related needs will improve Outcome: Progressing   Problem: Metabolic: Goal: Ability to maintain appropriate glucose levels will improve Outcome: Progressing   Problem: Nutritional: Goal: Maintenance of adequate nutrition will improve Outcome: Progressing Goal: Progress toward achieving an optimal weight will improve Outcome: Progressing   Problem: Skin Integrity: Goal: Risk for impaired skin integrity will decrease Outcome: Progressing   Problem: Tissue Perfusion: Goal: Adequacy of tissue perfusion will improve Outcome: Progressing   Problem: Education: Goal: Knowledge of General Education information will improve Description: Including pain rating scale, medication(s)/side effects and non-pharmacologic comfort measures Outcome: Progressing   Problem: Health Behavior/Discharge Planning: Goal: Ability to manage health-related needs will improve Outcome: Progressing   Problem: Clinical Measurements: Goal: Ability to maintain clinical measurements within normal limits will improve Outcome: Progressing Goal: Will remain free from infection Outcome: Progressing Goal: Diagnostic test results will improve Outcome: Progressing Goal: Respiratory complications will improve Outcome: Progressing Goal: Cardiovascular complication will  be avoided Outcome: Progressing   Problem: Activity: Goal: Risk for activity intolerance will decrease Outcome: Progressing   Problem: Nutrition: Goal: Adequate nutrition will be maintained Outcome: Progressing   Problem: Coping: Goal: Level of anxiety will decrease Outcome: Progressing   Problem: Elimination: Goal: Will not experience complications related to bowel motility Outcome: Progressing Goal: Will not experience complications related to urinary retention Outcome: Progressing   Problem: Pain Managment: Goal: General experience of comfort will improve and/or be controlled Outcome: Progressing   Problem: Safety: Goal: Ability to remain free from injury will improve Outcome: Progressing   Problem: Skin Integrity: Goal: Risk for impaired skin integrity will decrease Outcome: Progressing  0500 Pt requested bedpan ad had small amount liquid stool, cleaned up, Pericare done, Purewick replaced an sheets changed.  Cloudy yellow urine noted in cannister, pt encouraged to take more po fluids.

## 2024-04-22 DIAGNOSIS — S7012XD Contusion of left thigh, subsequent encounter: Secondary | ICD-10-CM | POA: Diagnosis not present

## 2024-04-22 DIAGNOSIS — I2699 Other pulmonary embolism without acute cor pulmonale: Secondary | ICD-10-CM | POA: Diagnosis not present

## 2024-04-22 DIAGNOSIS — R2689 Other abnormalities of gait and mobility: Secondary | ICD-10-CM | POA: Diagnosis not present

## 2024-04-22 DIAGNOSIS — M6281 Muscle weakness (generalized): Secondary | ICD-10-CM | POA: Diagnosis not present

## 2024-04-24 DIAGNOSIS — I959 Hypotension, unspecified: Secondary | ICD-10-CM | POA: Diagnosis not present

## 2024-04-24 DIAGNOSIS — M109 Gout, unspecified: Secondary | ICD-10-CM | POA: Diagnosis not present

## 2024-04-24 DIAGNOSIS — K219 Gastro-esophageal reflux disease without esophagitis: Secondary | ICD-10-CM | POA: Diagnosis not present

## 2024-05-08 DEATH — deceased

## 2024-06-18 ENCOUNTER — Other Ambulatory Visit: Payer: Self-pay

## 2024-06-18 DIAGNOSIS — I739 Peripheral vascular disease, unspecified: Secondary | ICD-10-CM

## 2024-07-17 ENCOUNTER — Ambulatory Visit

## 2024-07-17 ENCOUNTER — Encounter (HOSPITAL_COMMUNITY)
